# Patient Record
Sex: Female | Born: 1985
Health system: Southern US, Community
[De-identification: ages and names within clinical notes are randomized; demographics above are authoritative.]

## PROBLEM LIST (undated history)

## (undated) ENCOUNTER — Inpatient Hospital Stay (HOSPITAL_COMMUNITY): Payer: Self-pay

## (undated) DIAGNOSIS — O139 Gestational [pregnancy-induced] hypertension without significant proteinuria, unspecified trimester: Secondary | ICD-10-CM

## (undated) DIAGNOSIS — A599 Trichomoniasis, unspecified: Secondary | ICD-10-CM

## (undated) HISTORY — DX: Trichomoniasis, unspecified: A59.9

## (undated) HISTORY — PX: NO PAST SURGERIES: SHX2092

## (undated) HISTORY — DX: Gestational (pregnancy-induced) hypertension without significant proteinuria, unspecified trimester: O13.9

---
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
## 2006-03-05 ENCOUNTER — Emergency Department: Payer: Self-pay | Admitting: Emergency Medicine

## 2006-05-09 ENCOUNTER — Emergency Department: Payer: Self-pay | Admitting: General Practice

## 2006-05-11 ENCOUNTER — Emergency Department: Payer: Self-pay | Admitting: Unknown Physician Specialty

## 2006-06-25 ENCOUNTER — Emergency Department: Payer: Self-pay | Admitting: Emergency Medicine

## 2006-08-03 ENCOUNTER — Emergency Department: Payer: Self-pay | Admitting: Emergency Medicine

## 2006-09-09 ENCOUNTER — Emergency Department: Payer: Self-pay | Admitting: Emergency Medicine

## 2006-09-10 ENCOUNTER — Emergency Department: Payer: Self-pay | Admitting: Emergency Medicine

## 2007-07-06 ENCOUNTER — Emergency Department (HOSPITAL_COMMUNITY): Admission: EM | Admit: 2007-07-06 | Discharge: 2007-07-06 | Payer: Self-pay | Admitting: Emergency Medicine

## 2007-07-07 ENCOUNTER — Emergency Department: Payer: Self-pay | Admitting: Emergency Medicine

## 2008-02-17 ENCOUNTER — Emergency Department: Payer: Self-pay | Admitting: Emergency Medicine

## 2009-05-19 ENCOUNTER — Emergency Department: Payer: Self-pay | Admitting: Emergency Medicine

## 2009-09-06 ENCOUNTER — Emergency Department: Payer: Self-pay | Admitting: Emergency Medicine

## 2010-06-14 ENCOUNTER — Emergency Department: Payer: Self-pay | Admitting: Unknown Physician Specialty

## 2011-05-20 ENCOUNTER — Emergency Department: Payer: Self-pay | Admitting: Emergency Medicine

## 2011-12-01 ENCOUNTER — Emergency Department (HOSPITAL_COMMUNITY)
Admission: EM | Admit: 2011-12-01 | Discharge: 2011-12-02 | Disposition: A | Discharge status: Home / Self Care | Payer: Self-pay | Attending: Emergency Medicine | Admitting: Emergency Medicine

## 2011-12-01 ENCOUNTER — Encounter: Payer: Self-pay | Admitting: Emergency Medicine

## 2011-12-01 DIAGNOSIS — J45909 Unspecified asthma, uncomplicated: Secondary | ICD-10-CM | POA: Insufficient documentation

## 2011-12-01 DIAGNOSIS — R21 Rash and other nonspecific skin eruption: Secondary | ICD-10-CM | POA: Insufficient documentation

## 2011-12-01 DIAGNOSIS — H6092 Unspecified otitis externa, left ear: Secondary | ICD-10-CM

## 2011-12-01 DIAGNOSIS — H9209 Otalgia, unspecified ear: Secondary | ICD-10-CM | POA: Insufficient documentation

## 2011-12-01 DIAGNOSIS — R509 Fever, unspecified: Secondary | ICD-10-CM | POA: Insufficient documentation

## 2011-12-01 DIAGNOSIS — H60399 Other infective otitis externa, unspecified ear: Secondary | ICD-10-CM | POA: Insufficient documentation

## 2011-12-01 NOTE — ED Notes (Signed)
PT. REPORTS LEFT EAR PAIN X 2 DAYS , DENIES INJURY , OCCASIONAL DRAINAGE .

## 2011-12-02 MED ORDER — CIPROFLOXACIN-DEXAMETHASONE 0.3-0.1 % OT SUSP: 4.0000 [drp] | Freq: Two times a day (BID) | OTIC | Status: AC

## 2011-12-02 MED ORDER — HYDROCODONE-ACETAMINOPHEN 7.5-325 MG/15ML PO SOLN: 15.0000 mL | ORAL | Status: AC | PRN

## 2011-12-02 MED ORDER — HYDROCODONE-ACETAMINOPHEN 5-325 MG PO TABS
1.0000 | ORAL_TABLET | Freq: Once | ORAL | Status: AC
  Administered 2011-12-02: 1 via ORAL
  Filled 2011-12-02: qty 1

## 2011-12-02 NOTE — ED Notes (Signed)
Rx given x2 D/c instructions reviewed w/ pt - pt denies any further questions or concerns at present.   

## 2011-12-06 NOTE — ED Provider Notes (Signed)
History     CSN: [id number] Arrival date & time: 12/01/2011 10:54 PM   First MD Initiated Contact with Patient 12/02/11 0202      Chief Complaint  Patient presents with  . Otalgia     Patient is a 25 y.o. female presenting with ear pain. The history is provided by the patient.  Otalgia This is a new problem. The current episode started 2 days ago. There is pain in the left ear. The problem occurs constantly. The problem has been gradually worsening. The maximum temperature recorded prior to her arrival was 100 to 100.9 F. The pain is at a severity of 8/10. The pain is moderate. Her past medical history does not include hearing loss.  Reports 2 day hx of (L) ear pain.   Past Medical History  Diagnosis Date  . Asthma     History reviewed. No pertinent past surgical history.  No family history on file.  History  Substance Use Topics  . Smoking status: Current Everyday Smoker  . Smokeless tobacco: Not on file  . Alcohol Use: No    OB History    Grav Para Term Preterm Abortions TAB SAB Ect Mult Living                  Review of Systems  Constitutional: Negative.   HENT: Positive for ear pain.   Eyes: Negative.   Respiratory: Negative.   Cardiovascular: Negative.   Gastrointestinal: Negative.   Genitourinary: Negative.   Musculoskeletal: Negative.   Skin: Negative.   Neurological: Negative.   Hematological: Negative.   Psychiatric/Behavioral: Negative.     Allergies  Sulfa antibiotics  Home Medications   Current Outpatient Rx  Name Route Sig Dispense Refill  . CIPROFLOXACIN-DEXAMETHASONE 0.3-0.1 % OT SUSP Left Ear Place 4 drops into the left ear 2 (two) times daily. For 7-10 days 7.5 mL 0  . HYDROCODONE-ACETAMINOPHEN 7.5-325 MG/15ML PO SOLN Oral Take 15 mLs by mouth every 4 (four) hours as needed for pain. 50 mL 0    BP 112/58  Pulse 66  Temp 97.9 F (36.6 C)  Resp 18  SpO2 100%  LMP 11/20/2011  Physical Exam  Constitutional: She is oriented to  person, place, and time. She appears well-developed and well-nourished.  HENT:  Head: Normocephalic and atraumatic.  Right Ear: Hearing, tympanic membrane, external ear and ear canal normal.  Left Ear: Hearing normal. Tympanic membrane is erythematous.  Eyes: Conjunctivae are normal.  Neck: Neck supple.  Cardiovascular: Normal rate and regular rhythm.   Pulmonary/Chest: Effort normal and breath sounds normal.  Abdominal: Soft. Bowel sounds are normal.  Musculoskeletal: Normal range of motion.  Neurological: She is alert and oriented to person, place, and time.  Skin: Skin is warm and dry. Rash noted. Rash is papular. No erythema.  Psychiatric: She has a normal mood and affect.    ED Course  Procedures Findings and impression discussed w/ pt. Will plan for d/c hoem w/ tx for (L) OM and encourage close f/u w/ her PCP in [place] is not improving. Pt agreeable w/ plan.  Labs Reviewed - No data to display No results found.   1. Otitis externa of left ear       MDM  (L) OM        Leanne Chang, NP 12/06/11 [phone]

## 2011-12-08 NOTE — ED Provider Notes (Signed)
Medical screening examination/treatment/procedure(s) were performed by non-physician practitioner and as supervising physician I was immediately available for consultation/collaboration.   Shelda Jakes, MD 12/08/11 [phone]

## 2012-12-18 ENCOUNTER — Encounter (HOSPITAL_COMMUNITY): Payer: Self-pay | Admitting: Emergency Medicine

## 2012-12-18 ENCOUNTER — Emergency Department (HOSPITAL_COMMUNITY)
Admission: EM | Admit: 2012-12-18 | Discharge: 2012-12-18 | Disposition: A | Discharge status: Home / Self Care | Payer: Self-pay | Attending: Emergency Medicine | Admitting: Emergency Medicine

## 2012-12-18 DIAGNOSIS — L259 Unspecified contact dermatitis, unspecified cause: Secondary | ICD-10-CM | POA: Insufficient documentation

## 2012-12-18 DIAGNOSIS — R109 Unspecified abdominal pain: Secondary | ICD-10-CM | POA: Insufficient documentation

## 2012-12-18 DIAGNOSIS — F17200 Nicotine dependence, unspecified, uncomplicated: Secondary | ICD-10-CM | POA: Insufficient documentation

## 2012-12-18 DIAGNOSIS — J45909 Unspecified asthma, uncomplicated: Secondary | ICD-10-CM | POA: Insufficient documentation

## 2012-12-18 NOTE — ED Provider Notes (Signed)
History   This chart was scribed for Nelia Shi, MD, by Frederik Pear, ER scribe. The patient was seen in room TR06C/TR06C and the patient's care was started at 1105.    CSN: [id number]  Arrival date & time 12/18/12  1105   None     Chief Complaint  Patient presents with  . Rash    (Consider location/radiation/quality/duration/timing/severity/associated sxs/prior treatment) HPI  Christie Ali is a 26 y.o. female who presents to the Emergency Department complaining of a constant, gradually worsening rash with associated 10/10 itching and pain to the lower abdomen that has since radiated to the legs, back, and arms and began 7-8 months ago. She states that she saw her PCP several months ago and was diagnosed with an allergy to nickel. She states she has been treating the area with hydrocortisone cream with no relief. She has an appointment with a dermatologist in [place] in 03/14.   Past Medical History  Diagnosis Date  . Asthma     History reviewed. No pertinent past surgical history.  No family history on file.  History  Substance Use Topics  . Smoking status: Current Every Day Smoker  . Smokeless tobacco: Not on file  . Alcohol Use: No    OB History    Grav Para Term Preterm Abortions TAB SAB Ect Mult Living                  Review of Systems A complete 10 system review of systems was obtained and all systems are negative except as noted in the HPI and PMH.   Allergies  Other and Sulfa antibiotics  Home Medications  No current outpatient prescriptions on file.  BP 107/62  Pulse 73  Temp 97.5 F (36.4 C) (Oral)  Resp 18  SpO2 100%  Physical Exam  Nursing note and vitals reviewed. Constitutional: She is oriented to person, place, and time. She appears well-developed and well-nourished. No distress.  HENT:  Head: Normocephalic and atraumatic.  Eyes: Pupils are equal, round, and reactive to light.  Neck: Normal range of motion.    Cardiovascular: Normal rate and intact distal pulses.   Pulmonary/Chest: No respiratory distress.  Abdominal: Normal appearance. She exhibits no distension.  Musculoskeletal: Normal range of motion.  Neurological: She is alert and oriented to person, place, and time. No cranial nerve deficit.  Skin: Skin is warm and dry. Rash noted. Rash is maculopapular. Rash is not pustular and not urticarial.     Psychiatric: She has a normal mood and affect. Her behavior is normal.    ED Course  Procedures (including critical care time)  DIAGNOSTIC STUDIES: Oxygen Saturation is 100% on room air, normal by my interpretation.    COORDINATION OF CARE:  11:33- Discussed planned course of treatment with the patient, including nightly dressings and continuation of the hydrocortisone cream, until she can see the dermatologist, who is agreeable at this time.   Labs Reviewed - No data to display No results found.   1. Contact dermatitis       MDM  May be contact from metal in jeans.  Will recommend to wear sweatpants only and use occlusive dressing      Nelia Shi, MD 12/18/12 2216

## 2012-12-18 NOTE — ED Notes (Signed)
Dry, patchy rash to abdomen, back and legs. Has appointment with Derm in March.

## 2012-12-18 NOTE — ED Notes (Signed)
Onset 6 months ago seen Primary Doctor for rash on abdominal area and now radiating to legs back and arms.  Stated Doctor told patient allergy to nickel given medication however not resolving. Itching 10/10 and soreness.

## 2012-12-18 NOTE — Discharge Instructions (Signed)
Use occlusive dressings as I directed every night   Contact Dermatitis Contact dermatitis is a reaction to certain substances that touch the skin. Contact dermatitis can be either irritant contact dermatitis or allergic contact dermatitis. Irritant contact dermatitis does not require previous exposure to the substance for a reaction to occur.Allergic contact dermatitis only occurs if you have been exposed to the substance before. Upon a repeat exposure, your body reacts to the substance.  CAUSES  Many substances can cause contact dermatitis. Irritant dermatitis is most commonly caused by repeated exposure to mildly irritating substances, such as:  Makeup.  Soaps.  Detergents.  Bleaches.  Acids.  Metal salts, such as nickel. Allergic contact dermatitis is most commonly caused by exposure to:  Poisonous plants.  Chemicals (deodorants, shampoos).  Jewelry.  Latex.  Neomycin in triple antibiotic cream.  Preservatives in products, including clothing. SYMPTOMS  The area of skin that is exposed may develop:  Dryness or flaking.  Redness.  Cracks.  Itching.  Pain or a burning sensation.  Blisters. With allergic contact dermatitis, there may also be swelling in areas such as the eyelids, mouth, or genitals.  DIAGNOSIS  Your caregiver can usually tell what the problem is by doing a physical exam. In cases where the cause is uncertain and an allergic contact dermatitis is suspected, a patch skin test may be performed to help determine the cause of your dermatitis. TREATMENT Treatment includes protecting the skin from further contact with the irritating substance by avoiding that substance if possible. Barrier creams, powders, and gloves may be helpful. Your caregiver may also recommend:  Steroid creams or ointments applied 2 times daily. For best results, soak the rash area in cool water for 20 minutes. Then apply the medicine. Cover the area with a plastic wrap. You can  store the steroid cream in the refrigerator for a "chilly" effect on your rash. That may decrease itching. Oral steroid medicines may be needed in more severe cases.  Antibiotics or antibacterial ointments if a skin infection is present.  Antihistamine lotion or an antihistamine taken by mouth to ease itching.  Lubricants to keep moisture in your skin.  Burow's solution to reduce redness and soreness or to dry a weeping rash. Mix one packet or tablet of solution in 2 cups cool water. Dip a clean washcloth in the mixture, wring it out a bit, and put it on the affected area. Leave the cloth in place for 30 minutes. Do this as often as possible throughout the day.  Taking several cornstarch or baking soda baths daily if the area is too large to cover with a washcloth. Harsh chemicals, such as alkalis or acids, can cause skin damage that is like a burn. You should flush your skin for 15 to 20 minutes with cold water after such an exposure. You should also seek immediate medical care after exposure. Bandages (dressings), antibiotics, and pain medicine may be needed for severely irritated skin.  HOME CARE INSTRUCTIONS  Avoid the substance that caused your reaction.  Keep the area of skin that is affected away from hot water, soap, sunlight, chemicals, acidic substances, or anything else that would irritate your skin.  Do not scratch the rash. Scratching may cause the rash to become infected.  You may take cool baths to help stop the itching.  Only take over-the-counter or prescription medicines as directed by your caregiver.  See your caregiver for follow-up care as directed to make sure your skin is healing properly. SEEK MEDICAL CARE  IF:   Your condition is not better after 3 days of treatment.  You seem to be getting worse.  You see signs of infection such as swelling, tenderness, redness, soreness, or warmth in the affected area.  You have any problems related to your  medicines. Document Released: 12/03/2000 Document Revised: 02/28/2012 Document Reviewed: 05/11/2011 [facility] Patient Information 2013 [place], [place].

## 2014-01-18 ENCOUNTER — Emergency Department: Payer: Self-pay | Admitting: Emergency Medicine

## 2014-01-18 LAB — MONONUCLEOSIS SCREEN: Mono Test: NEGATIVE

## 2014-01-20 LAB — BETA STREP CULTURE(ARMC)

## 2014-03-20 ENCOUNTER — Emergency Department: Payer: Self-pay | Admitting: Internal Medicine

## 2014-05-26 ENCOUNTER — Emergency Department (HOSPITAL_COMMUNITY): Payer: Self-pay

## 2014-05-26 ENCOUNTER — Encounter (HOSPITAL_COMMUNITY): Payer: Self-pay | Admitting: Emergency Medicine

## 2014-05-26 ENCOUNTER — Emergency Department (HOSPITAL_COMMUNITY)
Admission: EM | Admit: 2014-05-26 | Discharge: 2014-05-26 | Disposition: A | Discharge status: Home / Self Care | Payer: Self-pay | Attending: Emergency Medicine | Admitting: Emergency Medicine

## 2014-05-26 DIAGNOSIS — J069 Acute upper respiratory infection, unspecified: Secondary | ICD-10-CM | POA: Insufficient documentation

## 2014-05-26 DIAGNOSIS — Z882 Allergy status to sulfonamides status: Secondary | ICD-10-CM | POA: Insufficient documentation

## 2014-05-26 DIAGNOSIS — F17200 Nicotine dependence, unspecified, uncomplicated: Secondary | ICD-10-CM | POA: Insufficient documentation

## 2014-05-26 DIAGNOSIS — J45901 Unspecified asthma with (acute) exacerbation: Secondary | ICD-10-CM | POA: Insufficient documentation

## 2014-05-26 LAB — RAPID STREP SCREEN (MED CTR MEBANE ONLY): STREPTOCOCCUS, GROUP A SCREEN (DIRECT): NEGATIVE

## 2014-05-26 MED ORDER — PREDNISONE 20 MG PO TABS: ORAL_TABLET | ORAL | Status: DC

## 2014-05-26 MED ORDER — HYDROCODONE-ACETAMINOPHEN 7.5-325 MG/15ML PO SOLN: 10.0000 mL | Freq: Four times a day (QID) | ORAL | Status: DC | PRN

## 2014-05-26 MED ORDER — ALBUTEROL SULFATE (2.5 MG/3ML) 0.083% IN NEBU
5.0000 mg | INHALATION_SOLUTION | Freq: Once | RESPIRATORY_TRACT | Status: AC
  Administered 2014-05-26: 5 mg via RESPIRATORY_TRACT
  Filled 2014-05-26: qty 6

## 2014-05-26 MED ORDER — ALBUTEROL SULFATE HFA 108 (90 BASE) MCG/ACT IN AERS
2.0000 | INHALATION_SPRAY | RESPIRATORY_TRACT | Status: DC | PRN
  Administered 2014-05-26: 2 via RESPIRATORY_TRACT
  Filled 2014-05-26: qty 6.7

## 2014-05-26 MED ORDER — HYDROCODONE-ACETAMINOPHEN 7.5-325 MG/15ML PO SOLN
10.0000 mL | Freq: Once | ORAL | Status: AC
  Administered 2014-05-26: 10 mL via ORAL
  Filled 2014-05-26: qty 15

## 2014-05-26 NOTE — ED Notes (Signed)
Per pt sts she has been congested and coughing the last few days. Productive cough. sts some fever.

## 2014-05-26 NOTE — ED Provider Notes (Signed)
CSN: [id number]     Arrival date & time 05/26/14  0735 History   First MD Initiated Contact with Patient 05/26/14 [phone]     Chief Complaint  Patient presents with  . URI  . Cough     (Consider location/radiation/quality/duration/timing/severity/associated sxs/prior Treatment) HPI  28 year old female with history of asthma who presents complaining of chest congestions and productive cough. Patient reports for the past 3-4 days she has had nasal congestion, sore throat, cough productive with yellow sputum and now having some increased shortness of breath and wheezing. She reports subjective fever and chills at home. Symptoms not improved with over-the-counter medication. No associated nausea vomiting diarrhea no abdominal pain no back pain no dysuria and no rash. She is an occasional smoker. She reports that asthma is well-controlled and has not had an exacerbation in for 5 years. No prior history of intubation and ICU stay. No prior history of PE DVT, recent surgery, prolonged bed rest, no history of cancer, no unilateral leg swelling or calf pain, and does not take birth control pill. Denies hemoptysis.   Past Medical History  Diagnosis Date  . Asthma    History reviewed. No pertinent past surgical history. History reviewed. No pertinent family history. History  Substance Use Topics  . Smoking status: Current Every Day Smoker  . Smokeless tobacco: Not on file  . Alcohol Use: No   OB History   Grav Para Term Preterm Abortions TAB SAB Ect Mult Living                 Review of Systems  All other systems reviewed and are negative.     Allergies  Sulfa antibiotics and Other  Home Medications   Prior to Admission medications   Medication Sig Start Date End Date Taking? Authorizing Provider  Phenylephrine-DM-GG-APAP (TYLENOL COLD/FLU SEVERE PO) Take 10 mLs by mouth every 4 (four) hours as needed (flu like symptoms).   Yes Historical Provider, MD   BP 114/73  Pulse 85   Temp(Src) 98.4 F (36.9 C)  Resp 20  Wt 160 lb (72.576 kg)  SpO2 98% Physical Exam  Nursing note and vitals reviewed. Constitutional: She appears well-developed and well-nourished. No distress.  HENT:  Head: Atraumatic.  Ears with normal TMs bilaterally, nose with normal nares, throat with uvula is midline, no tonsillar enlargement or exudates, mild posterior oropharyngeal erythema, no evidence of deep tissue infection, no trismus.    Eyes: Conjunctivae are normal.  Neck: Neck supple. No JVD present.  Cardiovascular:  Tachycardia without murmurs rubs or gallop  Pulmonary/Chest: She has no rales. She exhibits no tenderness.  Faint inspiratory wheezes without rales or rhonchi.  Abdominal: Soft. There is no tenderness.  Lymphadenopathy:    She has no cervical adenopathy.  Neurological: She is alert.  Skin: No rash noted.  Psychiatric: She has a normal mood and affect.    ED Course  Procedures (including critical care time)  8:56 AM Patient with URI symptoms which may exacerbate her asthma. She has very mild wheezes on exam which seems to improve after receiving breathing treatments. She is afebrile and not hypoxic. Chest x-ray ordered, strep test ordered, cough medication given.  11:31 AM Strep test is negative, chest x-ray shows no evidence of focal infiltrate concerning for pneumonia. Patient likely having an asthma exacerbation secondary to a URI. She is not hypoxic, and lung sounds much better after initial breathing treatment. Patient will be discharged with a course of steroids, cough medication and a  rescue inhaler to use as needed. Return precautions discussed. Patient voiced understanding and agrees with plan  Labs Review Labs Reviewed  RAPID STREP SCREEN  CULTURE, GROUP A STREP    Imaging Review Dg Chest 2 View (if Patient Has Fever And/or Copd)  05/26/2014   CLINICAL DATA:  Cough and upper respiratory infection.  EXAM: CHEST  2 VIEW  COMPARISON:  None.  FINDINGS: The  heart size and mediastinal contours are within normal limits. Both lungs are clear. The visualized skeletal structures are unremarkable.  IMPRESSION: No active cardiopulmonary disease.   Electronically Signed   By: Marin Olp M.D.   On: 05/26/2014 10:06     EKG Interpretation None      MDM   Final diagnoses:  Mild asthma exacerbation  URI (upper respiratory infection)    BP 125/71  Pulse 70  Temp(Src) 99 F (37.2 C) (Oral)  Resp 16  Wt 160 lb (72.576 kg)  SpO2 98%  I have reviewed nursing notes and vital signs. I personally reviewed the imaging tests through PACS system  I reviewed available ER/hospitalization records thought the EMR     Domenic Moras, [place] 05/26/14 1133

## 2014-05-26 NOTE — Discharge Instructions (Signed)
Please use your albuterol inhaler with 2 puffs every 4 hours as needed for shortness of breath or wheezing. Take Hycet for cough but be aware that this medication can cause drowsiness.  Take steroid as prescribed.  Avoid smoking cigarette.   Asthma, Acute Bronchospasm Acute bronchospasm caused by asthma is also referred to as an asthma attack. Bronchospasm means your air passages become narrowed. The narrowing is caused by inflammation and tightening of the muscles in the air tubes (bronchi) in your lungs. This can make it hard to breath or cause you to wheeze and cough. CAUSES Possible triggers are:  Animal dander from the skin, hair, or feathers of animals.  Dust mites contained in house dust.  Cockroaches.  Pollen from trees or grass.  Mold.  Cigarette or tobacco smoke.  Air pollutants such as dust, household cleaners, hair sprays, aerosol sprays, paint fumes, strong chemicals, or strong odors.  Cold air or weather changes. Cold air may trigger inflammation. Winds increase molds and pollens in the air.  Strong emotions such as crying or laughing hard.  Stress.  Certain medicines such as aspirin or beta-blockers.  Sulfites in foods and drinks, such as dried fruits and wine.  Infections or inflammatory conditions, such as a flu, cold, or inflammation of the nasal membranes (rhinitis).  Gastroesophageal reflux disease (GERD). GERD is a condition where stomach acid backs up into your throat (esophagus).  Exercise or strenuous activity. SIGNS AND SYMPTOMS   Wheezing.  Excessive coughing, particularly at night.  Chest tightness.  Shortness of breath. DIAGNOSIS  Your health care provider will ask you about your medical history and perform a physical exam. A chest X-ray or blood testing may be performed to look for other causes of your symptoms or other conditions that may have triggered your asthma attack. TREATMENT  Treatment is aimed at reducing inflammation and opening  up the airways in your lungs. Most asthma attacks are treated with inhaled medicines. These include quick relief or rescue medicines (such as bronchodilators) and controller medicines (such as inhaled corticosteroids). These medicines are sometimes given through an inhaler or a nebulizer. Systemic steroid medicine taken by mouth or given through an IV tube also can be used to reduce the inflammation when an attack is moderate or severe. Antibiotic medicines are only used if a bacterial infection is present.  HOME CARE INSTRUCTIONS   Rest.  Drink plenty of liquids. This helps the mucus to remain thin and be easily coughed up. Only use caffeine in moderation and do not use alcohol until you have recovered from your illness.  Do not smoke. Avoid being exposed to secondhand smoke.  You play a critical role in keeping yourself in good health. Avoid exposure to things that cause you to wheeze or to have breathing problems.  Keep your medicines up to date and available. Carefully follow your health care provider's treatment plan.  Take your medicine exactly as prescribed.  When pollen or pollution is bad, keep windows closed and use an air conditioner or go to places with air conditioning.  Asthma requires careful medical care. See your health care provider for a follow-up as advised. If you are more than [redacted] weeks pregnant and you were prescribed any new medicines, let your obstetrician know about the visit and how you are doing. Follow-up with your health care provider as directed.  After you have recovered from your asthma attack, make an appointment with your outpatient doctor to talk about ways to reduce the likelihood of future  attacks. If you do not have a doctor who manages your asthma, make an appointment with a primary care doctor to discuss your asthma. SEEK IMMEDIATE MEDICAL CARE IF:   You are getting worse.  You have trouble breathing. If severe, call your local emergency services (911  in the [place]  You develop chest pain or discomfort.  You are vomiting.  You are not able to keep fluids down.  You are coughing up yellow, green, brown, or bloody sputum.  You have a fever and your symptoms suddenly get worse.  You have trouble swallowing. MAKE SURE YOU:   Understand these instructions.  Will watch your condition.  Will get help right away if you are not doing well or get worse. Document Released: 03/23/2007 Document Revised: 08/08/2013 Document Reviewed: 06/13/2013 [facility] Patient Information 2014 [place], [place].  Upper Respiratory Infection, Adult An upper respiratory infection (URI) is also known as the common cold. It is often caused by a type of germ (virus). Colds are easily spread (contagious). You can pass it to others by kissing, coughing, sneezing, or drinking out of the same glass. Usually, you get better in 1 or 2 weeks.  HOME CARE   Only take medicine as told by your doctor.  Use a warm mist humidifier or breathe in steam from a hot shower.  Drink enough water and fluids to keep your pee (urine) clear or pale yellow.  Get plenty of rest.  Return to work when your temperature is back to normal or as told by your doctor. You may use a face mask and wash your hands to stop your cold from spreading. GET HELP RIGHT AWAY IF:   After the first few days, you feel you are getting worse.  You have questions about your medicine.  You have chills, shortness of breath, or brown or red spit (mucus).  You have yellow or brown snot (nasal discharge) or pain in the face, especially when you bend forward.  You have a fever, puffy (swollen) neck, pain when you swallow, or white spots in the back of your throat.  You have a bad headache, ear pain, sinus pain, or chest pain.  You have a high-pitched whistling sound when you breathe in and out (wheezing).  You have a lasting cough or cough up blood.  You have sore muscles or a stiff neck. MAKE SURE  YOU:   Understand these instructions.  Will watch your condition.  Will get help right away if you are not doing well or get worse. Document Released: 05/24/2008 Document Revised: 02/28/2012 Document Reviewed: 04/12/2011 [facility] Patient Information 2014 [place], [place].

## 2014-05-27 NOTE — ED Provider Notes (Signed)
Medical screening examination/treatment/procedure(s) were performed by non-physician practitioner and as supervising physician I was immediately available for consultation/collaboration.  Neta Ehlers, MD 05/27/14 [phone]

## 2014-05-28 LAB — CULTURE, GROUP A STREP

## 2014-08-03 ENCOUNTER — Emergency Department: Payer: Self-pay | Admitting: Emergency Medicine

## 2014-08-03 LAB — COMPREHENSIVE METABOLIC PANEL
ALBUMIN: 4.1 g/dL (ref 3.4–5.0)
ALK PHOS: 68 U/L
ANION GAP: 9 (ref 7–16)
BUN: 9 mg/dL (ref 7–18)
Bilirubin,Total: 2.1 mg/dL — ABNORMAL HIGH (ref 0.2–1.0)
CALCIUM: 8.8 mg/dL (ref 8.5–10.1)
Chloride: 107 mmol/L (ref 98–107)
Co2: 25 mmol/L (ref 21–32)
Creatinine: 1.23 mg/dL (ref 0.60–1.30)
GLUCOSE: 88 mg/dL (ref 65–99)
OSMOLALITY: 279 (ref 275–301)
POTASSIUM: 3.8 mmol/L (ref 3.5–5.1)
SGOT(AST): 18 U/L (ref 15–37)
SGPT (ALT): 14 U/L
Sodium: 141 mmol/L (ref 136–145)
Total Protein: 8.1 g/dL (ref 6.4–8.2)

## 2014-08-03 LAB — CBC WITH DIFFERENTIAL/PLATELET
BASOS ABS: 0.1 10*3/uL (ref 0.0–0.1)
BASOS PCT: 0.9 %
Eosinophil #: 0.3 10*3/uL (ref 0.0–0.7)
Eosinophil %: 3.1 %
HCT: 46 % (ref 35.0–47.0)
HGB: 15.2 g/dL (ref 12.0–16.0)
LYMPHS ABS: 2.8 10*3/uL (ref 1.0–3.6)
LYMPHS PCT: 28.4 %
MCH: 32.2 pg (ref 26.0–34.0)
MCHC: 33 g/dL (ref 32.0–36.0)
MCV: 98 fL (ref 80–100)
MONO ABS: 0.9 x10 3/mm (ref 0.2–0.9)
Monocyte %: 9.1 %
NEUTROS ABS: 5.8 10*3/uL (ref 1.4–6.5)
NEUTROS PCT: 58.5 %
PLATELETS: 229 10*3/uL (ref 150–440)
RBC: 4.72 10*6/uL (ref 3.80–5.20)
RDW: 12.7 % (ref 11.5–14.5)
WBC: 10 10*3/uL (ref 3.6–11.0)

## 2014-08-03 LAB — URINALYSIS, COMPLETE
Bilirubin,UR: NEGATIVE
Blood: NEGATIVE
Glucose,UR: NEGATIVE mg/dL (ref 0–75)
NITRITE: NEGATIVE
PH: 5 (ref 4.5–8.0)
Protein: 30
RBC,UR: 24 /HPF (ref 0–5)
SPECIFIC GRAVITY: 1.024 (ref 1.003–1.030)

## 2014-08-03 LAB — PREGNANCY, URINE: PREGNANCY TEST, URINE: NEGATIVE m[IU]/mL

## 2015-01-23 ENCOUNTER — Emergency Department (HOSPITAL_COMMUNITY)
Admission: EM | Admit: 2015-01-23 | Discharge: 2015-01-23 | Disposition: A | Discharge status: Home / Self Care | Payer: Self-pay | Attending: Emergency Medicine | Admitting: Emergency Medicine

## 2015-01-23 DIAGNOSIS — L03115 Cellulitis of right lower limb: Secondary | ICD-10-CM | POA: Insufficient documentation

## 2015-01-23 DIAGNOSIS — Z720 Tobacco use: Secondary | ICD-10-CM | POA: Insufficient documentation

## 2015-01-23 DIAGNOSIS — J45909 Unspecified asthma, uncomplicated: Secondary | ICD-10-CM | POA: Insufficient documentation

## 2015-01-23 MED ORDER — DOXYCYCLINE HYCLATE 100 MG PO CAPS: 100.0000 mg | ORAL_CAPSULE | Freq: Two times a day (BID) | ORAL | Status: DC

## 2015-01-23 MED ORDER — HYDROCODONE-ACETAMINOPHEN 5-325 MG PO TABS: 1.0000 | ORAL_TABLET | Freq: Four times a day (QID) | ORAL | Status: DC | PRN

## 2015-01-23 NOTE — ED Notes (Signed)
Pt presents with redness, swelling and warmth to right knee x1 day. Raised center. Denies injury or bite.

## 2015-01-23 NOTE — ED Provider Notes (Signed)
CSN: [id number]     Arrival date & time 01/23/15  0741 History   First MD Initiated Contact with Patient 01/23/15 [phone]     Chief Complaint  Patient presents with  . Insect Bite     (Consider location/radiation/quality/duration/timing/severity/associated sxs/prior Treatment) HPI  Patient presents to the emergency department with redness to the area just above her knee.  The patient states that she thinks that the area may have been a little red and inflamed, but hit her knee at work yesterday and that is when she noticed more pain.  Patient states that she has not had any fever, nausea, vomiting, weakness, dizziness, headache, blurred vision, back pain, neck pain or syncope.  The patient states that she did not take any medications prior to arrival.  She did not use any treatments on the area Past Medical History  Diagnosis Date  . Asthma    No past surgical history on file. No family history on file. History  Substance Use Topics  . Smoking status: Current Every Day Smoker  . Smokeless tobacco: Not on file  . Alcohol Use: No   OB History    No data available     Review of Systems All other systems negative except as documented in the HPI. All pertinent positives and negatives as reviewed in the HPI.    Allergies  Sulfa antibiotics and Other  Home Medications   Prior to Admission medications   Medication Sig Start Date End Date Taking? Authorizing Provider  acetaminophen (TYLENOL) 325 MG tablet Take 650 mg by mouth every 6 (six) hours as needed for mild pain or headache.   Yes Historical Provider, MD  medroxyPROGESTERone (DEPO-PROVERA) 150 MG/ML injection Inject 150 mg into the muscle every 3 (three) months.   Yes Historical Provider, MD  HYDROcodone-acetaminophen (HYCET) 7.5-325 mg/15 ml solution Take 10 mLs by mouth 4 (four) times daily as needed for moderate pain. Patient not taking: Reported on 01/23/2015 05/26/14   Domenic Moras, PA-C  predniSONE (DELTASONE) 20 MG tablet 3  tabs po day one, then 2 tabs daily x 4 days Patient not taking: Reported on 01/23/2015 05/26/14   Domenic Moras, PA-C   BP 122/67 mmHg  Pulse 60  Temp(Src) 98.2 F (36.8 C) (Oral)  Resp 20  SpO2 97% Physical Exam  Constitutional: She is oriented to person, place, and time. She appears well-developed and well-nourished. No distress.  HENT:  Head: Normocephalic and atraumatic.  Eyes: Pupils are equal, round, and reactive to light.  Cardiovascular: Normal rate, regular rhythm and intact distal pulses.  Exam reveals no gallop and no friction rub.   No murmur heard. Pulmonary/Chest: Effort normal and breath sounds normal.  Neurological: She is alert and oriented to person, place, and time. She exhibits normal muscle tone. Coordination normal.  Skin: Skin is warm and dry.     Nursing note and vitals reviewed.   ED Course  Procedures (including critical care time) Return here as needed. The patient will be treated for cellulitis and told to return here for any worsening in your condition. Use ice and heat on the area.   MDM   Final diagnoses:  None      Brent General, PA-C 01/23/15 2440  Evelina Bucy, MD 01/25/15 [phone]

## 2015-01-23 NOTE — ED Notes (Signed)
Pt alert x4 respirations easy non labored skin w/d

## 2015-01-23 NOTE — Discharge Instructions (Signed)
Return here as needed. Follow up with a primary doctor. Use heat on the area.

## 2015-08-06 ENCOUNTER — Encounter (HOSPITAL_COMMUNITY): Payer: Self-pay | Admitting: Emergency Medicine

## 2015-08-06 ENCOUNTER — Emergency Department (HOSPITAL_COMMUNITY)
Admission: EM | Admit: 2015-08-06 | Discharge: 2015-08-06 | Disposition: A | Discharge status: Home / Self Care | Payer: BLUE CROSS/BLUE SHIELD | Attending: Emergency Medicine | Admitting: Emergency Medicine

## 2015-08-06 DIAGNOSIS — Y92007 Garden or yard of unspecified non-institutional (private) residence as the place of occurrence of the external cause: Secondary | ICD-10-CM | POA: Insufficient documentation

## 2015-08-06 DIAGNOSIS — J45909 Unspecified asthma, uncomplicated: Secondary | ICD-10-CM | POA: Diagnosis not present

## 2015-08-06 DIAGNOSIS — Z3202 Encounter for pregnancy test, result negative: Secondary | ICD-10-CM | POA: Insufficient documentation

## 2015-08-06 DIAGNOSIS — S90862A Insect bite (nonvenomous), left foot, initial encounter: Secondary | ICD-10-CM | POA: Insufficient documentation

## 2015-08-06 DIAGNOSIS — Z793 Long term (current) use of hormonal contraceptives: Secondary | ICD-10-CM | POA: Diagnosis not present

## 2015-08-06 DIAGNOSIS — Y9389 Activity, other specified: Secondary | ICD-10-CM | POA: Insufficient documentation

## 2015-08-06 DIAGNOSIS — Z720 Tobacco use: Secondary | ICD-10-CM | POA: Diagnosis not present

## 2015-08-06 DIAGNOSIS — W57XXXA Bitten or stung by nonvenomous insect and other nonvenomous arthropods, initial encounter: Secondary | ICD-10-CM | POA: Diagnosis not present

## 2015-08-06 DIAGNOSIS — Z792 Long term (current) use of antibiotics: Secondary | ICD-10-CM | POA: Insufficient documentation

## 2015-08-06 DIAGNOSIS — G44209 Tension-type headache, unspecified, not intractable: Secondary | ICD-10-CM | POA: Insufficient documentation

## 2015-08-06 DIAGNOSIS — Y999 Unspecified external cause status: Secondary | ICD-10-CM | POA: Diagnosis not present

## 2015-08-06 LAB — POC URINE PREG, ED: PREG TEST UR: NEGATIVE

## 2015-08-06 MED ORDER — KETOROLAC TROMETHAMINE 60 MG/2ML IM SOLN
30.0000 mg | Freq: Once | INTRAMUSCULAR | Status: AC
  Administered 2015-08-06: 30 mg via INTRAMUSCULAR
  Filled 2015-08-06: qty 2

## 2015-08-06 MED ORDER — BUTALBITAL-APAP-CAFFEINE 50-325-40 MG PO TABS: 1.0000 | ORAL_TABLET | Freq: Four times a day (QID) | ORAL | Status: AC | PRN

## 2015-08-06 MED ORDER — DOXYCYCLINE HYCLATE 100 MG PO CAPS: 100.0000 mg | ORAL_CAPSULE | Freq: Two times a day (BID) | ORAL | Status: DC

## 2015-08-06 NOTE — ED Notes (Signed)
Pt states she has had a migraine for 4 to 5 days, she states has been taking ibuprofen 800 mg for pain. She took took it this morning at 0300 a.m.Marland Kitchen She also c/o proximal toes itching, swelling and painful. She states she had a tick bite 2 weeks ago.

## 2015-08-06 NOTE — ED Provider Notes (Signed)
CSN: [id number]     Arrival date & time 08/06/15  0929 History  This chart was scribed for non-physician practitioner, Domenic Moras, PA-C, working with Noemi Chapel, MD by Ladene Artist, ED Scribe. This patient was seen in room TR08C/TR08C and the patient's care was started at 10:22 AM.   Chief Complaint  Patient presents with  . Migraine   The history is provided by the patient. No language interpreter was used.   HPI Comments: Christie Ali is a 29 y.o. female who presents to the Emergency Department complaining of  migraine for the past 4-5 days. Pt describes pain as a constant, throbbing sensation with associated photophobia. She reports migraines every day upon waking for the past 4-5 days that lasts 3- minutes-1 hour. Pt went to work this morning but left after nausea and 1 episode of emesis. She has tried ibuprofen and rest without significant relief. Pt denies neck stiffness, rash, abdominal pain. She denies possibility of pregnancy; recently started depo at the beginning of August. Pt has seen her PCP Sabino Snipes, MD with [facility] for frequent HAs.   Pt also presents with a tick bite 2 weeks ago to left foot while doing yard work. She noticed a tick embedded between her left second and third toe the next morning. Pt was able to remove the tick. She also reports multiple mosquito bites to her left toes a few days later and has noticed associated swelling to the mosquito bites this morning. No treatments tried PTA.    Past Medical History  Diagnosis Date  . Asthma    History reviewed. No pertinent past surgical history. History reviewed. No pertinent family history. Social History  Substance Use Topics  . Smoking status: Current Every Day Smoker  . Smokeless tobacco: None  . Alcohol Use: No   OB History    No data available     Review of Systems  Eyes: Positive for photophobia.  Gastrointestinal: Positive for nausea and vomiting. Negative for abdominal pain.   Musculoskeletal: Negative for neck stiffness.  Skin: Negative for rash.  Neurological: Positive for headaches.   Allergies  Sulfa antibiotics and Other  Home Medications   Prior to Admission medications   Medication Sig Start Date End Date Taking? Authorizing Provider  acetaminophen (TYLENOL) 325 MG tablet Take 650 mg by mouth every 6 (six) hours as needed for mild pain or headache.    Historical Provider, MD  doxycycline (VIBRAMYCIN) 100 MG capsule Take 1 capsule (100 mg total) by mouth 2 (two) times daily. 01/23/15   Dalia Heading, PA-C  HYDROcodone-acetaminophen (NORCO/VICODIN) 5-325 MG per tablet Take 1 tablet by mouth every 6 (six) hours as needed for moderate pain. 01/23/15   Dalia Heading, PA-C  medroxyPROGESTERone (DEPO-PROVERA) 150 MG/ML injection Inject 150 mg into the muscle every 3 (three) months.    Historical Provider, MD  predniSONE (DELTASONE) 20 MG tablet 3 tabs po day one, then 2 tabs daily x 4 days Patient not taking: Reported on 01/23/2015 05/26/14   Domenic Moras, PA-C   BP 94/62 mmHg  Pulse 74  Temp(Src) 98.4 F (36.9 C) (Oral)  Resp 22  Wt 150 lb (68.04 kg)  SpO2 97%  LMP  Physical Exam  Constitutional: She is oriented to person, place, and time. She appears well-developed and well-nourished. No distress.  HENT:  Head: Normocephalic and atraumatic.  Eyes: Conjunctivae and EOM are normal.  Neck: Neck supple. No rigidity. No tracheal deviation present.  No nuchal rigidity  Cardiovascular:  Normal rate.   Pulses:      Dorsalis pedis pulses are 2+ on the left side.  Pulmonary/Chest: Effort normal. No respiratory distress.  Musculoskeletal: Normal range of motion.  L foot: mild erythema noted between web space of 2nd and 3rd toes with mild tenderness. No abscess. Pedal pulse 2+. Normal L ankle.  Neurological: She is alert and oriented to person, place, and time. She has normal strength. No cranial nerve deficit or sensory deficit. GCS eye subscore is 4. GCS  verbal subscore is 5. GCS motor subscore is 6.  No focal neuro deficit. Cranial nerves 3-12 grossly intact. GCS score is 15.   Skin: Skin is warm and dry.  Psychiatric: She has a normal mood and affect. Her behavior is normal.  Nursing note and vitals reviewed.  ED Course  Procedures (including critical care time) DIAGNOSTIC STUDIES: Oxygen Saturation is 97% on RA, normal by my interpretation.    COORDINATION OF CARE: 10:29 AM-Discussed treatment plan which includes Toradol injection with pt at bedside and pt agreed to plan.   11:40 AM Tick bite 2 weeks ago, here with headache and also rash at the site of the bite on L foot.  No erythema migran concerning for Lyme disease.  No focal neuro deficit on exam.  However, given risk factors of potential tick bite vector, pt will be receiving doxy.  Pt to f/u with PCP  Labs Review Labs Reviewed - No data to display  Imaging Review No results found. I have personally reviewed and evaluated these images and lab results as part of my medical decision-making.   EKG Interpretation None      MDM   Final diagnoses:  Tick bite of left foot, initial encounter  Tension-type headache, not intractable, unspecified chronicity pattern    BP 94/62 mmHg  Pulse 74  Temp(Src) 98.4 F (36.9 C) (Oral)  Resp 22  Wt 150 lb (68.04 kg)  SpO2 97%  LMP    I personally performed the services described in this documentation, which was scribed in my presence. The recorded information has been reviewed and is accurate.     Domenic Moras, PA-C 08/06/15 1141  Noemi Chapel, MD 08/06/15 2250

## 2015-08-06 NOTE — Discharge Instructions (Signed)
Tick Bite Information Ticks are insects that attach themselves to the skin. There are many types of ticks. Common types include wood ticks and deer ticks. Sometimes, ticks carry diseases that can make a person very ill. The most common places for ticks to attach themselves are the scalp, neck, armpits, waist, and groin.  HOW CAN YOU PREVENT TICK BITES? Take these steps to help prevent tick bites when you are outdoors:  Wear long sleeves and long pants.  Wear white clothes so you can see ticks more easily.  Tuck your pant legs into your socks.  If walking on a trail, stay in the middle of the trail to avoid brushing against bushes.  Avoid walking through areas with long grass.  Put bug spray on all skin that is showing and along boot tops, pant legs, and sleeve cuffs.  Check clothes, hair, and skin often and before going inside.  Brush off any ticks that are not attached.  Take a shower or bath as soon as possible after being outdoors. HOW SHOULD YOU REMOVE A TICK? Ticks should be removed as soon as possible to help prevent diseases. 1. If latex gloves are available, put them on before trying to remove a tick. 2. Use tweezers to grasp the tick as close to the skin as possible. You may also use curved forceps or a tick removal tool. Grasp the tick as close to its head as possible. Avoid grasping the tick on its body. 3. Pull gently upward until the tick lets go. Do not twist the tick or jerk it suddenly. This may break off the tick's head or mouth parts. 4. Do not squeeze or crush the tick's body. This could force disease-carrying fluids from the tick into your body. 5. After the tick is removed, wash the bite area and your hands with soap and water or alcohol. 6. Apply a small amount of antiseptic cream or ointment to the bite site. 7. Wash any tools that were used. Do not try to remove a tick by applying a hot match, petroleum jelly, or fingernail polish to the tick. These methods do  not work. They may also increase the chances of disease being spread from the tick bite. WHEN SHOULD YOU SEEK HELP? Contact your health care provider if you are unable to remove a tick or if a part of the tick breaks off in the skin. After a tick bite, you need to watch for signs and symptoms of diseases that can be spread by ticks. Contact your health care provider if you develop any of the following:  Fever.  Rash.  Redness and puffiness (swelling) in the area of the tick bite.  Tender, puffy lymph glands.  Watery poop (diarrhea).  Weight loss.  Cough.  Feeling more tired than normal (fatigue).  Muscle, joint, or bone pain.  Belly (abdominal) pain.  Headache.  Change in your level of consciousness.  Trouble walking or moving your legs.  Loss of feeling (numbness) in the legs.  Loss of movement (paralysis).  Shortness of breath.  Confusion.  Throwing up (vomiting) many times. Document Released: 03/02/2010 Document Revised: 08/08/2013 Document Reviewed: 05/16/2013 Exit[employer] Patient Information 2015 [employer]. This information is not intended to replace advice given to you by your health care provider. Make sure you discuss any questions you have with your health care provider.  

## 2016-05-25 ENCOUNTER — Emergency Department
Admission: EM | Admit: 2016-05-25 | Discharge: 2016-05-25 | Disposition: A | Discharge status: Home / Self Care | Payer: BLUE CROSS/BLUE SHIELD | Attending: Emergency Medicine | Admitting: Emergency Medicine

## 2016-05-25 DIAGNOSIS — F17200 Nicotine dependence, unspecified, uncomplicated: Secondary | ICD-10-CM | POA: Insufficient documentation

## 2016-05-25 DIAGNOSIS — Z79899 Other long term (current) drug therapy: Secondary | ICD-10-CM | POA: Insufficient documentation

## 2016-05-25 DIAGNOSIS — X58XXXA Exposure to other specified factors, initial encounter: Secondary | ICD-10-CM | POA: Insufficient documentation

## 2016-05-25 DIAGNOSIS — Y939 Activity, unspecified: Secondary | ICD-10-CM | POA: Insufficient documentation

## 2016-05-25 DIAGNOSIS — Y999 Unspecified external cause status: Secondary | ICD-10-CM | POA: Insufficient documentation

## 2016-05-25 DIAGNOSIS — J45909 Unspecified asthma, uncomplicated: Secondary | ICD-10-CM | POA: Insufficient documentation

## 2016-05-25 DIAGNOSIS — Y929 Unspecified place or not applicable: Secondary | ICD-10-CM | POA: Insufficient documentation

## 2016-05-25 DIAGNOSIS — S0501XA Injury of conjunctiva and corneal abrasion without foreign body, right eye, initial encounter: Secondary | ICD-10-CM

## 2016-05-25 MED ORDER — FLUORESCEIN SODIUM 1 MG OP STRP
ORAL_STRIP | OPHTHALMIC | Status: AC
  Filled 2016-05-25: qty 1

## 2016-05-25 MED ORDER — HYDROCODONE-ACETAMINOPHEN 5-325 MG PO TABS: 1.0000 | ORAL_TABLET | ORAL | Status: DC | PRN

## 2016-05-25 MED ORDER — TOBRAMYCIN 0.3 % OP SOLN: 1.0000 [drp] | OPHTHALMIC | Status: AC

## 2016-05-25 MED ORDER — EYE WASH OPHTH SOLN
OPHTHALMIC | Status: AC
  Filled 2016-05-25: qty 118

## 2016-05-25 MED ORDER — TETRACAINE HCL 0.5 % OP SOLN
1.0000 [drp] | Freq: Once | OPHTHALMIC | Status: DC
  Filled 2016-05-25: qty 2

## 2016-05-25 NOTE — ED Notes (Signed)
Poked in eye with a finger 2 days ago.  Reports vision is blurry in right eye.

## 2016-05-25 NOTE — Discharge Instructions (Signed)
Corneal Abrasion °The cornea is the clear covering at the front and center of the eye. When you look at the colored portion of the eye, you are looking through the cornea. It is a thin tissue made up of layers. The top layer is the most sensitive layer. A corneal abrasion happens if this layer is scratched or an injury causes it to come off.  °HOME CARE °· You may be given drops or a medicated cream. Use the medicine as told by your doctor. °· A pressure patch may be put over the eye. If this is done, follow your doctor's instructions for when to remove the patch. Do not drive or use machines while the eye patch is on. Judging distances is hard to do with a patch on. °· See your doctor for a follow-up exam if you are told to do so. It is very important that you keep this appointment. °GET HELP IF:  °· You have pain, are sensitive to light, and have a scratchy feeling in one eye or both eyes. °· Your pressure patch keeps getting loose. You can blink your eye under the patch. °· You have fluid coming from your eye or the lids stick together in the morning. °· You have the same symptoms in the morning that you did with the first abrasion. This could be days, weeks, or months after the first abrasion healed. °  °This information is not intended to replace advice given to you by your health care provider. Make sure you discuss any questions you have with your health care provider. °  °Document Released: 05/24/2008 Document Revised: 08/27/2015 Document Reviewed: 08/13/2013 °Elsevier Interactive Patient Education ©2016 [employer] ° °

## 2016-05-25 NOTE — ED Provider Notes (Signed)
[facility] Emergency Department Provider Note  ____________________________________________  Time seen: Approximately 10:21 AM  I have reviewed the triage vital signs and the nursing notes.   HISTORY  Chief Complaint Eye Pain    HPI Christie Ali is a 30 y.o. female presents for evaluation of right eye pain after being poked in the eye 2 days ago. Patient reports that her eyes extremely painful to look at the light. It is becoming red and tearful for the last 2 days.   Past Medical History  Diagnosis Date  . Asthma     There are no active problems to display for this patient.   No past surgical history on file.  Current Outpatient Rx  Name  Route  Sig  Dispense  Refill  . acetaminophen (TYLENOL) 325 MG tablet   Oral   Take 650 mg by mouth every 6 (six) hours as needed for mild pain or headache.         . butalbital-acetaminophen-caffeine (FIORICET) 50-325-40 MG per tablet   Oral   Take 1-2 tablets by mouth every 6 (six) hours as needed for headache.   20 tablet   0   . doxycycline (VIBRAMYCIN) 100 MG capsule   Oral   Take 1 capsule (100 mg total) by mouth 2 (two) times daily.   20 capsule   0   . HYDROcodone-acetaminophen (NORCO) 5-325 MG tablet   Oral   Take 1-2 tablets by mouth every 4 (four) hours as needed for moderate pain.   15 tablet   0   . medroxyPROGESTERone (DEPO-PROVERA) 150 MG/ML injection   Intramuscular   Inject 150 mg into the muscle every 3 (three) months.         . tobramycin (TOBREX) 0.3 % ophthalmic solution   Both Eyes   Place 1 drop into both eyes every 4 (four) hours.   5 mL   0     Allergies Sulfa antibiotics and Other  No family history on file.  Social History Social History  Substance Use Topics  . Smoking status: Current Every Day Smoker  . Smokeless tobacco: Not on file  . Alcohol Use: No    Review of Systems Constitutional: No fever/chills Eyes: Decreased vision right eye.  Positive for right eye pain. Skin: Negative for rash. Neurological: Negative for headaches, focal weakness or numbness.  10-point ROS otherwise negative.  ____________________________________________   PHYSICAL EXAM:  VITAL SIGNS: ED Triage Vitals  Enc Vitals Group     BP 05/25/16 0957 108/72 mmHg     Pulse Rate 05/25/16 0957 89     Resp 05/25/16 0957 18     Temp 05/25/16 0957 98.3 F (36.8 C)     Temp Source 05/25/16 0957 Oral     SpO2 05/25/16 0957 100 %     Weight 05/25/16 0957 130 lb (58.968 kg)     Height 05/25/16 0957 5\' 7"  (1.702 m)     Head Cir --      Peak Flow --      Pain Score 05/25/16 0956 7     Pain Loc --      Pain Edu? --      Excl. in [place]? --     Constitutional: Alert and oriented. Well appearing and in no acute distress. Eyes: Right conjunctival very erythematous and tearing. Nose equal round reactive to light and accommodation. Head: Atraumatic.  Neurologic:  Normal speech and language. No gross focal neurologic deficits are appreciated. No gait  instability. Skin:  Skin is warm, dry and intact. No rash noted. Psychiatric: Mood and affect are normal. Speech and behavior are normal.  ____________________________________________   LABS (all labs ordered are listed, but only abnormal results are displayed)  Labs Reviewed - No data to display ____________________________________________  EKG   ____________________________________________  RADIOLOGY   ____________________________________________   PROCEDURES  Procedure(s) performed: yes  Tetracaine eyedrops placed in the right eye. Fluorescein strip used. Visualized patient's cornea with a abrasion noted at about 7:00 on the right outer aspect of the right eye. Eye irrigated with 20 cc of normal saline. She tolerated procedure well with no complaints.  Critical Care performed: No  ____________________________________________   INITIAL IMPRESSION / ASSESSMENT AND PLAN / ED  COURSE  Pertinent labs & imaging results that were available during my care of the patient were reviewed by me and considered in my medical decision making (see chart for details).  Acute corneal abrasion right eye. Rx given for Tobrex and Vicodin for pain. Patient follow-up with PCP or return to ER for any worsening symptomology. ____________________________________________   FINAL CLINICAL IMPRESSION(S) / ED DIAGNOSES  Final diagnoses:  Corneal abrasion, right, initial encounter     This chart was dictated using voice recognition software/Dragon. Despite best efforts to proofread, errors can occur which can change the meaning. Any change was purely unintentional.   Arlyss Repress, PA-C 05/25/16 [place], MD 05/26/16 2204

## 2016-05-25 NOTE — ED Notes (Signed)
Pt to ed with c/o right eye pain, redness and swelling x 2 days.  Also noted redness to left eye although right eye is much worse.  Pt reports drainage out of right eye this am.

## 2016-09-16 ENCOUNTER — Encounter: Payer: Self-pay | Admitting: Emergency Medicine

## 2016-09-16 ENCOUNTER — Emergency Department
Admission: EM | Admit: 2016-09-16 | Discharge: 2016-09-16 | Disposition: A | Discharge status: Home / Self Care | Payer: BLUE CROSS/BLUE SHIELD | Attending: Emergency Medicine | Admitting: Emergency Medicine

## 2016-09-16 ENCOUNTER — Emergency Department: Payer: BLUE CROSS/BLUE SHIELD

## 2016-09-16 DIAGNOSIS — R590 Localized enlarged lymph nodes: Secondary | ICD-10-CM | POA: Insufficient documentation

## 2016-09-16 DIAGNOSIS — D72829 Elevated white blood cell count, unspecified: Secondary | ICD-10-CM | POA: Insufficient documentation

## 2016-09-16 DIAGNOSIS — Z79899 Other long term (current) drug therapy: Secondary | ICD-10-CM | POA: Insufficient documentation

## 2016-09-16 DIAGNOSIS — R52 Pain, unspecified: Secondary | ICD-10-CM

## 2016-09-16 DIAGNOSIS — R0602 Shortness of breath: Secondary | ICD-10-CM | POA: Insufficient documentation

## 2016-09-16 DIAGNOSIS — J45909 Unspecified asthma, uncomplicated: Secondary | ICD-10-CM | POA: Insufficient documentation

## 2016-09-16 DIAGNOSIS — R05 Cough: Secondary | ICD-10-CM | POA: Insufficient documentation

## 2016-09-16 DIAGNOSIS — R059 Cough, unspecified: Secondary | ICD-10-CM

## 2016-09-16 DIAGNOSIS — Z87891 Personal history of nicotine dependence: Secondary | ICD-10-CM | POA: Insufficient documentation

## 2016-09-16 LAB — URINALYSIS COMPLETE WITH MICROSCOPIC (ARMC ONLY)
Bilirubin Urine: NEGATIVE
Glucose, UA: NEGATIVE mg/dL
HGB URINE DIPSTICK: NEGATIVE
Nitrite: NEGATIVE
PH: 7 (ref 5.0–8.0)
PROTEIN: 30 mg/dL — AB
Specific Gravity, Urine: 1.021 (ref 1.005–1.030)

## 2016-09-16 LAB — COMPREHENSIVE METABOLIC PANEL
ALBUMIN: 3.6 g/dL (ref 3.5–5.0)
ALT: 8 U/L — ABNORMAL LOW (ref 14–54)
AST: 17 U/L (ref 15–41)
Alkaline Phosphatase: 62 U/L (ref 38–126)
Anion gap: 6 (ref 5–15)
BILIRUBIN TOTAL: 2.2 mg/dL — AB (ref 0.3–1.2)
BUN: 7 mg/dL (ref 6–20)
CHLORIDE: 105 mmol/L (ref 101–111)
CO2: 26 mmol/L (ref 22–32)
Calcium: 8.4 mg/dL — ABNORMAL LOW (ref 8.9–10.3)
Creatinine, Ser: 0.88 mg/dL (ref 0.44–1.00)
GFR calc Af Amer: >60 mL/min (ref >60)
GFR calc non Af Amer: >60 mL/min (ref >60)
GLUCOSE: 93 mg/dL (ref 65–99)
POTASSIUM: 3 mmol/L — AB (ref 3.5–5.1)
SODIUM: 137 mmol/L (ref 135–145)
Total Protein: 6.7 g/dL (ref 6.5–8.1)

## 2016-09-16 LAB — CBC WITH DIFFERENTIAL/PLATELET
BASOS ABS: 0.1 10*3/uL (ref 0–0.1)
BASOS PCT: 0 %
EOS ABS: 0.4 10*3/uL (ref 0–0.7)
Eosinophils Relative: 2 %
HEMATOCRIT: 41.5 % (ref 35.0–47.0)
Hemoglobin: 13.8 g/dL (ref 12.0–16.0)
Lymphocytes Relative: 13 %
Lymphs Abs: 2.4 10*3/uL (ref 1.0–3.6)
MCH: 32.1 pg (ref 26.0–34.0)
MCHC: 33.2 g/dL (ref 32.0–36.0)
MCV: 96.9 fL (ref 80.0–100.0)
MONO ABS: 1.5 10*3/uL — AB (ref 0.2–0.9)
Monocytes Relative: 8 %
NEUTROS ABS: 14.5 10*3/uL — AB (ref 1.4–6.5)
NEUTROS PCT: 77 %
PLATELETS: 268 10*3/uL (ref 150–440)
RBC: 4.28 MIL/uL (ref 3.80–5.20)
RDW: 13.1 % (ref 11.5–14.5)
WBC: 18.9 10*3/uL — ABNORMAL HIGH (ref 3.6–11.0)

## 2016-09-16 LAB — MONONUCLEOSIS SCREEN: Mono Screen: NEGATIVE

## 2016-09-16 LAB — POCT PREGNANCY, URINE: PREG TEST UR: NEGATIVE

## 2016-09-16 LAB — POCT RAPID STREP A: STREPTOCOCCUS, GROUP A SCREEN (DIRECT): NEGATIVE

## 2016-09-16 MED ORDER — AMOXICILLIN 500 MG PO CAPS
500.0000 mg | ORAL_CAPSULE | Freq: Once | ORAL | Status: AC
  Administered 2016-09-16: 500 mg via ORAL
  Filled 2016-09-16: qty 1

## 2016-09-16 MED ORDER — KETOROLAC TROMETHAMINE 30 MG/ML IJ SOLN
30.0000 mg | Freq: Once | INTRAMUSCULAR | Status: AC
  Administered 2016-09-16: 30 mg via INTRAVENOUS
  Filled 2016-09-16: qty 1

## 2016-09-16 MED ORDER — SODIUM CHLORIDE 0.9 % IV BOLUS (SEPSIS)
1000.0000 mL | Freq: Once | INTRAVENOUS | Status: AC
  Administered 2016-09-16: 1000 mL via INTRAVENOUS

## 2016-09-16 MED ORDER — AMOXICILLIN 875 MG PO TABS: 875.0000 mg | ORAL_TABLET | Freq: Two times a day (BID) | ORAL | 0 refills | Status: AC

## 2016-09-16 NOTE — ED Triage Notes (Signed)
Patient presents to the ED with generalized body aches mostly from the waist up, coughing productive green/yellow. Patient reports that a friend of hers has MRSA and she has been around them. Patient reports chest tenderness with palpation in the center of chest. Patient also has a knot on the right side of neck and has a knot and tenderness to the right neck more anterior.

## 2016-09-16 NOTE — ED Provider Notes (Addendum)
[facility] Emergency Department Provider Note  ____________________________________________  Time seen: Approximately 6:16 PM  I have reviewed the triage vital signs and the nursing notes.   HISTORY  Chief Complaint Generalized Body Aches   HPI Christie Ali is a 30 y.o. female with a history of mild intermittent asthma who presents for evaluation of cough and body aches. Patient reports that she has had the symptoms for 2 weeks. She has had a cough productive of green sputum, generalized body aches. She has been using her inhalers at home with improvement of her shortness of breath. She denies any shortness of breath at this time. She denies chest pain, fever but does have chills and night sweats. She denies vomiting, abdominal pain, nausea, diarrhea, dysuria, rash, headache, neck stiffness, sore throat. She has noticed many bumps in her neck. Roommate with similar symptoms.  Past Medical History:  Diagnosis Date  . Asthma     There are no active problems to display for this patient.   History reviewed. No pertinent surgical history.  Prior to Admission medications   Medication Sig Start Date End Date Taking? Authorizing Provider  acetaminophen (TYLENOL) 325 MG tablet Take 650 mg by mouth every 6 (six) hours as needed for mild pain or headache.   Yes Historical Provider, MD  ferrous sulfate 325 (65 FE) MG tablet Take 325 mg by mouth daily with breakfast.   Yes Historical Provider, MD  amoxicillin (AMOXIL) 875 MG tablet Take 1 tablet (875 mg total) by mouth 2 (two) times daily. 09/16/16 09/23/16  Rudene Re, MD  doxycycline (VIBRAMYCIN) 100 MG capsule Take 1 capsule (100 mg total) by mouth 2 (two) times daily. Patient not taking: Reported on 09/16/2016 08/06/15   Domenic Moras, PA-C  HYDROcodone-acetaminophen (NORCO) 5-325 MG tablet Take 1-2 tablets by mouth every 4 (four) hours as needed for moderate pain. Patient not taking: Reported on 09/16/2016  05/25/16   Pierce Crane Beers, PA-C  medroxyPROGESTERone (DEPO-PROVERA) 150 MG/ML injection Inject 150 mg into the muscle every 3 (three) months.    Historical Provider, MD    Allergies Sulfa antibiotics and Other  No family history on file.  Social History Social History  Substance Use Topics  . Smoking status: Former Smoker    Quit date: 09/02/2016  . Smokeless tobacco: Never Used  . Alcohol use No    Review of Systems  Constitutional: Negative for fever. + chills, body aches Eyes: Negative for visual changes. ENT: Negative for sore throat. Cardiovascular: Negative for chest pain. Respiratory: + shortness of breath and productive cough Gastrointestinal: Negative for abdominal pain, vomiting or diarrhea. Genitourinary: Negative for dysuria. Musculoskeletal: Negative for back pain. Skin: Negative for rash. Neurological: Negative for headaches, weakness or numbness.  ____________________________________________   PHYSICAL EXAM:  VITAL SIGNS: ED Triage Vitals  Enc Vitals Group     BP 09/16/16 1748 123/81     Pulse Rate 09/16/16 1522 70     Resp 09/16/16 1522 18     Temp 09/16/16 1522 98.6 F (37 C)     Temp Source 09/16/16 1522 Oral     SpO2 09/16/16 1522 97 %     Weight 09/16/16 1523 145 lb (65.8 kg)     Height 09/16/16 1523 5\' 9"  (1.753 m)     Head Circumference --      Peak Flow --      Pain Score 09/16/16 1530 10     Pain Loc --      Pain  Edu? --      Excl. in [place]? --     Constitutional: Alert and oriented. Well appearing and in no apparent distress. HEENT:      Head: Normocephalic and atraumatic.         Eyes: Conjunctivae are normal. Sclera is non-icteric. EOMI. PERRL      Mouth/Throat: Mucous membranes are moist.       Neck: Supple with no signs of meningismus. Shotty lymphadenopathy bilaterally R>L Cardiovascular: Regular rate and rhythm. No murmurs, gallops, or rubs. 2+ symmetrical distal pulses are present in all extremities. No JVD. Respiratory: Normal  respiratory effort. Lungs are clear to auscultation bilaterally. No wheezes, crackles, or rhonchi.  Gastrointestinal: Soft, non tender, and non distended with positive bowel sounds. No rebound or guarding. Genitourinary: No CVA tenderness. Musculoskeletal: Nontender with normal range of motion in all extremities. No edema, cyanosis, or erythema of extremities. Neurologic: Normal speech and language. Face is symmetric. Moving all extremities. No gross focal neurologic deficits are appreciated. Skin: Skin is warm, dry and intact. No rash noted. Psychiatric: Mood and affect are normal. Speech and behavior are normal.  ____________________________________________   LABS (all labs ordered are listed, but only abnormal results are displayed)  Labs Reviewed  CBC WITH DIFFERENTIAL/PLATELET - Abnormal; Notable for the following:       Result Value   WBC 18.9 (*)    Neutro Abs 14.5 (*)    Monocytes Absolute 1.5 (*)    All other components within normal limits  COMPREHENSIVE METABOLIC PANEL - Abnormal; Notable for the following:    Potassium 3.0 (*)    Calcium 8.4 (*)    ALT 8 (*)    Total Bilirubin 2.2 (*)    All other components within normal limits  MONONUCLEOSIS SCREEN  URINALYSIS COMPLETEWITH MICROSCOPIC ([facility] ONLY)  POCT RAPID STREP A   ____________________________________________  EKG  ED ECG REPORT I, Rudene Re, the attending physician, personally viewed and interpreted this ECG.  Normal sinus rhythm, rate of 78, normal intervals, normal axis, no ST elevations or depressions. ____________________________________________  RADIOLOGY  CXR: negative  ____________________________________________   PROCEDURES  Procedure(s) performed: None Procedures Critical Care performed:  None ____________________________________________   INITIAL IMPRESSION / ASSESSMENT AND PLAN / ED COURSE  30 y.o. female with a history of mild intermittent asthma who presents for  evaluation of cough, chills, and body aches x 2 weeks. Patient is well-appearing nondistressed, she has normal vital signs, her lungs are clear to auscultation, she does have shotty cervical lymphadenopathy bilaterally worse on the right side, oropharynx is clear, abdomen is soft and nontender, no rash. CXR with no PNA. We'll treat symptoms with IV fluids and IV Toradol. We'll check CBC, CMP, mono and strep.  Clinical Course  Comment By Time  White count of 18.9 mostly neutrophils, remaining of her workup is negative including negative strep, mono, chest x-ray. Unclear etiology however with elevated white count and lymphadenopathy we'll start patient on a course of antibiotics and have her follow-up with primary care doctor on Monday. Rudene Re, MD 09/28 2009   UA with LE and few bacteria, no nitrites and patient with no urinary symptoms, therefore will not treat until culture is done.   Pertinent labs & imaging results that were available during my care of the patient were reviewed by me and considered in my medical decision making (see chart for details).    ____________________________________________   FINAL CLINICAL IMPRESSION(S) / ED DIAGNOSES  Final diagnoses:  Cervical lymphadenopathy  Leukocytosis  Body aches  Cough      NEW MEDICATIONS STARTED DURING THIS VISIT:  New Prescriptions   AMOXICILLIN (AMOXIL) 875 MG TABLET    Take 1 tablet (875 mg total) by mouth 2 (two) times daily.     Note:  This document was prepared using Dragon voice recognition software and may include unintentional dictation errors.    Rudene Re, MD 09/16/16 2013    Rudene Re, MD 09/16/16 2111

## 2016-09-16 NOTE — Discharge Instructions (Signed)
Take antibiotics as prescribed. Follow-up with your primary care doctor Monday. Return to the emergency room if you have a fever, chest pain, or any other symptoms concerning to you.

## 2016-09-18 LAB — URINE CULTURE

## 2017-10-12 ENCOUNTER — Emergency Department (HOSPITAL_COMMUNITY): Payer: BLUE CROSS/BLUE SHIELD

## 2017-10-12 ENCOUNTER — Emergency Department (HOSPITAL_COMMUNITY)
Admission: EM | Admit: 2017-10-12 | Discharge: 2017-10-12 | Disposition: A | Discharge status: Home / Self Care | Payer: BLUE CROSS/BLUE SHIELD | Attending: Emergency Medicine | Admitting: Emergency Medicine

## 2017-10-12 ENCOUNTER — Encounter (HOSPITAL_COMMUNITY): Payer: Self-pay | Admitting: *Deleted

## 2017-10-12 DIAGNOSIS — Z3201 Encounter for pregnancy test, result positive: Secondary | ICD-10-CM

## 2017-10-12 DIAGNOSIS — Z79899 Other long term (current) drug therapy: Secondary | ICD-10-CM | POA: Insufficient documentation

## 2017-10-12 DIAGNOSIS — J45909 Unspecified asthma, uncomplicated: Secondary | ICD-10-CM | POA: Insufficient documentation

## 2017-10-12 DIAGNOSIS — R102 Pelvic and perineal pain: Secondary | ICD-10-CM | POA: Insufficient documentation

## 2017-10-12 DIAGNOSIS — Z87891 Personal history of nicotine dependence: Secondary | ICD-10-CM | POA: Insufficient documentation

## 2017-10-12 LAB — CBC
HCT: 42.4 % (ref 36.0–46.0)
HEMOGLOBIN: 14.3 g/dL (ref 12.0–15.0)
MCH: 32 pg (ref 26.0–34.0)
MCHC: 33.7 g/dL (ref 30.0–36.0)
MCV: 94.9 fL (ref 78.0–100.0)
PLATELETS: 247 10*3/uL (ref 150–400)
RBC: 4.47 MIL/uL (ref 3.87–5.11)
RDW: 12.2 % (ref 11.5–15.5)
WBC: 14.5 10*3/uL — AB (ref 4.0–10.5)

## 2017-10-12 LAB — URINALYSIS, ROUTINE W REFLEX MICROSCOPIC
BILIRUBIN URINE: NEGATIVE
GLUCOSE, UA: NEGATIVE mg/dL
HGB URINE DIPSTICK: NEGATIVE
KETONES UR: NEGATIVE mg/dL
LEUKOCYTES UA: NEGATIVE
NITRITE: NEGATIVE
PH: 6 (ref 5.0–8.0)
Protein, ur: NEGATIVE mg/dL
Specific Gravity, Urine: 1.024 (ref 1.005–1.030)

## 2017-10-12 LAB — COMPREHENSIVE METABOLIC PANEL
ALK PHOS: 46 U/L (ref 38–126)
ALT: 13 U/L — AB (ref 14–54)
ANION GAP: 9 (ref 5–15)
AST: 24 U/L (ref 15–41)
Albumin: 3.9 g/dL (ref 3.5–5.0)
BUN: 13 mg/dL (ref 6–20)
CALCIUM: 9.3 mg/dL (ref 8.9–10.3)
CO2: 23 mmol/L (ref 22–32)
CREATININE: 1.12 mg/dL — AB (ref 0.44–1.00)
Chloride: 106 mmol/L (ref 101–111)
GFR calc Af Amer: >60 mL/min (ref >60)
Glucose, Bld: 74 mg/dL (ref 65–99)
Potassium: 4 mmol/L (ref 3.5–5.1)
Sodium: 138 mmol/L (ref 135–145)
Total Bilirubin: 0.9 mg/dL (ref 0.3–1.2)
Total Protein: 6.3 g/dL — ABNORMAL LOW (ref 6.5–8.1)

## 2017-10-12 LAB — I-STAT BETA HCG BLOOD, ED (MC, WL, AP ONLY): I-stat hCG, quantitative: 207.3 m[IU]/mL — ABNORMAL HIGH (ref <5)

## 2017-10-12 LAB — ABO/RH: ABO/RH(D): B POS

## 2017-10-12 LAB — HCG, QUANTITATIVE, PREGNANCY: hCG, Beta Chain, Quant, S: 228 m[IU]/mL — ABNORMAL HIGH (ref <5)

## 2017-10-12 LAB — LIPASE, BLOOD: LIPASE: 51 U/L (ref 11–51)

## 2017-10-12 MED ORDER — SODIUM CHLORIDE 0.9 % IV BOLUS (SEPSIS)
1000.0000 mL | Freq: Once | INTRAVENOUS | Status: AC
  Administered 2017-10-12: 1000 mL via INTRAVENOUS

## 2017-10-12 MED ORDER — ONDANSETRON HCL 4 MG/2ML IJ SOLN
4.0000 mg | Freq: Once | INTRAMUSCULAR | Status: AC
  Administered 2017-10-12: 4 mg via INTRAVENOUS
  Filled 2017-10-12: qty 2

## 2017-10-12 MED ORDER — MORPHINE SULFATE (PF) 4 MG/ML IV SOLN
4.0000 mg | Freq: Once | INTRAVENOUS | Status: AC
  Administered 2017-10-12: 4 mg via INTRAVENOUS
  Filled 2017-10-12: qty 1

## 2017-10-12 NOTE — ED Notes (Signed)
[facility] sent down for Rh, per blood bank, pt does not need blue arm band

## 2017-10-12 NOTE — Progress Notes (Signed)
Phone consultation with provider, discussing dx of pregnancy of unknown location with some right adnexal pain. I agreed with  Plan for followup QHCG in 48-60 hr, scheduled [facility] 8 am [facility].

## 2017-10-12 NOTE — ED Triage Notes (Signed)
To ED for eval of RLQ pain for past couple of days. States it has been intermittent over the past couple of days. Vomiting this am. Standing makes pain worse. No vaginal discharge. LMP normal a month ago

## 2017-10-12 NOTE — ED Provider Notes (Signed)
[place] EMERGENCY DEPARTMENT Provider Note   CSN: [id number] Arrival date & time: 10/12/17  1457     History   Chief Complaint Chief Complaint  Patient presents with  . Abdominal Pain    HPI Christie Ali is a 31 y.o. female.  HPI   Christie Ali is a 31 y.o. female, with a history of Asthma, presenting to the ED with right lower abdominal and flank pain beginning Oct 21. Pain is constant, 8/10, cramping, nonradiating. Nausea with one episode of emesis this morning.  Denies fever/chills, diarrhea/constipation, abnormal vaginal discharge, vaginal bleeding, SOB, CP, diaphoresis, or any other complaints.  Last food around 1730 this evening. LMP 09/06/17. Sexually active with one female partner. Denies previous pregnancies.     Past Medical History:  Diagnosis Date  . Asthma     There are no active problems to display for this patient.   History reviewed. No pertinent surgical history.  OB History    No data available       Home Medications    Prior to Admission medications   Medication Sig Start Date End Date Taking? Authorizing Provider  acetaminophen (TYLENOL) 325 MG tablet Take 650 mg by mouth every 6 (six) hours as needed for mild pain or headache.    [provider]  doxycycline (VIBRAMYCIN) 100 MG capsule Take 1 capsule (100 mg total) by mouth 2 (two) times daily. Patient not taking: Reported on 09/16/2016 08/06/15   Domenic Moras, PA-C  ferrous sulfate 325 (65 FE) MG tablet Take 325 mg by mouth daily with breakfast.    [provider]  HYDROcodone-acetaminophen (NORCO) 5-325 MG tablet Take 1-2 tablets by mouth every 4 (four) hours as needed for moderate pain. Patient not taking: Reported on 09/16/2016 05/25/16   Arlyss Repress, PA-C  medroxyPROGESTERone (DEPO-PROVERA) 150 MG/ML injection Inject 150 mg into the muscle every 3 (three) months.    [provider]    Family History No family history on  file.  Social History Social History  Substance Use Topics  . Smoking status: Former Smoker    Quit date: 09/02/2016  . Smokeless tobacco: Never Used  . Alcohol use No     Allergies   Sulfa antibiotics and Other   Review of Systems Review of Systems  Constitutional: Negative for chills, diaphoresis and fever.  Respiratory: Negative for shortness of breath.   Cardiovascular: Negative for chest pain.  Gastrointestinal: Positive for abdominal pain, nausea and vomiting. Negative for blood in stool, constipation and diarrhea.  Genitourinary: Negative for vaginal bleeding and vaginal discharge.  Musculoskeletal: Negative for back pain.  Neurological: Negative for dizziness, syncope and light-headedness.  All other systems reviewed and are negative.    Physical Exam Updated Vital Signs BP 105/67 (BP Location: Left Arm)   Pulse 75   Temp 97.8 F (36.6 C) (Oral)   Resp 18   Ht 5\' 8"  (1.727 m)   Wt 64.9 kg (143 lb)   SpO2 100%   BMI 21.74 kg/m   Physical Exam  Constitutional: She appears well-developed and well-nourished. No distress.  HENT:  Head: Normocephalic and atraumatic.  Eyes: Conjunctivae are normal.  Neck: Neck supple.  Cardiovascular: Normal rate, regular rhythm, normal heart sounds and intact distal pulses.   Pulmonary/Chest: Effort normal and breath sounds normal. No respiratory distress.  Abdominal: Soft. There is tenderness. There is no guarding.    No peritoneal signs.  Genitourinary:  Genitourinary Comments: External genitalia normal Vagina without discharge  Cervix  Normal - cervical os closed negative for cervical motion tenderness Adnexa palpated, no masses, positive for tenderness noted on the right Bladder palpated negative for tenderness Uterus palpated no masses, negative for tenderness  No inguinal lymphadenopathy. Otherwise normal female genitalia. RN, Roselyn Reef, served as chaperone during exam.  Musculoskeletal: She exhibits no edema.   Lymphadenopathy:    She has no cervical adenopathy.  Neurological: She is alert.  Skin: Skin is warm and dry. Capillary refill takes less than 2 seconds. She is not diaphoretic.  Psychiatric: She has a normal mood and affect. Her behavior is normal.  Nursing note and vitals reviewed.    ED Treatments / Results  Labs (all labs ordered are listed, but only abnormal results are displayed) Labs Reviewed  COMPREHENSIVE METABOLIC PANEL - Abnormal; Notable for the following:       Result Value   Creatinine, Ser 1.12 (*)    Total Protein 6.3 (*)    ALT 13 (*)    All other components within normal limits  CBC - Abnormal; Notable for the following:    WBC 14.5 (*)    All other components within normal limits  HCG, QUANTITATIVE, PREGNANCY - Abnormal; Notable for the following:    hCG, Beta Chain, Quant, S 228 (*)    All other components within normal limits  I-STAT BETA HCG BLOOD, ED ([facility], [facility], [facility] ONLY) - Abnormal; Notable for the following:    I-stat hCG, quantitative 207.3 (*)    All other components within normal limits  LIPASE, BLOOD  URINALYSIS, ROUTINE W REFLEX MICROSCOPIC  ABO/RH    EKG  EKG Interpretation None       Radiology US Ob Comp < 14 Wks  Result Date: 10/12/2017 CLINICAL DATA:  Right lower quadrant pain EXAM: OBSTETRIC <14 WK [place] AND TRANSVAGINAL OB US TECHNIQUE: Both transabdominal and transvaginal ultrasound examinations were performed for complete evaluation of the gestation as well as the maternal uterus, adnexal regions, and pelvic cul-de-sac. Transvaginal technique was performed to assess early pregnancy. COMPARISON:  None. FINDINGS: Intrauterine gestational sac: None visualized Yolk sac:  Not visualized Embryo:  Not visualized Cardiac Activity: Heart Rate:   bpm MSD:   mm    w     d CRL:    mm    w    d                  [place] EDC: Subchorionic hemorrhage:  N/A Maternal uterus/adnexae: No adnexal masses or free fluid. Endometrium 16 mm in thickness. IMPRESSION: No  intrauterine pregnancy visualized. Differential considerations would include early intrauterine pregnancy too early to visualize, spontaneous abortion, or occult ectopic pregnancy. Recommend close clinical followup and serial quantitative beta HCGs and ultrasounds. Electronically Signed   By: Rolm Baptise M.D.   On: 10/12/2017 20:12   US Ob Transvaginal  Result Date: 10/12/2017 CLINICAL DATA:  Right lower quadrant pain EXAM: OBSTETRIC <14 WK [place] AND TRANSVAGINAL OB US TECHNIQUE: Both transabdominal and transvaginal ultrasound examinations were performed for complete evaluation of the gestation as well as the maternal uterus, adnexal regions, and pelvic cul-de-sac. Transvaginal technique was performed to assess early pregnancy. COMPARISON:  None. FINDINGS: Intrauterine gestational sac: None visualized Yolk sac:  Not visualized Embryo:  Not visualized Cardiac Activity: Heart Rate:   bpm MSD:   mm    w     d CRL:    mm    w    d  [place] EDC: Subchorionic hemorrhage:  N/A Maternal uterus/adnexae: No adnexal masses or free fluid. Endometrium 16 mm in thickness. IMPRESSION: No intrauterine pregnancy visualized. Differential considerations would include early intrauterine pregnancy too early to visualize, spontaneous abortion, or occult ectopic pregnancy. Recommend close clinical followup and serial quantitative beta HCGs and ultrasounds. Electronically Signed   By: Rolm Baptise M.D.   On: 10/12/2017 20:12    Procedures Pelvic exam Date/Time: 10/12/2017 6:41 PM Performed by: Christie Ali Authorized by: Arlean Hopping C  Consent: Verbal consent obtained. Risks and benefits: risks, benefits and alternatives were discussed Consent given by: patient Patient identity confirmed: verbally with patient and provided demographic data Local anesthesia used: no  Anesthesia: Local anesthesia used: no  Sedation: Patient sedated: no Patient tolerance: Patient tolerated the procedure well with no immediate  complications    (including critical care time)  Medications Ordered in ED Medications  sodium chloride 0.9 % bolus 1,000 mL (1,000 mLs Intravenous New Bag/Given 10/12/17 1905)  morphine 4 MG/ML injection 4 mg (4 mg Intravenous Given 10/12/17 1922)  ondansetron (ZOFRAN) injection 4 mg (4 mg Intravenous Given 10/12/17 1920)     Initial Impression / Assessment and Plan / ED Course  I have reviewed the triage vital signs and the nursing notes.  Pertinent labs & imaging results that were available during my care of the patient were reviewed by me and considered in my medical decision making (see chart for details).  Clinical Course as of Oct 12 2105  Wed Oct 12, 2017  2047 Spoke with Dr. Glo Herring, OBGYN. Patient should follow up at [facility]U at [facility] on Saturday, Oct 27 for reevaluation and assessment.  [SJ]  2055 Discussed imaging results and plan of care with patient.  Patient voiced understanding and agrees to the plan.  The patient is pain-free. Instructions for follow-up as well as return precautions were communicated with the patient as well as the patient's family at the bedside, with the patient's verbal permission.  [SJ]    Clinical Course User Index [SJ] Jimel Myler C, PA-C    Patient presents with right pelvic pain.  Positive hCG.  No intrauterine or extrauterine pregnancy noted on ultrasound.  Patient hemodynamically stable.  Patient has an appointment for reevaluation at the [facility]U on the morning of October 27.   Findings and plan of care discussed with Brantley Stage, MD.   Vitals:   10/12/17 1543 10/12/17 1800  BP: 105/67 109/66  Pulse: 75 (!) 59  Resp: 18 16  Temp: 97.8 F (36.6 C)   TempSrc: Oral   SpO2: 100% 100%  Weight: 64.9 kg (143 lb)   Height: 5\' 8"  (1.727 m)       Final Clinical Impressions(s) / ED Diagnoses   Final diagnoses:  Pelvic pain in female  Positive pregnancy test    New Prescriptions New Prescriptions   No  medications on file     Layla Maw 10/12/17 2106    Forde Dandy, MD 10/13/17 0005

## 2017-10-12 NOTE — Discharge Instructions (Signed)
Your pregnancy test today was positive.  The quantitative hCG level was 228. There was no intrauterine pregnancy seen on the ultrasound, therefore, you will need further evaluation to rule out an ectopic pregnancy.  Information on ectopic pregnancy has been provided.  This does not mean that you have an ectopic pregnancy, at this point is for informational purposes only.  You are to follow up at the [facility] ([facility]) at [facility] on Saturday, October 27.  However, should symptoms worsen including worsening pain, dizziness, persistent vomiting, or any other major concerns, please proceed to the [facility] emergency department.   Antiinflammatory medications: Take 600 mg of ibuprofen every 6 hours or 440 mg (over the counter dose) to 500 mg (prescription dose) of naproxen every 12 hours for the next 3 days. After this time, these medications may be used as needed for pain. Take these medications with food to avoid upset stomach. Choose only one of these medications, do not take them together. Tylenol: Should you continue to have additional pain while taking the ibuprofen or naproxen, you may add in tylenol as needed. Your daily total maximum amount of tylenol from all sources should be limited to 4000mg /day for persons without liver problems, or 2000mg /day for those with liver problems.

## 2017-10-12 NOTE — ED Notes (Signed)
Patient transported to Ultrasound 

## 2017-10-15 ENCOUNTER — Inpatient Hospital Stay (HOSPITAL_COMMUNITY)
Admission: AD | Admit: 2017-10-15 | Discharge: 2017-10-15 | Disposition: A | Discharge status: Home / Self Care | Payer: BLUE CROSS/BLUE SHIELD | Source: Ambulatory Visit | Attending: Obstetrics and Gynecology | Admitting: Obstetrics and Gynecology

## 2017-10-15 ENCOUNTER — Encounter: Payer: Self-pay | Admitting: Student

## 2017-10-15 DIAGNOSIS — O99511 Diseases of the respiratory system complicating pregnancy, first trimester: Secondary | ICD-10-CM | POA: Insufficient documentation

## 2017-10-15 DIAGNOSIS — O26891 Other specified pregnancy related conditions, first trimester: Secondary | ICD-10-CM

## 2017-10-15 DIAGNOSIS — Z87891 Personal history of nicotine dependence: Secondary | ICD-10-CM | POA: Insufficient documentation

## 2017-10-15 DIAGNOSIS — O3680X0 Pregnancy with inconclusive fetal viability, not applicable or unspecified: Secondary | ICD-10-CM

## 2017-10-15 DIAGNOSIS — Z79899 Other long term (current) drug therapy: Secondary | ICD-10-CM | POA: Insufficient documentation

## 2017-10-15 DIAGNOSIS — O26899 Other specified pregnancy related conditions, unspecified trimester: Secondary | ICD-10-CM

## 2017-10-15 DIAGNOSIS — Z3A01 Less than 8 weeks gestation of pregnancy: Secondary | ICD-10-CM | POA: Insufficient documentation

## 2017-10-15 DIAGNOSIS — R109 Unspecified abdominal pain: Secondary | ICD-10-CM | POA: Insufficient documentation

## 2017-10-15 DIAGNOSIS — J45909 Unspecified asthma, uncomplicated: Secondary | ICD-10-CM | POA: Insufficient documentation

## 2017-10-15 LAB — HCG, QUANTITATIVE, PREGNANCY: HCG, BETA CHAIN, QUANT, S: 832 m[IU]/mL — AB (ref <5)

## 2017-10-15 NOTE — MAU Provider Note (Signed)
History   [id number]   Chief Complaint  Patient presents with  . Follow-up    HPI Christie Ali is a 31 y.o. female G1P0 here for follow-up BHCG.  Upon review of the records patient was first seen at [facility] on 10/24 for abdominal pain. BHCG on that day was 228.  Ultrasound showed no IUP or evidence of ectopic. Presents today for f/u bhcg. Denies abdominal pain or vaginal bleeding.   Patient's last menstrual period was 09/06/2017.  OB History  Gravida Para Term Preterm AB Living  1            SAB TAB Ectopic Multiple Live Births               # Outcome Date GA Lbr Len/2nd Weight Sex Delivery Anes PTL Lv  1 Current               Past Medical History:  Diagnosis Date  . Asthma     No family history on file.  Social History   Social History  . Marital status: Single    Spouse name: N/A  . Number of children: N/A  . Years of education: N/A   Social History Main Topics  . Smoking status: Former Smoker    Quit date: 09/02/2016  . Smokeless tobacco: Never Used  . Alcohol use No  . Drug use: No  . Sexual activity: Not on file   Other Topics Concern  . Not on file   Social History Narrative  . No narrative on file    Allergies  Allergen Reactions  . Sulfa Antibiotics Hives  . Other Rash    Nickel     No current facility-administered medications on file prior to encounter.    Current Outpatient Prescriptions on File Prior to Encounter  Medication Sig Dispense Refill  . acetaminophen (TYLENOL) 325 MG tablet Take 650 mg by mouth every 6 (six) hours as needed for mild pain or headache.    . doxycycline (VIBRAMYCIN) 100 MG capsule Take 1 capsule (100 mg total) by mouth 2 (two) times daily. (Patient not taking: Reported on 09/16/2016) 20 capsule 0  . ferrous sulfate 325 (65 FE) MG tablet Take 325 mg by mouth daily with breakfast.    . HYDROcodone-acetaminophen (NORCO) 5-325 MG tablet Take 1-2 tablets by mouth every 4 (four) hours as needed for moderate pain.  (Patient not taking: Reported on 09/16/2016) 15 tablet 0  . medroxyPROGESTERone (DEPO-PROVERA) 150 MG/ML injection Inject 150 mg into the muscle every 3 (three) months.       Physical Exam   Vitals:   10/15/17 0812  BP: (!) 121/53  Pulse: 78  Resp: 15  Temp: 98.2 F (36.8 C)    Physical Exam  Nursing note and vitals reviewed. Constitutional: She is oriented to person, place, and time. She appears well-developed and well-nourished. No distress.  HENT:  Head: Normocephalic and atraumatic.  Eyes: Conjunctivae are normal. Right eye exhibits no discharge. Left eye exhibits no discharge. No scleral icterus.  Neck: Normal range of motion.  Respiratory: Effort normal. No respiratory distress.  Neurological: She is alert and oriented to person, place, and time.  Skin: She is not diaphoretic.  Psychiatric: She has a normal mood and affect. Her behavior is normal. Judgment and thought content normal.    MAU Course  Procedures Results for orders placed or performed during the hospital encounter of 10/15/17 (from the past 24 hour(s))  hCG, quantitative, pregnancy     Status: Abnormal  Collection Time: 10/15/17  8:10 AM  Result Value Ref Range   hCG, Beta Chain, Quant, S 832 (H) <5 mIU/mL    MDM Appropriate rise in BHCG Component     Latest Ref Rng & Units 10/12/2017 10/15/2017  HCG, Beta Chain, Quant, S     <5 mIU/mL 228 (H) 832 (H)   Will have patient f/u for additional BHCG & if continues appropriate rise will order outpatient ultrasound for viability  Assessment and Plan  31 y.o. G1P0 at [redacted]w[redacted]d wks Pregnancy Follow-up BHCG Pregnancy of Unknown Location  P: Discharge home Pt to return to [place] Monday morning for repeat BHCG Discussed reasons to return to MAU  Jorje Guild, NP 10/15/2017 9:07 AM

## 2017-10-15 NOTE — Discharge Instructions (Signed)
Return to care   If you have heavier bleeding that soaks through more that 2 pads per hour for an hour or more  If you bleed so much that you feel like you might pass out or you do pass out  If you have significant abdominal pain that is not improved with Tylenol   If you develop a fever > 100.5     First Trimester of Pregnancy The first trimester of pregnancy is from week 1 until the end of week 13 (months 1 through 3). During this time, your baby will begin to develop inside you. At 6-8 weeks, the eyes and face are formed, and the heartbeat can be seen on ultrasound. At the end of 12 weeks, all the baby's organs are formed. Prenatal care is all the medical care you receive before the birth of your baby. Make sure you get good prenatal care and follow all of your doctor's instructions. Follow these instructions at home: Medicines  Take over-the-counter and prescription medicines only as told by your doctor. Some medicines are safe and some medicines are not safe during pregnancy.  Take a prenatal vitamin that contains at least 600 micrograms (mcg) of folic acid.  If you have trouble pooping (constipation), take medicine that will make your stool soft (stool softener) if your doctor approves. Eating and drinking  Eat regular, healthy meals.  Your doctor will tell you the amount of weight gain that is right for you.  Avoid raw meat and uncooked cheese.  If you feel sick to your stomach (nauseous) or throw up (vomit): ? Eat 4 or 5 small meals a day instead of 3 large meals. ? Try eating a few soda crackers. ? Drink liquids between meals instead of during meals.  To prevent constipation: ? Eat foods that are high in fiber, like fresh fruits and vegetables, whole grains, and beans. ? Drink enough fluids to keep your pee (urine) clear or pale yellow. Activity  Exercise only as told by your doctor. Stop exercising if you have cramps or pain in your lower belly (abdomen) or low  back.  Do not exercise if it is too hot, too humid, or if you are in a place of great height (high altitude).  Try to avoid standing for long periods of time. Move your legs often if you must stand in one place for a long time.  Avoid heavy lifting.  Wear low-heeled shoes. Sit and stand up straight.  You can have sex unless your doctor tells you not to. Relieving pain and discomfort  Wear a good support bra if your breasts are sore.  Take warm water baths (sitz baths) to soothe pain or discomfort caused by hemorrhoids. Use hemorrhoid cream if your doctor says it is okay.  Rest with your legs raised if you have leg cramps or low back pain.  If you have puffy, bulging veins (varicose veins) in your legs: ? Wear support hose or compression stockings as told by your doctor. ? Raise (elevate) your feet for 15 minutes, 3-4 times a day. ? Limit salt in your food. Prenatal care  Schedule your prenatal visits by the twelfth week of pregnancy.  Write down your questions. Take them to your prenatal visits.  Keep all your prenatal visits as told by your doctor. This is important. Safety  Wear your seat belt at all times when driving.  Make a list of emergency phone numbers. The list should include numbers for family, friends, the hospital, and police  and Public relations account executive. General instructions  Ask your doctor for a referral to a local prenatal class. Begin classes no later than at the start of month 6 of your pregnancy.  Ask for help if you need counseling or if you need help with nutrition. Your doctor can give you advice or tell you where to go for help.  Do not use hot tubs, steam rooms, or saunas.  Do not douche or use tampons or scented sanitary pads.  Do not cross your legs for long periods of time.  Avoid all herbs and alcohol. Avoid drugs that are not approved by your doctor.  Do not use any tobacco products, including cigarettes, chewing tobacco, and electronic  cigarettes. If you need help quitting, ask your doctor. You may get counseling or other support to help you quit.  Avoid cat litter boxes and soil used by cats. These carry germs that can cause birth defects in the baby and can cause a loss of your baby (miscarriage) or stillbirth.  Visit your dentist. At home, brush your teeth with a soft toothbrush. Be gentle when you floss. Contact a doctor if:  You are dizzy.  You have mild cramps or pressure in your lower belly.  You have a nagging pain in your belly area.  You continue to feel sick to your stomach, you throw up, or you have watery poop (diarrhea).  You have a bad smelling fluid coming from your vagina.  You have pain when you pee (urinate).  You have increased puffiness (swelling) in your face, hands, legs, or ankles. Get help right away if:  You have a fever.  You are leaking fluid from your vagina.  You have spotting or bleeding from your vagina.  You have very bad belly cramping or pain.  You gain or lose weight rapidly.  You throw up blood. It may look like coffee grounds.  You are around people who have [place] measles, fifth disease, or chickenpox.  You have a very bad headache.  You have shortness of breath.  You have any kind of trauma, such as from a fall or a car accident. Summary  The first trimester of pregnancy is from week 1 until the end of week 13 (months 1 through 3).  To take care of yourself and your unborn baby, you will need to eat healthy meals, take medicines only if your doctor tells you to do so, and do activities that are safe for you and your baby.  Keep all follow-up visits as told by your doctor. This is important as your doctor will have to ensure that your baby is healthy and growing well. This information is not intended to replace advice given to you by your health care provider. Make sure you discuss any questions you have with your health care provider. Document Released:  05/24/2008 Document Revised: 12/14/2016 Document Reviewed: 12/14/2016 Elsevier Interactive Patient Education  2017 [place] Medications in Pregnancy   Acne: Benzoyl Peroxide Salicylic Acid  Backache/Headache: Tylenol: 2 regular strength every 4 hours OR              2 Extra strength every 6 hours  Colds/Coughs/Allergies: Benadryl (alcohol free) 25 mg every 6 hours as needed Breath right strips Claritin Cepacol throat lozenges Chloraseptic throat spray Cold-Eeze- up to three times per day Cough drops, alcohol free Flonase (by prescription only) Guaifenesin Mucinex Robitussin DM (plain only, alcohol free) Saline nasal spray/drops Sudafed (pseudoephedrine) & Actifed ** use only after  [redacted] weeks gestation and if you do not have high blood pressure Tylenol Vicks Vaporub Zinc lozenges Zyrtec   Constipation: Colace Ducolax suppositories Fleet enema Glycerin suppositories Metamucil Milk of magnesia Miralax Senokot Smooth move tea  Diarrhea: Kaopectate Imodium A-D  *NO pepto Bismol  Hemorrhoids: Anusol Anusol HC Preparation H Tucks  Indigestion: Tums Maalox Mylanta Zantac  Pepcid  Insomnia: Benadryl (alcohol free) 25mg  every 6 hours as needed Tylenol PM Unisom, no Gelcaps  Leg Cramps: Tums MagGel  Nausea/Vomiting:  Bonine Dramamine Emetrol Ginger extract Sea bands Meclizine  Nausea medication to take during pregnancy:  Unisom (doxylamine succinate 25 mg tablets) Take one tablet daily at bedtime. If symptoms are not adequately controlled, the dose can be increased to a maximum recommended dose of two tablets daily (1/2 tablet in the morning, 1/2 tablet mid-afternoon and one at bedtime). Vitamin B6 100mg  tablets. Take one tablet twice a day (up to 200 mg per day).  Skin Rashes: Aveeno products Benadryl cream or 25mg  every 6 hours as needed Calamine Lotion 1% cortisone cream  Yeast infection: Gyne-lotrimin 7 Monistat  7  Gum/tooth pain: Anbesol  **If taking multiple medications, please check labels to avoid duplicating the same active ingredients **take medication as directed on the label ** Do not exceed 4000 mg of tylenol in 24 hours **Do not take medications that contain aspirin or ibuprofen

## 2017-10-15 NOTE — MAU Note (Signed)
Patient here for f/u HCG no pain no vaginal bleeding.

## 2017-10-17 ENCOUNTER — Encounter: Payer: Self-pay | Admitting: *Deleted

## 2017-10-17 ENCOUNTER — Ambulatory Visit: Payer: Self-pay | Admitting: *Deleted

## 2017-10-17 DIAGNOSIS — O3680X0 Pregnancy with inconclusive fetal viability, not applicable or unspecified: Secondary | ICD-10-CM

## 2017-10-17 LAB — HCG, QUANTITATIVE, PREGNANCY: hCG, Beta Chain, Quant, S: 2534 m[IU]/mL — ABNORMAL HIGH (ref <5)

## 2017-10-17 NOTE — Progress Notes (Signed)
Patient presented to clinic for stat b-hcg. No pain or bleeding. Results reviewed with Marcille Buffy, CNM, order received to schedule f/u u/s in one week. This was scheduled and patient informed of test results and appointment. Understanding voiced.

## 2017-10-17 NOTE — Progress Notes (Signed)
Christie Ali is a 31 y.o. G1P0 here for FU BHCG. RN reports that pt denies any complaints today. Denies pain or bleeding. HCG is continuing to rise. Will order outpatient FU [place] for one week.   Results for SHELSY, SENG (MRN [id number]) as of 10/17/2017 13:12  Ref. Range 10/12/2017 18:56 10/12/2017 20:08 10/15/2017 08:10 10/17/2017 11:00  HCG, Beta Chain, Quant, S Latest Ref Range: <5 mIU/mL 228 (H)  832 (H) 2,534 (H)   Burnard Enis 1:13 PM 10/17/17

## 2017-10-26 ENCOUNTER — Encounter: Payer: Self-pay | Admitting: General Practice

## 2017-10-26 ENCOUNTER — Ambulatory Visit (HOSPITAL_COMMUNITY)
Admission: RE | Admit: 2017-10-26 | Discharge: 2017-10-26 | Disposition: A | Discharge status: Home / Self Care | Payer: Managed Care, Other (non HMO) | Source: Ambulatory Visit | Attending: Advanced Practice Midwife | Admitting: Advanced Practice Midwife

## 2017-10-26 ENCOUNTER — Ambulatory Visit: Payer: 59 | Admitting: General Practice

## 2017-10-26 DIAGNOSIS — Z3491 Encounter for supervision of normal pregnancy, unspecified, first trimester: Secondary | ICD-10-CM | POA: Insufficient documentation

## 2017-10-26 DIAGNOSIS — Z712 Person consulting for explanation of examination or test findings: Secondary | ICD-10-CM

## 2017-10-26 DIAGNOSIS — O3680X0 Pregnancy with inconclusive fetal viability, not applicable or unspecified: Secondary | ICD-10-CM

## 2017-10-26 DIAGNOSIS — Z3A01 Less than 8 weeks gestation of pregnancy: Secondary | ICD-10-CM | POA: Insufficient documentation

## 2017-10-26 NOTE — Progress Notes (Signed)
I have reviewed this chart and agree with the RN assessment and management.    Feliz Beam, M.D. Attending [place], [facility] for [employer], [place]

## 2017-10-26 NOTE — Progress Notes (Signed)
Patient here for viability ultrasound results today. Reviewed ultrasound results with Dr Rosana Hoes who agrees with favorable IUP. Patient should begin prenatal care. Informed patient of results, dating, & provided pictures. Patient is not taking any meds right now only PNV. Pregnancy verification letter & new OB packet given. Encouraged patient to begin prenatal care.

## 2017-11-07 ENCOUNTER — Telehealth: Payer: Self-pay | Admitting: Obstetrics & Gynecology

## 2017-11-07 ENCOUNTER — Telehealth: Payer: Self-pay

## 2017-11-07 NOTE — Telephone Encounter (Signed)
Patient left message on nurse voicemail on 11/07/2017, requesting information on when she can return to work and restrictions be lifted, please have nurse call pt at [phone]

## 2017-11-07 NOTE — Telephone Encounter (Signed)
Patient called to say she needed a note to go back to work. She has her first appointment with [place] on 12/12. She is requesting a call back, and if she does not answer a message can be left on her voicemail.

## 2017-11-09 ENCOUNTER — Encounter: Payer: Self-pay | Admitting: Advanced Practice Midwife

## 2017-11-09 NOTE — Telephone Encounter (Signed)
Called pt and discussed her concerns. I stated that I could not find in her record where we had taken her out of work. Pt agreed that we did not however she had some restrictions and wanted to know if they were for a limited time or for the duration of the pregnancy. I advised the restrictions given were standard for any pregnancy and are meant to keep her safe as well as afford her the opportunity to manage her physical needs.  Pt voiced understanding and had no further questions. She is scheduled to begin prenatal care on 11/30/17.

## 2017-11-19 ENCOUNTER — Encounter: Payer: Self-pay | Admitting: Emergency Medicine

## 2017-11-19 ENCOUNTER — Other Ambulatory Visit: Payer: Self-pay

## 2017-11-19 ENCOUNTER — Emergency Department
Admission: EM | Admit: 2017-11-19 | Discharge: 2017-11-20 | Disposition: A | Discharge status: Home / Self Care | Payer: Managed Care, Other (non HMO) | Attending: Emergency Medicine | Admitting: Emergency Medicine

## 2017-11-19 DIAGNOSIS — F17200 Nicotine dependence, unspecified, uncomplicated: Secondary | ICD-10-CM | POA: Diagnosis not present

## 2017-11-19 DIAGNOSIS — R102 Pelvic and perineal pain: Secondary | ICD-10-CM | POA: Diagnosis present

## 2017-11-19 DIAGNOSIS — Z79899 Other long term (current) drug therapy: Secondary | ICD-10-CM | POA: Diagnosis not present

## 2017-11-19 DIAGNOSIS — R109 Unspecified abdominal pain: Secondary | ICD-10-CM | POA: Diagnosis not present

## 2017-11-19 DIAGNOSIS — O26891 Other specified pregnancy related conditions, first trimester: Secondary | ICD-10-CM | POA: Diagnosis not present

## 2017-11-19 DIAGNOSIS — D1721 Benign lipomatous neoplasm of skin and subcutaneous tissue of right arm: Secondary | ICD-10-CM | POA: Insufficient documentation

## 2017-11-19 DIAGNOSIS — N760 Acute vaginitis: Secondary | ICD-10-CM | POA: Insufficient documentation

## 2017-11-19 DIAGNOSIS — B9689 Other specified bacterial agents as the cause of diseases classified elsewhere: Secondary | ICD-10-CM | POA: Diagnosis not present

## 2017-11-19 DIAGNOSIS — R2231 Localized swelling, mass and lump, right upper limb: Secondary | ICD-10-CM

## 2017-11-19 DIAGNOSIS — Z3A09 9 weeks gestation of pregnancy: Secondary | ICD-10-CM | POA: Insufficient documentation

## 2017-11-19 LAB — CBC WITH DIFFERENTIAL/PLATELET
Basophils Absolute: 0.1 10*3/uL (ref 0–0.1)
Basophils Relative: 1 %
Eosinophils Absolute: 0.5 10*3/uL (ref 0–0.7)
Eosinophils Relative: 3 %
HEMATOCRIT: 38.8 % (ref 35.0–47.0)
HEMOGLOBIN: 13.2 g/dL (ref 12.0–16.0)
LYMPHS ABS: 4.1 10*3/uL — AB (ref 1.0–3.6)
LYMPHS PCT: 24 %
MCH: 32.2 pg (ref 26.0–34.0)
MCHC: 34.1 g/dL (ref 32.0–36.0)
MCV: 94.3 fL (ref 80.0–100.0)
Monocytes Absolute: 1.4 10*3/uL — ABNORMAL HIGH (ref 0.2–0.9)
Monocytes Relative: 8 %
NEUTROS PCT: 64 %
Neutro Abs: 11.2 10*3/uL — ABNORMAL HIGH (ref 1.4–6.5)
Platelets: 242 10*3/uL (ref 150–440)
RBC: 4.12 MIL/uL (ref 3.80–5.20)
RDW: 12.4 % (ref 11.5–14.5)
WBC: 17.4 10*3/uL — AB (ref 3.6–11.0)

## 2017-11-19 LAB — BASIC METABOLIC PANEL
Anion gap: 7 (ref 5–15)
BUN: 13 mg/dL (ref 6–20)
CHLORIDE: 106 mmol/L (ref 101–111)
CO2: 23 mmol/L (ref 22–32)
Calcium: 8.9 mg/dL (ref 8.9–10.3)
Creatinine, Ser: 0.81 mg/dL (ref 0.44–1.00)
GFR calc Af Amer: >60 mL/min (ref >60)
GFR calc non Af Amer: >60 mL/min (ref >60)
Glucose, Bld: 104 mg/dL — ABNORMAL HIGH (ref 65–99)
POTASSIUM: 3.6 mmol/L (ref 3.5–5.1)
Sodium: 136 mmol/L (ref 135–145)

## 2017-11-19 LAB — URINALYSIS, COMPLETE (UACMP) WITH MICROSCOPIC
BILIRUBIN URINE: NEGATIVE
Glucose, UA: NEGATIVE mg/dL
HGB URINE DIPSTICK: NEGATIVE
KETONES UR: 5 mg/dL — AB
LEUKOCYTES UA: NEGATIVE
NITRITE: NEGATIVE
PH: 6 (ref 5.0–8.0)
Protein, ur: NEGATIVE mg/dL
SPECIFIC GRAVITY, URINE: 1.025 (ref 1.005–1.030)

## 2017-11-19 LAB — HCG, QUANTITATIVE, PREGNANCY: hCG, Beta Chain, Quant, S: 183680 m[IU]/mL — ABNORMAL HIGH (ref <5)

## 2017-11-19 NOTE — ED Notes (Signed)
Pt is approximately nine weeks pregnant at this time.

## 2017-11-19 NOTE — ED Provider Notes (Signed)
[facility] Emergency Department Provider Note   ____________________________________________   First MD Initiated Contact with Patient 11/19/17 2340     (approximate)  I have reviewed the triage vital signs and the nursing notes.   HISTORY  Chief Complaint Pelvic Pain and Abscess    HPI Christie Ali is a 31 y.o. female G1 P0 approximately [redacted] weeks pregnant by dates who presents to the ED from home with a chief complaint of pelvic pain only on urination and on intercourse, and secondarily a knot under her right armpit.  Patient denies vaginal bleeding or discharge.  Last sexual intercourse this morning. States she has had a knot underneath her right armpit for the last several years.  Has grown in size over the past 2 months.  It was seen by her PCP who told her it was fatty tissue.  Patient denies associated fever, chills, chest pain, shortness of breath, nausea, vomiting, diarrhea.  Denies recent travel or trauma.   Past Medical History:  Diagnosis Date  . Asthma     There are no active problems to display for this patient.   History reviewed. No pertinent surgical history.  Prior to Admission medications   Medication Sig Start Date End Date Taking? Authorizing Provider  acetaminophen (TYLENOL) 325 MG tablet Take 650 mg by mouth every 6 (six) hours as needed for mild pain or headache.   Yes [provider]  ferrous sulfate 325 (65 FE) MG tablet Take 325 mg by mouth daily with breakfast.   Yes [provider]  metroNIDAZOLE (METROGEL VAGINAL) 0.75 % vaginal gel Place 1 Applicatorful vaginally at bedtime for 5 days. 11/20/17 11/25/17  Paulette Blanch, MD    Allergies Sulfa antibiotics and Other  No family history on file.  Social History Social History   Tobacco Use  . Smoking status: Current Every Day Smoker    Packs/day: 0.25  . Smokeless tobacco: Never Used  Substance Use Topics  . Alcohol use: No  . Drug use: Yes   Types: Marijuana    Review of Systems  Constitutional: No fever/chills. Eyes: No visual changes. ENT: No sore throat. Cardiovascular: Denies chest pain. Respiratory: Denies shortness of breath. Gastrointestinal: Positive for pelvic pain.  No abdominal pain.  No nausea, no vomiting.  No diarrhea.  No constipation. Genitourinary: Negative for dysuria. Musculoskeletal: Negative for back pain. Skin: Positive for knot under right armpit.  Negative for rash. Neurological: Negative for headaches, focal weakness or numbness.   ____________________________________________   PHYSICAL EXAM:  VITAL SIGNS: ED Triage Vitals  Enc Vitals Group     BP 11/19/17 2139 111/67     Pulse Rate 11/19/17 2139 75     Resp 11/19/17 2139 18     Temp 11/19/17 2139 98.1 F (36.7 C)     Temp Source 11/19/17 2139 Oral     SpO2 11/19/17 2139 98 %     Weight 11/19/17 2136 162 lb (73.5 kg)     Height 11/19/17 2136 5\' 8"  (1.727 m)     Head Circumference --      Peak Flow --      Pain Score 11/19/17 2136 7     Pain Loc --      Pain Edu? --      Excl. in [place]? --     Constitutional: Alert and oriented. Well appearing and in no acute distress. Eyes: Conjunctivae are normal. PERRL. EOMI. Head: Atraumatic. Nose: No congestion/rhinnorhea. Mouth/Throat: Mucous membranes are moist.  Oropharynx non-erythematous. Neck: No stridor.   Cardiovascular: Normal rate, regular rhythm. Grossly normal heart sounds.  Good peripheral circulation. Respiratory: Normal respiratory effort.  No retractions. Lungs CTAB. Gastrointestinal: Soft and nontender to light or deep palpation. No distention. No abdominal bruits. No CVA tenderness. Musculoskeletal: No lower extremity tenderness nor edema.  No joint effusions. Neurologic:  Normal speech and language. No gross focal neurologic deficits are appreciated. No gait instability. Skin:  Skin is warm, dry and intact. No rash noted.  Small area noted to right axilla consistent with  lipoma.  There is no surrounding warmth or erythema.  There is no fluctuance. Psychiatric: Mood and affect are normal. Speech and behavior are normal.  ____________________________________________   LABS (all labs ordered are listed, but only abnormal results are displayed)  Labs Reviewed  WET PREP, GENITAL - Abnormal; Notable for the following components:      Result Value   Clue Cells Wet Prep HPF POC PRESENT (*)    WBC, Wet Prep HPF POC FEW (*)    All other components within normal limits  CBC WITH DIFFERENTIAL/PLATELET - Abnormal; Notable for the following components:   WBC 17.4 (*)    Neutro Abs 11.2 (*)    Lymphs Abs 4.1 (*)    Monocytes Absolute 1.4 (*)    All other components within normal limits  BASIC METABOLIC PANEL - Abnormal; Notable for the following components:   Glucose, Bld 104 (*)    All other components within normal limits  URINALYSIS, COMPLETE (UACMP) WITH MICROSCOPIC - Abnormal; Notable for the following components:   Color, Urine YELLOW (*)    APPearance CLEAR (*)    Ketones, ur 5 (*)    Bacteria, UA RARE (*)    Squamous Epithelial / LPF 0-5 (*)    All other components within normal limits  HCG, QUANTITATIVE, PREGNANCY - Abnormal; Notable for the following components:   hCG, Beta Chain, Quant, S 183,680 (*)    All other components within normal limits  CHLAMYDIA/NGC RT PCR [facility] ONLY)   ____________________________________________  EKG  None ____________________________________________  RADIOLOGY  US Ob Comp Less 14 Wks  Result Date: 11/20/2017 CLINICAL DATA:  Initial evaluation for acute pelvic pain, vaginal bleeding. Early pregnancy. EXAM: OBSTETRIC <14 WK [place] AND TRANSVAGINAL OB US TECHNIQUE: Both transabdominal and transvaginal ultrasound examinations were performed for complete evaluation of the gestation as well as the maternal uterus, adnexal regions, and pelvic cul-de-sac. Transvaginal technique was performed to assess early  pregnancy. COMPARISON:  Prior ultrasound from 10/26/2017. FINDINGS: Intrauterine gestational sac: Single Yolk sac:  Present Embryo:  Present Cardiac Activity: Present Heart Rate: 169  bpm CRL:  29.3  mm   9 w   5 d                  [place] EDC: 06/20/2018 Subchorionic hemorrhage:  None visualized. Maternal uterus/adnexae: Ovaries normal in appearance bilaterally. No adnexal mass. No free fluid. IMPRESSION: 1. Single live intrauterine pregnancy as above, estimated gestational age [redacted] weeks and 5 days by crown-rump length. No complication. 2. No other acute maternal uterine or adnexal abnormality identified. Electronically Signed   By: Jeannine Boga M.D.   On: 11/20/2017 01:53   US Ob Transvaginal  Result Date: 11/20/2017 CLINICAL DATA:  Initial evaluation for acute pelvic pain, vaginal bleeding. Early pregnancy. EXAM: OBSTETRIC <14 WK [place] AND TRANSVAGINAL OB US TECHNIQUE: Both transabdominal and transvaginal ultrasound examinations were performed for complete evaluation of the gestation as well as the maternal  uterus, adnexal regions, and pelvic cul-de-sac. Transvaginal technique was performed to assess early pregnancy. COMPARISON:  Prior ultrasound from 10/26/2017. FINDINGS: Intrauterine gestational sac: Single Yolk sac:  Present Embryo:  Present Cardiac Activity: Present Heart Rate: 169  bpm CRL:  29.3  mm   9 w   5 d                  [place] EDC: 06/20/2018 Subchorionic hemorrhage:  None visualized. Maternal uterus/adnexae: Ovaries normal in appearance bilaterally. No adnexal mass. No free fluid. IMPRESSION: 1. Single live intrauterine pregnancy as above, estimated gestational age [redacted] weeks and 5 days by crown-rump length. No complication. 2. No other acute maternal uterine or adnexal abnormality identified. Electronically Signed   By: Jeannine Boga M.D.   On: 11/20/2017 01:53   [place] Rt Upper Extrem Ltd Soft Tissue Non Vascular  Result Date: 11/20/2017 CLINICAL DATA:  Enlarging right axillary lump EXAM:  ULTRASOUND right UPPER EXTREMITY LIMITED TECHNIQUE: Ultrasound examination of the upper extremity soft tissues was performed in the area of clinical concern. COMPARISON:  None FINDINGS: Targeted ultrasound of the right axilla performed in the region of palpable mass. Slight asymmetric fullness of the soft tissues compare to contralateral left axilla but no well-defined mass or cyst. IMPRESSION: No circumscribed mass or fluid collection in the region of right axillary lump. Further management will need to be based on clinical grounds. Electronically Signed   By: Donavan Foil M.D.   On: 11/20/2017 01:30    ____________________________________________   PROCEDURES  Procedure(s) performed:   Pelvic exam: External exam within normal limits without rashes, lesions or vesicles.  Speculum exam reveals mild white discharge.  No vaginal bleeding.  Cervical loss is closed.  Bimanual exam within normal limits.  Procedures  Critical Care performed: No  ____________________________________________   INITIAL IMPRESSION / ASSESSMENT AND PLAN / ED COURSE  As part of my medical decision making, I reviewed the following data within the [place] notes reviewed and incorporated, Labs reviewed, Radiograph reviewed and Notes from prior ED visits.   31 year old female G1 P0 approximately [redacted] weeks pregnant by dates presenting with pelvic pain on urination and intercourse.  Also a secondary complaint of knot under her right armpit which clinically seems like a lipoma.  Laboratory results remarkable for leukocytosis.  Will obtain pelvic ultrasound and soft tissue ultrasound of right axillary growth.  Clinical Course as of Nov 20 549  Sun Nov 20, 2017  0247 Updated patient on ultrasound/DNA results.  Will discharge home with MetroGel and she will follow closely with her OB/GYN.  Strict return precautions given.  Patient verbalizes understanding and agrees with plan of care.  [JS]      Clinical Course User Index [JS] Paulette Blanch, MD     ____________________________________________   FINAL CLINICAL IMPRESSION(S) / ED DIAGNOSES  Final diagnoses:  Lump in armpit, right  Lipoma of right upper extremity  Abdominal pain during pregnancy, first trimester  BV (bacterial vaginosis)     ED Discharge Orders        Ordered    metroNIDAZOLE (METROGEL VAGINAL) 0.75 % vaginal gel  Daily at bedtime     11/20/17 0249       Note:  This document was prepared using Dragon voice recognition software and may include unintentional dictation errors.    Paulette Blanch, MD 11/20/17 [id number]

## 2017-11-19 NOTE — ED Triage Notes (Signed)
Pt arrives ambulatory to triage with c/o pelvic pain upon urination and intercourse and a knot  Under her right arm. Pt states that her PCP evaluated the knot and told her that it was fatty tissue but pt reports that the area has grown. Pt is in NAD.

## 2017-11-20 ENCOUNTER — Emergency Department: Payer: Managed Care, Other (non HMO)

## 2017-11-20 DIAGNOSIS — O26891 Other specified pregnancy related conditions, first trimester: Secondary | ICD-10-CM | POA: Diagnosis not present

## 2017-11-20 LAB — CHLAMYDIA/NGC RT PCR (ARMC ONLY)
Chlamydia Tr: NOT DETECTED
N gonorrhoeae: NOT DETECTED

## 2017-11-20 LAB — WET PREP, GENITAL
SPERM: NONE SEEN
TRICH WET PREP: NONE SEEN
YEAST WET PREP: NONE SEEN

## 2017-11-20 MED ORDER — METRONIDAZOLE 0.75 % VA GEL: 1.0000 | Freq: Every day | VAGINAL | 1 refills | Status: AC

## 2017-11-20 NOTE — Discharge Instructions (Signed)
1.  Take antibiotic as prescribed (MetroGel-Vaginal insert nightly x 5 nights). 2.  Return to the ER for worsening symptoms, persistent vomiting, difficulty breathing or other concerns.

## 2017-11-20 NOTE — ED Notes (Signed)
Pt is back from US at this time

## 2017-11-20 NOTE — ED Notes (Signed)
Patient transported to Ultrasound 

## 2017-11-20 NOTE — ED Notes (Signed)
Pt is ambulatory without difficulty to POV. NAD, VSS, skin WNL, resp non-labored and equal. No questions or concerned voiced during discharge instructions.

## 2017-11-29 ENCOUNTER — Telehealth: Payer: Self-pay | Admitting: Emergency Medicine

## 2017-11-29 NOTE — Telephone Encounter (Signed)
Patient had left message for me this am.  Has not filled the flagyl cream prescribed on 12/1 due to cost--not covered by insurance.  She is asking if health department could treat because she has an std.  I explained her diagnosis of bacterial vaginosis.  I told her she could go to health department, and that I believe they will be open by 11am.  She then [facility].  I told her to go to her appoinemnt and explain that she never got her rx, and they can advise her how to get the medication.

## 2017-11-30 ENCOUNTER — Encounter: Payer: Self-pay | Admitting: Medical

## 2017-11-30 ENCOUNTER — Ambulatory Visit (INDEPENDENT_AMBULATORY_CARE_PROVIDER_SITE_OTHER): Payer: Managed Care, Other (non HMO) | Admitting: Medical

## 2017-11-30 VITALS — BP 127/63 | HR 80 | Wt 161.0 lb

## 2017-11-30 DIAGNOSIS — O99519 Diseases of the respiratory system complicating pregnancy, unspecified trimester: Secondary | ICD-10-CM

## 2017-11-30 DIAGNOSIS — B9689 Other specified bacterial agents as the cause of diseases classified elsewhere: Secondary | ICD-10-CM

## 2017-11-30 DIAGNOSIS — Z3401 Encounter for supervision of normal first pregnancy, first trimester: Secondary | ICD-10-CM

## 2017-11-30 DIAGNOSIS — F1910 Other psychoactive substance abuse, uncomplicated: Secondary | ICD-10-CM

## 2017-11-30 DIAGNOSIS — J45909 Unspecified asthma, uncomplicated: Secondary | ICD-10-CM | POA: Insufficient documentation

## 2017-11-30 DIAGNOSIS — N760 Acute vaginitis: Secondary | ICD-10-CM

## 2017-11-30 DIAGNOSIS — Z3400 Encounter for supervision of normal first pregnancy, unspecified trimester: Secondary | ICD-10-CM | POA: Insufficient documentation

## 2017-11-30 DIAGNOSIS — O99330 Smoking (tobacco) complicating pregnancy, unspecified trimester: Secondary | ICD-10-CM | POA: Insufficient documentation

## 2017-11-30 DIAGNOSIS — O99320 Drug use complicating pregnancy, unspecified trimester: Secondary | ICD-10-CM

## 2017-11-30 DIAGNOSIS — O99331 Smoking (tobacco) complicating pregnancy, first trimester: Secondary | ICD-10-CM

## 2017-11-30 LAB — POCT URINALYSIS DIP (DEVICE)
Bilirubin Urine: NEGATIVE
Glucose, UA: NEGATIVE mg/dL
HGB URINE DIPSTICK: NEGATIVE
Ketones, ur: NEGATIVE mg/dL
NITRITE: NEGATIVE
PH: 7 (ref 5.0–8.0)
PROTEIN: NEGATIVE mg/dL
SPECIFIC GRAVITY, URINE: 1.015 (ref 1.005–1.030)
UROBILINOGEN UA: 0.2 mg/dL (ref 0.0–1.0)

## 2017-11-30 MED ORDER — METRONIDAZOLE 500 MG PO TABS: 500.0000 mg | ORAL_TABLET | Freq: Two times a day (BID) | ORAL | 0 refills | Status: DC

## 2017-11-30 NOTE — Progress Notes (Signed)
   PRENATAL VISIT NOTE  Subjective:  Christie Ali is a 31 y.o. G1P0000 at [redacted]w[redacted]d being seen today for first prenatal care visit.  She is currently monitored for the following issues for this low-risk pregnancy and has Supervision of low-risk first pregnancy; Tobacco use during pregnancy; Asthma during pregnancy; and Drug abuse during pregnancy [facility]) on their problem list.  Patient reports no complaints.  Contractions: Not present. Vag. Bleeding: None.  Movement: Absent. Denies leaking of fluid.   The following portions of the patient's history were reviewed and updated as appropriate: allergies, current medications, past family history, past medical history, past social history, past surgical history and problem list. Problem list updated.  Objective:   Vitals:   11/30/17 0949  BP: 127/63  Pulse: 80  Weight: 161 lb (73 kg)    Fetal Status: Fetal Heart Rate (bpm): 164   Movement: Absent     General:  Alert, oriented and cooperative. Patient is in no acute distress.  Skin: Skin is warm and dry. No rash noted.   Cardiovascular: Normal heart rate and rhythm noted  Respiratory: Normal respiratory effort, no problems with respiration noted. Clear to ascultation.   Abdomen: Soft, gravid, appropriate for gestational age.  Pain/Pressure: Absent    Normal bowel sounds. Non-tender  Pelvic: Cervical exam deferred        Breast: symmetric, non-tender. No palpable masses. No nipple discharge.   Extremities: Normal range of motion.  Edema: None  Mental Status:  Normal mood and affect. Normal behavior. Normal judgment and thought content.   Assessment and Plan:  Pregnancy: G1P0000 at [redacted]w[redacted]d  1. Encounter for supervision of low-risk first pregnancy in first trimester - Obstetric Panel, Including HIV - Hemoglobinopathy Evaluation - Cystic fibrosis gene test - Culture, OB Urine - [place] MFM Fetal Nuchal Translucency; Future - Enroll patient in Babyscripts Program  2. Maternal tobacco use in  first trimester - Safe quitting practices advised   3. Asthma during pregnancy - Patient denies need for inhaler regularly at this time  4. Drug abuse during pregnancy ([place])  5. BV (bacterial vaginosis) - Patient had been prescribed Metrogel and was unable to afford it - metroNIDAZOLE (FLAGYL) 500 MG tablet; Take 1 tablet (500 mg total) by mouth 2 (two) times daily.  Dispense: 14 tablet; Refill: 0  First trimester warning symptoms and general obstetric precautions including but not limited to vaginal bleeding, contractions, leaking of fluid and fetal movement were reviewed in detail with the patient. Please refer to After Visit Summary for other counseling recommendations.  Return in about 4 weeks (around 12/28/2017) for LOB.  Kerry Hough, PA-C

## 2017-11-30 NOTE — Progress Notes (Signed)
Pt. Reports pain under right armpit, worsening over time- was told in ER has Lipoma. Pt. Recently positive of BV, Was not tx due to cost.

## 2017-11-30 NOTE — Patient Instructions (Addendum)
First Trimester of Pregnancy The first trimester of pregnancy is from week 1 until the end of week 13 (months 1 through 3). During this time, your baby will begin to develop inside you. At 6-8 weeks, the eyes and face are formed, and the heartbeat can be seen on ultrasound. At the end of 12 weeks, all the baby's organs are formed. Prenatal care is all the medical care you receive before the birth of your baby. Make sure you get good prenatal care and follow all of your doctor's instructions. Follow these instructions at home: Medicines  Take over-the-counter and prescription medicines only as told by your doctor. Some medicines are safe and some medicines are not safe during pregnancy.  Take a prenatal vitamin that contains at least 600 micrograms (mcg) of folic acid.  If you have trouble pooping (constipation), take medicine that will make your stool soft (stool softener) if your doctor approves. Eating and drinking  Eat regular, healthy meals.  Your doctor will tell you the amount of weight gain that is right for you.  Avoid raw meat and uncooked cheese.  If you feel sick to your stomach (nauseous) or throw up (vomit): ? Eat 4 or 5 small meals a day instead of 3 large meals. ? Try eating a few soda crackers. ? Drink liquids between meals instead of during meals.  To prevent constipation: ? Eat foods that are high in fiber, like fresh fruits and vegetables, whole grains, and beans. ? Drink enough fluids to keep your pee (urine) clear or pale yellow. Activity  Exercise only as told by your doctor. Stop exercising if you have cramps or pain in your lower belly (abdomen) or low back.  Do not exercise if it is too hot, too humid, or if you are in a place of great height (high altitude).  Try to avoid standing for long periods of time. Move your legs often if you must stand in one place for a long time.  Avoid heavy lifting.  Wear low-heeled shoes. Sit and stand up straight.  You  can have sex unless your doctor tells you not to. Relieving pain and discomfort  Wear a good support bra if your breasts are sore.  Take warm water baths (sitz baths) to soothe pain or discomfort caused by hemorrhoids. Use hemorrhoid cream if your doctor says it is okay.  Rest with your legs raised if you have leg cramps or low back pain.  If you have puffy, bulging veins (varicose veins) in your legs: ? Wear support hose or compression stockings as told by your doctor. ? Raise (elevate) your feet for 15 minutes, 3-4 times a day. ? Limit salt in your food. Prenatal care  Schedule your prenatal visits by the twelfth week of pregnancy.  Write down your questions. Take them to your prenatal visits.  Keep all your prenatal visits as told by your doctor. This is important. Safety  Wear your seat belt at all times when driving.  Make a list of emergency phone numbers. The list should include numbers for family, friends, the hospital, and police and fire departments. General instructions  Ask your doctor for a referral to a local prenatal class. Begin classes no later than at the start of month 6 of your pregnancy.  Ask for help if you need counseling or if you need help with nutrition. Your doctor can give you advice or tell you where to go for help.  Do not use hot tubs, steam rooms, or  saunas.  Do not douche or use tampons or scented sanitary pads.  Do not cross your legs for long periods of time.  Avoid all herbs and alcohol. Avoid drugs that are not approved by your doctor.  Do not use any tobacco products, including cigarettes, chewing tobacco, and electronic cigarettes. If you need help quitting, ask your doctor. You may get counseling or other support to help you quit.  Avoid cat litter boxes and soil used by cats. These carry germs that can cause birth defects in the baby and can cause a loss of your baby (miscarriage) or stillbirth.  Visit your dentist. At home, brush  your teeth with a soft toothbrush. Be gentle when you floss. Contact a doctor if:  You are dizzy.  You have mild cramps or pressure in your lower belly.  You have a nagging pain in your belly area.  You continue to feel sick to your stomach, you throw up, or you have watery poop (diarrhea).  You have a bad smelling fluid coming from your vagina.  You have pain when you pee (urinate).  You have increased puffiness (swelling) in your face, hands, legs, or ankles. Get help right away if:  You have a fever.  You are leaking fluid from your vagina.  You have spotting or bleeding from your vagina.  You have very bad belly cramping or pain.  You gain or lose weight rapidly.  You throw up blood. It may look like coffee grounds.  You are around people who have [place] measles, fifth disease, or chickenpox.  You have a very bad headache.  You have shortness of breath.  You have any kind of trauma, such as from a fall or a car accident. Summary  The first trimester of pregnancy is from week 1 until the end of week 13 (months 1 through 3).  To take care of yourself and your unborn baby, you will need to eat healthy meals, take medicines only if your doctor tells you to do so, and do activities that are safe for you and your baby.  Keep all follow-up visits as told by your doctor. This is important as your doctor will have to ensure that your baby is healthy and growing well. This information is not intended to replace advice given to you by your health care provider. Make sure you discuss any questions you have with your health care provider. Document Released: 05/24/2008 Document Revised: 12/14/2016 Document Reviewed: 12/14/2016 Elsevier Interactive Patient Education  2017 [place] Education Options: [facility] Classes:  Childbirth education classes can help you get ready for a positive parenting experience. You can also meet other  expectant parents and get free stuff for your baby. Each class runs for five weeks on the same night and costs $45 for the mother-to-be and her support person. Medicaid covers the cost if you are eligible. Call [phone] to register. [facility] Childbirth Education:  [phone] or [phone] or [email or web address]  Baby & Me Class: Discuss newborn & infant parenting and family adjustment issues with other new mothers in a relaxed environment. Each week brings a new speaker or baby-centered activity. We encourage new mothers to join [place] every Thursday at 11:00am. Babies birth until crawling. No registration or fee. Daddy [employer]: This course offers Dads-to-be the tools and knowledge needed to feel confident on their journey to becoming new fathers. Experienced dads, who have been trained as coaches, teach dads-to-be how to hold, comfort, diaper, swaddle and  play with their infant while being able to support the new mom as well. A class for men taught by men. $25/dad Big Brother/Big Sister: Let your children share in the joy of a new brother or sister in this special class designed just for them. Class includes discussion about how families care for babies: swaddling, holding, diapering, safety as well as how they can be helpful in their new role. This class is designed for children ages 8 to 76, but any age is welcome. Please register each child individually. $5/child  Mom Talk: This mom-led group offers support and connection to mothers as they journey through the adjustments and struggles of that sometimes overwhelming first year after the birth of a child. Tuesdays at 10:00am and Thursdays at 6:00pm. Babies welcome. No registration or fee. Breastfeeding Support Group: This group is a mother-to-mother support circle where moms have the opportunity to share their breastfeeding experiences. A Lactation Consultant is present for questions and concerns. Meets each Tuesday at 11:00am. No fee  or registration. Breastfeeding Your Baby: Learn what to expect in the first days of breastfeeding your newborn.  This class will help you feel more confident with the skills needed to begin your breastfeeding experience. Many new mothers are concerned about breastfeeding after leaving the hospital. This class will also address the most common fears and challenges about breastfeeding during the first few weeks, months and beyond. (call for fee) Comfort Techniques and Tour: This 2 hour interactive class will provide you the opportunity to learn & practice hands-on techniques that can help relieve some of the discomfort of labor and encourage your baby to rotate toward the best position for birth. You and your partner will be able to try a variety of labor positions with birth balls and rebozos as well as practice breathing, relaxation, and visualization techniques. A tour of the [facility] is included with this class. $20 per registrant and support person Childbirth Class- Weekend Option: This class is a Weekend version of our Birth & Baby series. It is designed for parents who have a difficult time fitting several weeks of classes into their schedule. It covers the care of your newborn and the basics of labor and childbirth. It also includes a [place] [facility] and lunch. The class is held two consecutive days: beginning on Friday evening from 6:30 - 8:30 p.m. and the next day, Saturday from 9 a.m. - 4 p.m. (call for fee) Doren Custard Class: Interested in a waterbirth?  This informational class will help you discover whether waterbirth is the right fit for you. Education about waterbirth itself, supplies you would need and how to assemble your support team is what you can expect from this class. Some obstetrical practices require this class in order to pursue a waterbirth. (Not all obstetrical practices offer waterbirth-check with your healthcare provider.)  Register only the expectant mom, but you are encouraged to bring your partner to class! Required if planning waterbirth, no fee. Infant/Child CPR: Parents, grandparents, babysitters, and friends learn Cardio-Pulmonary Resuscitation skills for infants and children. You will also learn how to treat both conscious and unconscious choking in infants and children. This Family & Friends program does not offer certification. Register each participant individually to ensure that enough mannequins are available. (Call for fee) Grandparent Love: Expecting a grandbaby? This class is for you! Learn about the latest infant care and safety recommendations and ways to support your own child as he or she transitions into  the parenting role. Taught by Registered Nurses who are childbirth instructors, but most importantly...they are grandmothers too! $10/person. Childbirth Class- Natural Childbirth: This series of 5 weekly classes is for expectant parents who want to learn and practice natural methods of coping with the process of labor and childbirth. Relaxation, breathing, massage, visualization, role of the partner, and helpful positioning are highlighted. Participants learn how to be confident in their body's ability to give birth. This class will empower and help parents make informed decisions about their own care. Includes discussion that will help new parents transition into the immediate postpartum period. [facility] is included. We suggest taking this class between 25-32 weeks, but it's only a recommendation. $75 per registrant and one support person or $30 Medicaid. Childbirth Class- 3 week Series: This option of 3 weekly classes helps you and your labor partner prepare for childbirth. Newborn care, labor & birth, cesarean birth, pain management, and comfort techniques are discussed and a [place] [facility] is included. The class meets at the same time, on  the same day of the week for 3 consecutive weeks beginning with the starting date you choose. $60 for registrant and one support person.  Marvelous Multiples: Expecting twins, triplets, or more? This class covers the differences in labor, birth, parenting, and breastfeeding issues that face multiples' parents. NICU tour is included. Led by a Certified Childbirth Educator who is the mother of twins. No fee. Caring for Baby: This class is for expectant and adoptive parents who want to learn and practice the most up-to-date newborn care for their babies. Focus is on birth through the first six weeks of life. Topics include feeding, bathing, diapering, crying, umbilical cord care, circumcision care and safe sleep. Parents learn to recognize symptoms of illness and when to call the pediatrician. Register only the mom-to-be and your partner or support person can plan to come with you! $10 per registrant and support person Childbirth Class- online option: This online class offers you the freedom to complete a Birth and Baby series in the comfort of your own home. The flexibility of this option allows you to review sections at your own pace, at times convenient to you and your support people. It includes additional video information, animations, quizzes, and extended activities. Get organized with helpful eClass tools, checklists, and trackers. Once you register online for the class, you will receive an email within a few days to accept the invitation and begin the class when the time is right for you. The content will be available to you for 60 days. $60 for 60 days of online access for you and your support people.  Local Doulas: Natural Baby Doulas [email or web address] Tel: [phone] [email or web address] [employer] [phone] [email or web address] The Labor Hassell Halim  (also do waterbirth tub  rental) [phone] [email or web address] [facility] Birth Doula [phone] [email or web address].fi [facility] Rhythms  [phone] [email or web address] [employer] (PADA) [email or web address] [place] Birth and Baby  [email or web address] Considering Waterbirth? Guide for patients at [facility] [employer]  Why consider waterbirth?  . Gentle birth for babies . Less pain medicine used in labor . May allow for passive descent/less pushing . May reduce perineal tears  . More mobility and instinctive maternal position changes . Increased maternal relaxation . Reduced blood pressure in labor  Is waterbirth safe? What are the risks of infection, drowning or other complications?  . Infection: o Very low risk (3.7 % for tub vs 4.8% for bed)  o 7 in 64 waterbirths with documented infection o Poorly cleaned equipment most common cause o Slightly lower group B strep transmission rate  . Drowning o Maternal:  - Very low risk   - Related to seizures or fainting o Newborn:  - Very low risk. No evidence of increased risk of respiratory problems in multiple large studies - Physiological protection from breathing under water - Avoid underwater birth if there are any fetal complications - Once baby's head is out of the water, keep it out.  . Birth complication o Some reports of cord trauma, but risk decreased by bringing baby to surface gradually o No evidence of increased risk of shoulder dystocia. Mothers can usually change positions faster in water than in a bed, possibly aiding the maneuvers to free the shoulder.   You must attend a Doren Custard class at [facility]  3rd Wednesday of every month from 7-9pm  [employer] by calling [phone] or online at [email or web address]  Bring [place] the certificate from the class to  your prenatal appointment  Meet with a midwife at 36 weeks to see if you can still plan a waterbirth and to sign the consent.   Purchase or rent the following supplies:   Water Birth Pool (Birth Pool in a Box or [place] for instance)  (Tubs start ~$125)  Single-use disposable tub liner designed for your brand of tub  New garden hose labeled "lead-free", "suitable for drinking water",  Electric drain pump to remove water (We recommend 792 gallon per hour or greater pump.)   Separate garden hose to remove the dirty water  Fish net  Bathing suit top (optional)  Long-handled mirror (optional)  Places to purchase or rent supplies  GotWebTools.is for tub purchases and supplies  Waterbirthsolutions.com for tub purchases and supplies  The Labor Ladies (www.[email or web address]) $275 for tub rental/set-up & take down/kit   [employer] (http://www.[place].com/.htm) Information regarding doulas (labor support) who provide pool rentals  Our practice has a Birth Pool in a Box tub at the hospital that you may borrow on a first-come-first-served basis. It is your responsibility to to set up, clean and break down the tub. We cannot guarantee the availability of this tub in advance. You are responsible for bringing all accessories listed above. If you do not have all necessary supplies you cannot have a waterbirth.    Things that would prevent you from having a waterbirth:  Premature, <37wks  Previous cesarean birth  Presence of thick meconium-stained fluid  Multiple gestation (Twins, triplets, etc.)  Uncontrolled diabetes or gestational diabetes requiring medication  Hypertension requiring medication or diagnosis of pre-eclampsia  Heavy vaginal bleeding  Non-reassuring fetal heart rate  Active infection (MRSA, etc.). Group B Strep is NOT a contraindication for  waterbirth.  If your labor has to be induced and induction method requires continuous   monitoring of the baby's heart rate  Other risks/issues identified by your obstetrical provider  Please remember that birth is unpredictable. Under certain unforeseeable circumstances your provider may advise against giving birth in the tub. These decisions will be made on a case-by-case basis and with the safety of you and your baby as our highest priority.

## 2017-12-02 LAB — URINE CULTURE, OB REFLEX

## 2017-12-02 LAB — CULTURE, OB URINE

## 2017-12-07 ENCOUNTER — Encounter (HOSPITAL_COMMUNITY): Payer: Self-pay | Admitting: Medical

## 2017-12-07 LAB — OBSTETRIC PANEL, INCLUDING HIV
Antibody Screen: NEGATIVE
Basophils Absolute: 0 10*3/uL (ref 0.0–0.2)
Basos: 0 %
EOS (ABSOLUTE): 0.3 10*3/uL (ref 0.0–0.4)
EOS: 2 %
HEMOGLOBIN: 13.9 g/dL (ref 11.1–15.9)
HEP B S AG: NEGATIVE
HIV Screen 4th Generation wRfx: NONREACTIVE
Hematocrit: 38 % (ref 34.0–46.6)
IMMATURE GRANS (ABS): 0 10*3/uL (ref 0.0–0.1)
IMMATURE GRANULOCYTES: 0 %
LYMPHS: 22 %
Lymphocytes Absolute: 3 10*3/uL (ref 0.7–3.1)
MCH: 33.8 pg — ABNORMAL HIGH (ref 26.6–33.0)
MCHC: 36.6 g/dL — AB (ref 31.5–35.7)
MCV: 93 fL (ref 79–97)
MONOS ABS: 0.7 10*3/uL (ref 0.1–0.9)
Monocytes: 6 %
NEUTROS PCT: 70 %
Neutrophils Absolute: 9.4 10*3/uL — ABNORMAL HIGH (ref 1.4–7.0)
Platelets: 285 10*3/uL (ref 150–379)
RBC: 4.11 x10E6/uL (ref 3.77–5.28)
RDW: 13.2 % (ref 12.3–15.4)
RPR Ser Ql: NONREACTIVE
Rh Factor: POSITIVE
Rubella Antibodies, IGG: <0.9 index — ABNORMAL LOW (ref >0.99)
WBC: 13.5 10*3/uL — AB (ref 3.4–10.8)

## 2017-12-07 LAB — HEMOGLOBINOPATHY EVALUATION
FERRITIN: 47 ng/mL (ref 15–150)
HGB F QUANT: 1.5 % (ref 0.0–2.0)
HGB SOLUBILITY: NEGATIVE
Hgb A2 Quant: 2.2 % (ref 1.8–3.2)
Hgb A: 96.3 % — ABNORMAL LOW (ref 96.4–98.8)
Hgb C: 0 %
Hgb S: 0 %
Hgb Variant: 0 %

## 2017-12-07 LAB — CYSTIC FIBROSIS GENE TEST

## 2017-12-14 ENCOUNTER — Encounter: Payer: Self-pay | Admitting: Medical

## 2017-12-16 ENCOUNTER — Ambulatory Visit (HOSPITAL_COMMUNITY)
Admission: RE | Admit: 2017-12-16 | Discharge: 2017-12-16 | Disposition: A | Discharge status: Home / Self Care | Payer: Managed Care, Other (non HMO) | Source: Ambulatory Visit | Attending: Medical | Admitting: Medical

## 2017-12-16 ENCOUNTER — Telehealth: Payer: Self-pay | Admitting: General Practice

## 2017-12-16 ENCOUNTER — Encounter (HOSPITAL_COMMUNITY): Payer: Self-pay

## 2017-12-16 DIAGNOSIS — Z3401 Encounter for supervision of normal first pregnancy, first trimester: Secondary | ICD-10-CM | POA: Diagnosis not present

## 2017-12-16 DIAGNOSIS — Z3682 Encounter for antenatal screening for nuchal translucency: Secondary | ICD-10-CM | POA: Insufficient documentation

## 2017-12-16 DIAGNOSIS — J45909 Unspecified asthma, uncomplicated: Secondary | ICD-10-CM

## 2017-12-16 DIAGNOSIS — Z3A13 13 weeks gestation of pregnancy: Secondary | ICD-10-CM | POA: Diagnosis not present

## 2017-12-16 DIAGNOSIS — O99331 Smoking (tobacco) complicating pregnancy, first trimester: Secondary | ICD-10-CM

## 2017-12-16 DIAGNOSIS — O99519 Diseases of the respiratory system complicating pregnancy, unspecified trimester: Secondary | ICD-10-CM

## 2017-12-16 DIAGNOSIS — O288 Other abnormal findings on antenatal screening of mother: Secondary | ICD-10-CM | POA: Diagnosis not present

## 2017-12-16 NOTE — Telephone Encounter (Signed)
Patient called and left message on nurse line stating she saw her mychart stating she had + RH factor and she wants to know what that means. Called patient back and explained that means if she has a + or - blood type. Patient verbalized understanding & had no questions

## 2017-12-20 NOTE — L&D Delivery Note (Signed)
Delivery Note Pt was induced for gHTN using cervical foley, Pit, then AROM. Pt became complete at 0141 and labored down for a short time; she pushed well and at 2:56 AM a viable female was delivered via Vaginal, Spontaneous (Presentation: LOA).  APGAR: 7, 9; weight: pending.  Infant dried and placed on pt's abd; cord clamped and cut by FOB after 1 min; hospital cord blood sample collected. Placenta status: spont ,intact .  Cord: 3 vessel  Anesthesia:  Epidural Episiotomy: None Lacerations: None Est. Blood Loss (mL): 50  Mom to postpartum.  Baby to Couplet care / Skin to Skin.  Rec MMR vax postpartum.  Serita Grammes CNM 06/22/2018, 3:26 AM  Please schedule this patient for Postpartum visit in: 1 week with the following provider: Any provider, for BP check; then 4 wk pp visit For C/S patients schedule nurse incision check in weeks 2 weeks: no High risk pregnancy complicated by: gHTN Delivery mode:  SVD Anticipated Birth Control:  Depo PP Procedures needed: BP check  Schedule Integrated [place] visit: no

## 2017-12-21 ENCOUNTER — Other Ambulatory Visit: Payer: Self-pay

## 2017-12-26 ENCOUNTER — Telehealth: Payer: Self-pay | Admitting: General Practice

## 2017-12-26 NOTE — Telephone Encounter (Signed)
Patient called and left message on nurse line stating she needs a refill on her BV medication. Patient thinks she has it again. Patient states she has an appt tomorrow. Called patient and discussed bringing that up at her visit tomorrow for the provider to assess if recollection is needed or if refill can just be sent. Patient verbalized understanding to all & had no questions

## 2017-12-27 ENCOUNTER — Ambulatory Visit (INDEPENDENT_AMBULATORY_CARE_PROVIDER_SITE_OTHER): Payer: Medicaid Other | Admitting: Medical

## 2017-12-27 ENCOUNTER — Encounter: Payer: Self-pay | Admitting: Medical

## 2017-12-27 ENCOUNTER — Encounter: Payer: Self-pay | Admitting: *Deleted

## 2017-12-27 ENCOUNTER — Other Ambulatory Visit (HOSPITAL_COMMUNITY)
Admission: RE | Admit: 2017-12-27 | Discharge: 2017-12-27 | Disposition: A | Discharge status: Home / Self Care | Payer: Medicaid Other | Source: Ambulatory Visit | Attending: Medical | Admitting: Medical

## 2017-12-27 VITALS — BP 106/64 | HR 80 | Wt 163.0 lb

## 2017-12-27 DIAGNOSIS — Z3402 Encounter for supervision of normal first pregnancy, second trimester: Secondary | ICD-10-CM

## 2017-12-27 DIAGNOSIS — Z3A14 14 weeks gestation of pregnancy: Secondary | ICD-10-CM | POA: Insufficient documentation

## 2017-12-27 DIAGNOSIS — N898 Other specified noninflammatory disorders of vagina: Secondary | ICD-10-CM | POA: Diagnosis present

## 2017-12-27 DIAGNOSIS — O26892 Other specified pregnancy related conditions, second trimester: Secondary | ICD-10-CM

## 2017-12-27 DIAGNOSIS — O23592 Infection of other part of genital tract in pregnancy, second trimester: Secondary | ICD-10-CM | POA: Diagnosis not present

## 2017-12-27 DIAGNOSIS — Z3401 Encounter for supervision of normal first pregnancy, first trimester: Secondary | ICD-10-CM

## 2017-12-27 LAB — POCT URINALYSIS DIP (DEVICE)
Bilirubin Urine: NEGATIVE
Glucose, UA: NEGATIVE mg/dL
HGB URINE DIPSTICK: NEGATIVE
Ketones, ur: NEGATIVE mg/dL
Leukocytes, UA: NEGATIVE
Nitrite: NEGATIVE
PH: 7 (ref 5.0–8.0)
PROTEIN: NEGATIVE mg/dL
SPECIFIC GRAVITY, URINE: 1.02 (ref 1.005–1.030)
UROBILINOGEN UA: 0.2 mg/dL (ref 0.0–1.0)

## 2017-12-27 NOTE — Patient Instructions (Signed)
Second Trimester of Pregnancy The second trimester is from week 13 through week 28, month 4 through 6. This is often the time in pregnancy that you feel your best. Often times, morning sickness has lessened or quit. You may have more energy, and you may get hungry more often. Your unborn baby (fetus) is growing rapidly. At the end of the sixth month, he or she is about 9 inches long and weighs about 1 pounds. You will likely feel the baby move (quickening) between 18 and 20 weeks of pregnancy. Follow these instructions at home:  Avoid all smoking, herbs, and alcohol. Avoid drugs not approved by your doctor.  Do not use any tobacco products, including cigarettes, chewing tobacco, and electronic cigarettes. If you need help quitting, ask your doctor. You may get counseling or other support to help you quit.  Only take medicine as told by your doctor. Some medicines are safe and some are not during pregnancy.  Exercise only as told by your doctor. Stop exercising if you start having cramps.  Eat regular, healthy meals.  Wear a good support bra if your breasts are tender.  Do not use hot tubs, steam rooms, or saunas.  Wear your seat belt when driving.  Avoid raw meat, uncooked cheese, and liter boxes and soil used by cats.  Take your prenatal vitamins.  Take 1500-2000 milligrams of calcium daily starting at the 20th week of pregnancy until you deliver your baby.  Try taking medicine that helps you poop (stool softener) as needed, and if your doctor approves. Eat more fiber by eating fresh fruit, vegetables, and whole grains. Drink enough fluids to keep your pee (urine) clear or pale yellow.  Take warm water baths (sitz baths) to soothe pain or discomfort caused by hemorrhoids. Use hemorrhoid cream if your doctor approves.  If you have puffy, bulging veins (varicose veins), wear support hose. Raise (elevate) your feet for 15 minutes, 3-4 times a day. Limit salt in your diet.  Avoid heavy  lifting, wear low heals, and sit up straight.  Rest with your legs raised if you have leg cramps or low back pain.  Visit your dentist if you have not gone during your pregnancy. Use a soft toothbrush to brush your teeth. Be gentle when you floss.  You can have sex (intercourse) unless your doctor tells you not to.  Go to your doctor visits. Get help if:  You feel dizzy.  You have mild cramps or pressure in your lower belly (abdomen).  You have a nagging pain in your belly area.  You continue to feel sick to your stomach (nauseous), throw up (vomit), or have watery poop (diarrhea).  You have bad smelling fluid coming from your vagina.  You have pain with peeing (urination). Get help right away if:  You have a fever.  You are leaking fluid from your vagina.  You have spotting or bleeding from your vagina.  You have severe belly cramping or pain.  You lose or gain weight rapidly.  You have trouble catching your breath and have chest pain.  You notice sudden or extreme puffiness (swelling) of your face, hands, ankles, feet, or legs.  You have not felt the baby move in over an hour.  You have severe headaches that do not go away with medicine.  You have vision changes. This information is not intended to replace advice given to you by your health care provider. Make sure you discuss any questions you have with your health care   provider. Document Released: 03/02/2010 Document Revised: 05/13/2016 Document Reviewed: 02/06/2013 Elsevier Interactive Patient Education  2017 [employer]  

## 2017-12-27 NOTE — Progress Notes (Signed)
Pt stated having white discharge, with odor.

## 2017-12-27 NOTE — Progress Notes (Signed)
   PRENATAL VISIT NOTE  Subjective:  Christie Ali is a 32 y.o. G1P0000 at [redacted]w[redacted]d being seen today for ongoing prenatal care.  She is currently monitored for the following issues for this low-risk pregnancy and has Supervision of low-risk first pregnancy; Tobacco use during pregnancy; Asthma during pregnancy; and Drug abuse during pregnancy [facility]) on their problem list.  Patient reports vaginal discharge. Patient states recently treated for BV with Flagyl. Completed course. Now having white discharge with odor but only following intercourse. No itching or irritation.  Contractions: Not present. Vag. Bleeding: None.  Movement: Absent. Denies leaking of fluid.   The following portions of the patient's history were reviewed and updated as appropriate: allergies, current medications, past family history, past medical history, past social history, past surgical history and problem list. Problem list updated.  Objective:   Vitals:   12/27/17 1411  BP: 106/64  Pulse: 80  Weight: 163 lb (73.9 kg)    Fetal Status: Fetal Heart Rate (bpm): 158   Movement: Absent     General:  Alert, oriented and cooperative. Patient is in no acute distress.  Skin: Skin is warm and dry. No rash noted.   Cardiovascular: Normal heart rate noted  Respiratory: Normal respiratory effort, no problems with respiration noted  Abdomen: Soft, gravid, appropriate for gestational age.  Pain/Pressure: Absent     Pelvic: Cervical exam deferred        Small amount of clear discharge noted. Patient states intercourse just prior to arrival. No bleeding noted. Sample obtained.   Extremities: Normal range of motion.  Edema: None  Mental Status:  Normal mood and affect. Normal behavior. Normal judgment and thought content.   Assessment and Plan:  Pregnancy: G1P0000 at [redacted]w[redacted]d  1. Encounter for supervision of normal first pregnancy in second trimester - Doing well  - AFP at next visit   3. Vaginal discharge in pregnancy in  second trimester - Cervicovaginal ancillary only  Preterm labor/ second trimester warning symptoms and general obstetric precautions including but not limited to vaginal bleeding, contractions, leaking of fluid and fetal movement were reviewed in detail with the patient. Please refer to After Visit Summary for other counseling recommendations.  Return in about 4 weeks (around 01/24/2018) for LOB.   Kerry Hough, PA-C

## 2017-12-29 LAB — CERVICOVAGINAL ANCILLARY ONLY
Bacterial vaginitis: POSITIVE — AB
Candida vaginitis: NEGATIVE
Chlamydia: NEGATIVE
Neisseria Gonorrhea: NEGATIVE
Trichomonas: NEGATIVE

## 2017-12-30 ENCOUNTER — Other Ambulatory Visit: Payer: Medicaid Other

## 2017-12-30 DIAGNOSIS — Z3401 Encounter for supervision of normal first pregnancy, first trimester: Secondary | ICD-10-CM

## 2018-01-03 ENCOUNTER — Encounter: Payer: Self-pay | Admitting: *Deleted

## 2018-01-04 LAB — AFP, SERUM, OPEN SPINA BIFIDA
AFP MOM: 0.96
AFP Value: 26.4 ng/mL
Gest. Age on Collection Date: 15.1 weeks
MATERNAL AGE AT EDD: 31 a
OSBR Risk 1 IN: 10000
TEST RESULTS AFP: NEGATIVE
Weight: 163 [lb_av]

## 2018-01-16 ENCOUNTER — Other Ambulatory Visit: Payer: Self-pay

## 2018-01-16 ENCOUNTER — Emergency Department: Payer: Medicaid Other

## 2018-01-16 ENCOUNTER — Encounter: Payer: Self-pay | Admitting: Emergency Medicine

## 2018-01-16 ENCOUNTER — Emergency Department
Admission: EM | Admit: 2018-01-16 | Discharge: 2018-01-16 | Disposition: A | Discharge status: Home / Self Care | Payer: Medicaid Other | Attending: Emergency Medicine | Admitting: Emergency Medicine

## 2018-01-16 DIAGNOSIS — R1032 Left lower quadrant pain: Secondary | ICD-10-CM | POA: Diagnosis not present

## 2018-01-16 DIAGNOSIS — O9989 Other specified diseases and conditions complicating pregnancy, childbirth and the puerperium: Secondary | ICD-10-CM | POA: Diagnosis not present

## 2018-01-16 DIAGNOSIS — Z79899 Other long term (current) drug therapy: Secondary | ICD-10-CM | POA: Insufficient documentation

## 2018-01-16 DIAGNOSIS — J45909 Unspecified asthma, uncomplicated: Secondary | ICD-10-CM | POA: Diagnosis not present

## 2018-01-16 DIAGNOSIS — F17210 Nicotine dependence, cigarettes, uncomplicated: Secondary | ICD-10-CM | POA: Diagnosis not present

## 2018-01-16 DIAGNOSIS — N760 Acute vaginitis: Secondary | ICD-10-CM | POA: Diagnosis not present

## 2018-01-16 DIAGNOSIS — B9689 Other specified bacterial agents as the cause of diseases classified elsewhere: Secondary | ICD-10-CM

## 2018-01-16 DIAGNOSIS — Z3492 Encounter for supervision of normal pregnancy, unspecified, second trimester: Secondary | ICD-10-CM

## 2018-01-16 LAB — URINALYSIS, COMPLETE (UACMP) WITH MICROSCOPIC
Bilirubin Urine: NEGATIVE
GLUCOSE, UA: NEGATIVE mg/dL
Hgb urine dipstick: NEGATIVE
KETONES UR: NEGATIVE mg/dL
LEUKOCYTES UA: NEGATIVE
Nitrite: NEGATIVE
PH: 6 (ref 5.0–8.0)
Protein, ur: NEGATIVE mg/dL
SPECIFIC GRAVITY, URINE: 1.014 (ref 1.005–1.030)

## 2018-01-16 MED ORDER — METRONIDAZOLE 500 MG PO TABS: 500.0000 mg | ORAL_TABLET | Freq: Two times a day (BID) | ORAL | 0 refills | Status: DC

## 2018-01-16 NOTE — Discharge Instructions (Signed)
Your ultrasound does not show any abnormalities today.

## 2018-01-16 NOTE — ED Triage Notes (Signed)
Pt with left groin pain, 8/10, that is worse when trying to stand. Pain started yesterday. Pt is [redacted] weeks pregnant. Denies drainage or bleeding from vagina.

## 2018-01-16 NOTE — ED Notes (Signed)
Pt states [redacted] weeks pregnant (18 weeks on Thursday), first pregnancy. [facility] [place] [facility]. Had appointment on the 8th. Saw in [place] that she is positive for BV but hasn't heard from doctor about medications for it and is concerned about that. Pt states she feels like baby is sitting on L side and causing sharp pains to LLQ when she stands and walks. Doesn't feel while lying down. Denies discharge, bleeding, or cramping. Alert, oriented, ambulatory. No distress noted.

## 2018-01-16 NOTE — ED Notes (Signed)
Pt threatening to leave. Pt informed that results are back and Dr. Joni Fears needs to take a look at them. Pt informed that Dr. Joni Fears is in another pt room but that this RN will talk to him about results and possibly eating a snack. Pt stating she is very hungry. If this RN is able, she will bring food to patient if Dr. Joni Fears says that is okay.

## 2018-01-16 NOTE — ED Provider Notes (Signed)
[facility] Emergency Department Provider Note  ____________________________________________  Time seen: Approximately 8:07 PM  I have reviewed the triage vital signs and the nursing notes.   HISTORY  Chief Complaint Abdominal Pain    HPI Christie Ali is a 32 y.o. female who complains of left lower quadrant abdominal pain since yesterday, intermittent lasting a few seconds at a time, worse with standing, better with sitting and resting. Nonradiating. Sharp, moderate intensity.  Patient is [redacted] weeks pregnant, no vaginal bleeding or leakage of fluid contractions. She is not yet feeling fetal movements and this pregnancy. She follows up with the health department.  She's also been diagnosed with bacterial vaginosis, but has not received any treatment for it and she had this issue and a previous pregnancy that was successfully treated with metronidazole.     Past Medical History:  Diagnosis Date  . Asthma      Patient Active Problem List   Diagnosis Date Noted  . Supervision of low-risk first pregnancy 11/30/2017  . Tobacco use during pregnancy 11/30/2017  . Asthma during pregnancy 11/30/2017  . Drug abuse during pregnancy ([place]) 11/30/2017     Past Surgical History:  Procedure Laterality Date  . NO PAST SURGERIES       Prior to Admission medications   Medication Sig Start Date End Date Taking? Authorizing Provider  ferrous sulfate 325 (65 FE) MG tablet Take 325 mg by mouth daily with breakfast.    [provider]  metroNIDAZOLE (FLAGYL) 500 MG tablet Take 1 tablet (500 mg total) by mouth 2 (two) times daily. 01/16/18   Carrie Mew, MD  Prenatal Vit-Fe Fumarate-FA (MULTIVITAMIN-PRENATAL) 27-0.8 MG TABS tablet Take 1 tablet by mouth daily at 12 noon.    [provider]     Allergies Sulfa antibiotics and Other   Family History  Problem Relation Age of Onset  . Hypertension Mother   . Hypertension Father   .  Cancer Maternal Grandmother   . Cancer Paternal Grandmother     Social History Social History   Tobacco Use  . Smoking status: Current Every Day Smoker    Packs/day: 0.25    Types: Cigarettes  . Smokeless tobacco: Never Used  Substance Use Topics  . Alcohol use: No  . Drug use: Yes    Types: Marijuana    Review of Systems  Constitutional:   No fever or chills.  ENT:   No sore throat. No rhinorrhea. Cardiovascular:   No chest pain or syncope. Respiratory:   No dyspnea or cough. Gastrointestinal:   Positive as above for abdominal pain without vomiting and diarrhea.  Musculoskeletal:   Negative for focal pain or swelling All other systems reviewed and are negative except as documented above in ROS and HPI.  ____________________________________________   PHYSICAL EXAM:  VITAL SIGNS: ED Triage Vitals  Enc Vitals Group     BP 01/16/18 1605 102/62     Pulse Rate 01/16/18 1605 69     Resp 01/16/18 1949 18     Temp 01/16/18 1605 97.7 F (36.5 C)     Temp Source 01/16/18 1605 Oral     SpO2 01/16/18 1605 100 %     Weight 01/16/18 1606 170 lb (77.1 kg)     Height 01/16/18 1606 5\' 8"  (1.727 m)     Head Circumference --      Peak Flow --      Pain Score 01/16/18 1606 8     Pain Loc --  Pain Edu? --      Excl. in [place]? --     Vital signs reviewed, nursing assessments reviewed.   Constitutional:   Alert and oriented. Well appearing and in no distress. Eyes:   No scleral icterus.  EOMI. No nystagmus. No conjunctival pallor. PERRL. ENT   Head:   Normocephalic and atraumatic.   Nose:   No congestion/rhinnorhea.    Mouth/Throat:   MMM, no pharyngeal erythema. No peritonsillar mass.    Neck:   No meningismus. Full ROM. Hematological/Lymphatic/Immunilogical:   No cervical lymphadenopathy. Cardiovascular:   RRR. Symmetric bilateral radial and DP pulses.  No murmurs.  Respiratory:   Normal respiratory effort without tachypnea/retractions. Breath sounds are clear  and equal bilaterally. No wheezes/rales/rhonchi. Gastrointestinal:   Soft, gravid abdomen. Slight left lower quadrant tenderness, not at the inguinal ligament. No evidence of hernia.. There is no CVA tenderness.  No rebound, rigidity, or guarding. Genitourinary:   deferred Musculoskeletal:   Normal range of motion in all extremities. No joint effusions.  No lower extremity tenderness.  No edema. No pain with stress of the quadriceps group and hip flexors. Neurologic:   Normal speech and language.  Motor grossly intact. No acute focal neurologic deficits are appreciated.  Skin:    Skin is warm, dry and intact. No rash noted.  No petechiae, purpura, or bullae.  ____________________________________________    LABS (pertinent positives/negatives) (all labs ordered are listed, but only abnormal results are displayed) Labs Reviewed  URINALYSIS, COMPLETE (UACMP) WITH MICROSCOPIC - Abnormal; Notable for the following components:      Result Value   Color, Urine YELLOW (*)    APPearance HAZY (*)    All other components within normal limits  URINE CULTURE   ____________________________________________   EKG    ____________________________________________    RADIOLOGY  US Ob Limited > 14 Wks  Result Date: 01/16/2018 CLINICAL DATA:  Left pelvic pain for 2 days. No bleeding. By first ultrasound patient is 17 weeks 4 days. EDC by first ultrasound is 06/22/2018. EXAM: LIMITED OBSTETRIC ULTRASOUND FINDINGS: Number of Fetuses: 1 Heart Rate:  149 bpm Movement: Present Presentation: Breech Placental Location: Anterior Previa: None Amniotic Fluid (Subjective):  Normal BPD:  2.6cm 17w 5d MATERNAL FINDINGS: Cervix:  3.9 centimeters Uterus/Adnexae: Normal in appearance. IMPRESSION: 1. Single living intrauterine fetus in breech presentation. 2. Limited biometry correlates well with clinical dating. 3. Normal appearance of the placenta.  Normal amniotic fluid volume. This exam is performed on an emergent  basis and does not comprehensively evaluate fetal size, dating, or anatomy; follow-up complete OB US should be considered if further fetal assessment is warranted. Electronically Signed   By: Nolon Nations M.D.   On: 01/16/2018 18:53    ____________________________________________   PROCEDURES Procedures  ____________________________________________    CLINICAL IMPRESSION / ASSESSMENT AND PLAN / ED COURSE  Pertinent labs & imaging results that were available during my care of the patient were reviewed by me and considered in my medical decision making (see chart for details).   Patient well-appearing no acute distress, presents with left lower quadrant abdominal pain and second trimester pregnancy. Ultrasound performed to assess the pregnancy which is unremarkable.Considering the patient's symptoms, medical history, and physical examination today, I have low suspicion for cholecystitis or biliary pathology, pancreatitis, perforation or bowel obstruction, hernia, intra-abdominal abscess, AAA or dissection, volvulus or intussusception, mesenteric ischemia, or appendicitis.  Doubt STI PID TOA or torsion. Review of electronic medical record does show she is positive for bacterial vaginosis  on a previous culture. She still having symptoms that are compatible with this and I'll treat her with Flagyl. I did discuss with her the risk of preterm labor which she acknowledges.  Clinical Course as of Jan 16 2006  Mon Jan 16, 2018  1859 Ultrasound unremarkable. Live IUP.  [PS]    Clinical Course User Index [PS] Carrie Mew, MD     ____________________________________________   FINAL CLINICAL IMPRESSION(S) / ED DIAGNOSES    Final diagnoses:  Bacterial vaginosis  Second trimester pregnancy  Left lower quadrant pain       Portions of this note were generated with dragon dictation software. Dictation errors may occur despite best attempts at proofreading.    Carrie Mew, MD 01/16/18 2010

## 2018-01-18 LAB — URINE CULTURE

## 2018-02-01 ENCOUNTER — Encounter: Payer: Self-pay | Admitting: Medical

## 2018-02-01 ENCOUNTER — Ambulatory Visit (INDEPENDENT_AMBULATORY_CARE_PROVIDER_SITE_OTHER): Payer: Medicaid Other | Admitting: Medical

## 2018-02-01 VITALS — BP 125/72 | HR 67 | Wt 175.7 lb

## 2018-02-01 DIAGNOSIS — Z3401 Encounter for supervision of normal first pregnancy, first trimester: Secondary | ICD-10-CM

## 2018-02-01 NOTE — Patient Instructions (Addendum)
Second Trimester of Pregnancy The second trimester is from week 13 through week 28, month 4 through 6. This is often the time in pregnancy that you feel your best. Often times, morning sickness has lessened or quit. You may have more energy, and you may get hungry more often. Your unborn baby (fetus) is growing rapidly. At the end of the sixth month, he or she is about 9 inches long and weighs about 1 pounds. You will likely feel the baby move (quickening) between 18 and 20 weeks of pregnancy. Follow these instructions at home:  Avoid all smoking, herbs, and alcohol. Avoid drugs not approved by your doctor.  Do not use any tobacco products, including cigarettes, chewing tobacco, and electronic cigarettes. If you need help quitting, ask your doctor. You may get counseling or other support to help you quit.  Only take medicine as told by your doctor. Some medicines are safe and some are not during pregnancy.  Exercise only as told by your doctor. Stop exercising if you start having cramps.  Eat regular, healthy meals.  Wear a good support bra if your breasts are tender.  Do not use hot tubs, steam rooms, or saunas.  Wear your seat belt when driving.  Avoid raw meat, uncooked cheese, and liter boxes and soil used by cats.  Take your prenatal vitamins.  Take 1500-2000 milligrams of calcium daily starting at the 20th week of pregnancy until you deliver your baby.  Try taking medicine that helps you poop (stool softener) as needed, and if your doctor approves. Eat more fiber by eating fresh fruit, vegetables, and whole grains. Drink enough fluids to keep your pee (urine) clear or pale yellow.  Take warm water baths (sitz baths) to soothe pain or discomfort caused by hemorrhoids. Use hemorrhoid cream if your doctor approves.  If you have puffy, bulging veins (varicose veins), wear support hose. Raise (elevate) your feet for 15 minutes, 3-4 times a day. Limit salt in your diet.  Avoid heavy  lifting, wear low heals, and sit up straight.  Rest with your legs raised if you have leg cramps or low back pain.  Visit your dentist if you have not gone during your pregnancy. Use a soft toothbrush to brush your teeth. Be gentle when you floss.  You can have sex (intercourse) unless your doctor tells you not to.  Go to your doctor visits. Get help if:  You feel dizzy.  You have mild cramps or pressure in your lower belly (abdomen).  You have a nagging pain in your belly area.  You continue to feel sick to your stomach (nauseous), throw up (vomit), or have watery poop (diarrhea).  You have bad smelling fluid coming from your vagina.  You have pain with peeing (urination). Get help right away if:  You have a fever.  You are leaking fluid from your vagina.  You have spotting or bleeding from your vagina.  You have severe belly cramping or pain.  You lose or gain weight rapidly.  You have trouble catching your breath and have chest pain.  You notice sudden or extreme puffiness (swelling) of your face, hands, ankles, feet, or legs.  You have not felt the baby move in over an hour.  You have severe headaches that do not go away with medicine.  You have vision changes. This information is not intended to replace advice given to you by your health care provider. Make sure you discuss any questions you have with your health care  provider. Document Released: 03/02/2010 Document Revised: 05/13/2016 Document Reviewed: 02/06/2013 Elsevier Interactive Patient Education  2017 [place] [address] [place], [place]  [address] Phone - [phone]   Fax - [phone]  [facility] [address] [place], [place] [address] Phone - [phone]   Fax - [place] [address] [place], [place]  [address] Phone - [phone]   Fax -  [phone]  [place] [address] [place] [address] [place], [place]  [address] Phone - [phone]   Fax - [phone]  [place] [address] [place], [place]  [address] Phone - [phone]   Fax - [phone]  [facility] [address] [place], [place]  [address] Phone - [phone]   Fax - [place] [address], [place], [place]  [address] Phone - [phone]   Fax - [phone]  [place] [address] [place], [place] [address] [place], [place]  [address] Phone - [phone]   Fax - [place] [address] [place], [place]  [address] Phone - [phone]   Fax - [phone] [facility] [place]. [address] [place], [place]  [address] Phone - [phone]   Fax - [phone]  [place] [address] [place], [place]  [address] Phone - [phone]   Fax - [phone]  [facility] [place], [place], [place]  [address] Phone - [phone]   Fax - [place] [address] [place], [place]  [address] Phone - [phone]   Fax - [phone]  [facility] [address], [place], [place]  [address] Phone - [place], [place]  [address] Phone - [phone]   Fax - [place] [address] [place], [place]  [address] Phone - [phone]   Fax - [phone]  [place] [address] [place], [place]  [address] Phone - [phone]   Fax - [place]. [place], [place]  [address] Phone - [phone]   Fax - [place], [place], [place]   [address] Phone - [phone]   Fax - [place] [address] [place], [place]  [address] Phone - [phone]   Fax - [phone]  [place] [address] [place], [place]  [address] Phone - [phone]   Fax - [place] [address], [place], [place]  [address] Phone - [phone]   Fax - [phone]  [place] [address] [place], [place]  [address] Phone - [phone]   Fax - [phone] [place] [address] [place], [place]  [address] Phone - [phone]   Fax - [place] [address] [place],   [address] Phone - [phone]   Fax - [place] [address], [place],   [address] Phone - [phone]   Fax - [phone]  [place] [address] [place] [address] Phone  [phone]  Fax [phone]  Childbirth Education Options: [facility] Department Classes:  Childbirth education classes can help you get ready for a positive parenting experience. You can also meet other expectant parents and get free stuff for your baby. Each class runs for five weeks on the same night and costs $45 for the mother-to-be and her support person. Medicaid covers the cost if you are eligible. Call [phone] to register. [facility] Childbirth Education:  [phone] or [phone] or [email or web address]  Baby & Me Class: Discuss newborn & infant parenting and family adjustment issues with other new mothers in a relaxed environment. Each week brings a new speaker or baby-centered activity. We encourage new mothers to join [place] every Thursday at 11:00am. Babies birth until crawling. No registration or fee. Daddy [employer]: This course offers Dads-to-be the tools and  knowledge needed to feel confident on their journey to becoming new fathers. Experienced dads, who have been trained as coaches, teach dads-to-be how to hold, comfort, diaper, swaddle and play with their infant while being able to support the new mom as well. A class for men taught by men. $25/dad Big Brother/Big Sister: Let your children share in the joy of a new brother or sister in this special class designed just for them. Class includes discussion about how families care for babies: swaddling, holding, diapering, safety as well as how they can be helpful in their new role. This class is designed for children ages 16 to 71, but any age is welcome. Please register each child individually. $5/child  Mom Talk: This mom-led group offers support and connection to mothers as they journey through the adjustments and struggles of that sometimes overwhelming first year after the birth of a child. Tuesdays at 10:00am and Thursdays at 6:00pm. Babies welcome. No registration or fee. Breastfeeding Support Group: This group is a mother-to-mother support circle where moms have the opportunity to share their breastfeeding experiences. A Lactation Consultant is present for questions and concerns. Meets each Tuesday at 11:00am. No fee or registration. Breastfeeding Your Baby: Learn what to expect in the first days of breastfeeding your newborn.  This class will help you feel more confident with the skills needed to begin your breastfeeding experience. Many new mothers are concerned about breastfeeding after leaving the hospital. This class will also address the most common fears and challenges about breastfeeding during the first few weeks, months and beyond. (call for fee) Comfort Techniques and Tour: This 2 hour interactive class will provide you the opportunity to learn & practice hands-on techniques that can help relieve some of the discomfort of labor and encourage your baby to rotate toward the best position for birth.  You and your partner will be able to try a variety of labor positions with birth balls and rebozos as well as practice breathing, relaxation, and visualization techniques. A tour of the [facility] is included with this class. $20 per registrant and support person Childbirth Class- Weekend Option: This class is a Weekend version of our Birth & Baby series. It is designed for parents who have a difficult time fitting several weeks of classes into their schedule. It covers the care of your newborn and the basics of labor and childbirth. It also includes a [place] [facility] and lunch. The class is held two consecutive days: beginning on Friday evening from 6:30 - 8:30 p.m. and the next day, Saturday from 9 a.m. - 4 p.m. (call for fee) Doren Custard Class:  Interested in a waterbirth?  This informational class will help you discover whether waterbirth is the right fit for you. Education about waterbirth itself, supplies you would need and how to assemble your support team is what you can expect from this class. Some obstetrical practices require this class in order to pursue a waterbirth. (Not all obstetrical practices offer waterbirth-check with your healthcare provider.) Register only the expectant mom, but you are encouraged to bring your partner to class! Required if planning waterbirth, no fee. Infant/Child CPR: Parents, grandparents, babysitters, and friends learn Cardio-Pulmonary Resuscitation skills for infants and children. You will also learn how to treat both conscious and unconscious choking in infants and children. This Family & Friends program does not offer certification. Register each participant individually to ensure that enough mannequins are available. (Call for fee) Grandparent Love: Expecting a grandbaby? This class is for you! Learn about the latest infant care and safety recommendations and ways to support your own child as he or she  transitions into the parenting role. Taught by Registered Nurses who are childbirth instructors, but most importantly...they are grandmothers too! $10/person. Childbirth Class- Natural Childbirth: This series of 5 weekly classes is for expectant parents who want to learn and practice natural methods of coping with the process of labor and childbirth. Relaxation, breathing, massage, visualization, role of the partner, and helpful positioning are highlighted. Participants learn how to be confident in their body's ability to give birth. This class will empower and help parents make informed decisions about their own care. Includes discussion that will help new parents transition into the immediate postpartum period. [facility] is included. We suggest taking this class between 25-32 weeks, but it's only a recommendation. $75 per registrant and one support person or $30 Medicaid. Childbirth Class- 3 week Series: This option of 3 weekly classes helps you and your labor partner prepare for childbirth. Newborn care, labor & birth, cesarean birth, pain management, and comfort techniques are discussed and a [place] [facility] is included. The class meets at the same time, on the same day of the week for 3 consecutive weeks beginning with the starting date you choose. $60 for registrant and one support person.  Marvelous Multiples: Expecting twins, triplets, or more? This class covers the differences in labor, birth, parenting, and breastfeeding issues that face multiples' parents. NICU tour is included. Led by a Certified Childbirth Educator who is the mother of twins. No fee. Caring for Baby: This class is for expectant and adoptive parents who want to learn and practice the most up-to-date newborn care for their babies. Focus is on birth through the first six weeks of life. Topics include feeding, bathing, diapering, crying, umbilical cord care,  circumcision care and safe sleep. Parents learn to recognize symptoms of illness and when to call the pediatrician. Register only the mom-to-be and your partner or support person can plan to come with you! $10 per registrant and support person Childbirth Class- online option: This online class offers you the freedom to complete a Birth and Baby series in the comfort of your own home. The flexibility of this option allows you to review sections at your own pace, at times convenient to you and your support people. It includes additional video information, animations, quizzes, and extended activities. Get organized with helpful eClass tools, checklists, and trackers. Once you register online for the class, you will receive an email within a few days to accept the invitation and  begin the class when the time is right for you. The content will be available to you for 60 days. $60 for 60 days of online access for you and your support people.  Local Doulas: Natural Baby Doulas [email or web address] Tel: [phone] [email or web address] [employer] [phone] [email or web address] The Labor Hassell Halim  (also do waterbirth tub rental) [phone] [email or web address] Triad Birth Doula [phone] [email or web address].fi [place] [email or web address]://[email or web address]-[email or web address]/ [employer] (PADA) [email or web address] https://www.[email or web address]/ [place] Birth and Baby  http://[email or web address]/ Considering Waterbirth? Guide for patients at [facility] [employer]  Why consider waterbirth?  . Gentle birth for babies . Less pain medicine used in labor . May allow for passive descent/less pushing . May reduce perineal tears  . More mobility and instinctive maternal position changes . Increased maternal relaxation . Reduced blood  pressure in labor  Is waterbirth safe? What are the risks of infection, drowning or other complications?  . Infection: o Very low risk (3.7 % for tub vs 4.8% for bed) o 7 in 8000 waterbirths with documented infection o Poorly cleaned equipment most common cause o Slightly lower group B strep transmission rate  . Drowning o Maternal:  - Very low risk   - Related to seizures or fainting o Newborn:  - Very low risk. No evidence of increased risk of respiratory problems in multiple large studies - Physiological protection from breathing under water - Avoid underwater birth if there are any fetal complications - Once baby's head is out of the water, keep it out.  . Birth complication o Some reports of cord trauma, but risk decreased by bringing baby to surface gradually o No evidence of increased risk of shoulder dystocia. Mothers can usually change positions faster in water than in a bed, possibly aiding the maneuvers to free the shoulder.   You must attend a Doren Custard class at [facility]  3rd Wednesday of every month from 7-9pm  [employer] by calling [phone] or online at [email or web address]  Bring [place] the certificate from the class to your prenatal appointment  Meet with a midwife at 36 weeks to see if you can still plan a waterbirth and to sign the consent.   Purchase or rent the following supplies:   Water Birth Pool (Birth Pool in a Box or [place] for instance)  (Tubs start ~$125)  Single-use disposable tub liner designed for your brand of tub  New garden hose labeled "lead-free", "suitable for drinking water",  Electric drain pump to remove water (We recommend 792 gallon per hour or greater pump.)   Separate garden hose to remove the dirty water  Fish net  Bathing suit top (optional)  Long-handled mirror (optional)  Places to purchase or rent supplies  GotWebTools.is for tub purchases and supplies  Waterbirthsolutions.com for tub  purchases and supplies  The Labor Ladies (www.thelaborladies.com) $275 for tub rental/set-up & take down/kit   [employer] (http://www.[place]ming.com/.htm) Information regarding doulas (labor support) who provide pool rentals  Our practice has a Birth Pool in a Box tub at the hospital that you may borrow on a first-come-first-served basis. It is your responsibility to to set up, clean and break down the tub. We cannot guarantee the availability of this tub in advance. You are responsible for bringing all accessories listed above. If you do not have all necessary supplies you cannot have a waterbirth.    Things that would prevent you from having a waterbirth:  Premature, <37wks  Previous cesarean birth  Presence of thick  meconium-stained fluid  Multiple gestation (Twins, triplets, etc.)  Uncontrolled diabetes or gestational diabetes requiring medication  Hypertension requiring medication or diagnosis of pre-eclampsia  Heavy vaginal bleeding  Non-reassuring fetal heart rate  Active infection (MRSA, etc.). Group B Strep is NOT a contraindication for  waterbirth.  If your labor has to be induced and induction method requires continuous  monitoring of the baby's heart rate  Other risks/issues identified by your obstetrical provider  Please remember that birth is unpredictable. Under certain unforeseeable circumstances your provider may advise against giving birth in the tub. These decisions will be made on a case-by-case basis and with the safety of you and your baby as our highest priority.

## 2018-02-03 NOTE — Progress Notes (Signed)
   PRENATAL VISIT NOTE  Subjective:  Christie Ali is a 32 y.o. G1P0000 at [redacted]w[redacted]d being seen today for ongoing prenatal care.  She is currently monitored for the following issues for this low-risk pregnancy and has Supervision of low-risk first pregnancy; Tobacco use during pregnancy; Asthma during pregnancy; and Drug abuse during pregnancy [facility]) on their problem list.  Patient reports no complaints.  Contractions: Not present. Vag. Bleeding: None.  Movement: Present. Denies leaking of fluid.   The following portions of the patient's history were reviewed and updated as appropriate: allergies, current medications, past family history, past medical history, past social history, past surgical history and problem list. Problem list updated.  Objective:   Vitals:   02/01/18 1143  BP: 125/72  Pulse: 67  Weight: 175 lb 11.2 oz (79.7 kg)    Fetal Status: Fetal Heart Rate (bpm): 149   Movement: Present     General:  Alert, oriented and cooperative. Patient is in no acute distress.  Skin: Skin is warm and dry. No rash noted.   Cardiovascular: Normal heart rate noted  Respiratory: Normal respiratory effort, no problems with respiration noted  Abdomen: Soft, gravid, appropriate for gestational age.  Pain/Pressure: Absent     Pelvic: Cervical exam deferred        Extremities: Normal range of motion.  Edema: None  Mental Status:  Normal mood and affect. Normal behavior. Normal judgment and thought content.   Assessment and Plan:  Pregnancy: G1P0000 at [redacted]w[redacted]d  1. Encounter for supervision of low-risk first pregnancy in first trimester - [place] MFM OB COMP + 32 WK; scheduled  Preterm labor/second trimester symptoms and general obstetric precautions including but not limited to vaginal bleeding, contractions, leaking of fluid and fetal movement were reviewed in detail with the patient. Please refer to After Visit Summary for other counseling recommendations.  Return in about 4 weeks (around  03/01/2018) for LOB.   Kerry Hough, PA-C

## 2018-02-06 ENCOUNTER — Encounter: Payer: Self-pay | Admitting: Medical

## 2018-02-07 ENCOUNTER — Ambulatory Visit (HOSPITAL_COMMUNITY)
Admission: RE | Admit: 2018-02-07 | Discharge: 2018-02-07 | Disposition: A | Discharge status: Home / Self Care | Payer: Medicaid Other | Source: Ambulatory Visit | Attending: Medical | Admitting: Medical

## 2018-02-07 ENCOUNTER — Other Ambulatory Visit: Payer: Self-pay | Admitting: Medical

## 2018-02-07 DIAGNOSIS — Z3A20 20 weeks gestation of pregnancy: Secondary | ICD-10-CM | POA: Diagnosis not present

## 2018-02-07 DIAGNOSIS — O288 Other abnormal findings on antenatal screening of mother: Secondary | ICD-10-CM | POA: Insufficient documentation

## 2018-02-07 DIAGNOSIS — Z3689 Encounter for other specified antenatal screening: Secondary | ICD-10-CM | POA: Diagnosis present

## 2018-02-07 DIAGNOSIS — Z3401 Encounter for supervision of normal first pregnancy, first trimester: Secondary | ICD-10-CM

## 2018-02-28 ENCOUNTER — Ambulatory Visit (INDEPENDENT_AMBULATORY_CARE_PROVIDER_SITE_OTHER): Payer: Medicaid Other | Admitting: Obstetrics and Gynecology

## 2018-02-28 VITALS — BP 123/76 | HR 75 | Wt 183.4 lb

## 2018-02-28 DIAGNOSIS — O99332 Smoking (tobacco) complicating pregnancy, second trimester: Secondary | ICD-10-CM

## 2018-02-28 DIAGNOSIS — IMO0002 Reserved for concepts with insufficient information to code with codable children: Secondary | ICD-10-CM

## 2018-02-28 DIAGNOSIS — Z3402 Encounter for supervision of normal first pregnancy, second trimester: Secondary | ICD-10-CM

## 2018-02-28 DIAGNOSIS — Z0489 Encounter for examination and observation for other specified reasons: Secondary | ICD-10-CM

## 2018-02-28 NOTE — Patient Instructions (Signed)
Second Trimester of Pregnancy The second trimester is from week 13 through week 28, month 4 through 6. This is often the time in pregnancy that you feel your best. Often times, morning sickness has lessened or quit. You may have more energy, and you may get hungry more often. Your unborn baby (fetus) is growing rapidly. At the end of the sixth month, he or she is about 9 inches long and weighs about 1 pounds. You will likely feel the baby move (quickening) between 18 and 20 weeks of pregnancy. Follow these instructions at home:  Avoid all smoking, herbs, and alcohol. Avoid drugs not approved by your doctor.  Do not use any tobacco products, including cigarettes, chewing tobacco, and electronic cigarettes. If you need help quitting, ask your doctor. You may get counseling or other support to help you quit.  Only take medicine as told by your doctor. Some medicines are safe and some are not during pregnancy.  Exercise only as told by your doctor. Stop exercising if you start having cramps.  Eat regular, healthy meals.  Wear a good support bra if your breasts are tender.  Do not use hot tubs, steam rooms, or saunas.  Wear your seat belt when driving.  Avoid raw meat, uncooked cheese, and liter boxes and soil used by cats.  Take your prenatal vitamins.  Take 1500-2000 milligrams of calcium daily starting at the 20th week of pregnancy until you deliver your baby.  Try taking medicine that helps you poop (stool softener) as needed, and if your doctor approves. Eat more fiber by eating fresh fruit, vegetables, and whole grains. Drink enough fluids to keep your pee (urine) clear or pale yellow.  Take warm water baths (sitz baths) to soothe pain or discomfort caused by hemorrhoids. Use hemorrhoid cream if your doctor approves.  If you have puffy, bulging veins (varicose veins), wear support hose. Raise (elevate) your feet for 15 minutes, 3-4 times a day. Limit salt in your diet.  Avoid heavy  lifting, wear low heals, and sit up straight.  Rest with your legs raised if you have leg cramps or low back pain.  Visit your dentist if you have not gone during your pregnancy. Use a soft toothbrush to brush your teeth. Be gentle when you floss.  You can have sex (intercourse) unless your doctor tells you not to.  Go to your doctor visits. Get help if:  You feel dizzy.  You have mild cramps or pressure in your lower belly (abdomen).  You have a nagging pain in your belly area.  You continue to feel sick to your stomach (nauseous), throw up (vomit), or have watery poop (diarrhea).  You have bad smelling fluid coming from your vagina.  You have pain with peeing (urination). Get help right away if:  You have a fever.  You are leaking fluid from your vagina.  You have spotting or bleeding from your vagina.  You have severe belly cramping or pain.  You lose or gain weight rapidly.  You have trouble catching your breath and have chest pain.  You notice sudden or extreme puffiness (swelling) of your face, hands, ankles, feet, or legs.  You have not felt the baby move in over an hour.  You have severe headaches that do not go away with medicine.  You have vision changes. This information is not intended to replace advice given to you by your health care provider. Make sure you discuss any questions you have with your health care   provider. Document Released: 03/02/2010 Document Revised: 05/13/2016 Document Reviewed: 02/06/2013 Elsevier Interactive Patient Education  2017 [employer]  

## 2018-02-28 NOTE — Progress Notes (Signed)
Subjective:  Christie Ali is a 32 y.o. G1P0000 at [redacted]w[redacted]d being seen today for ongoing prenatal care.  She is currently monitored for the following issues for this low-risk pregnancy and has Supervision of low-risk first pregnancy; Tobacco use during pregnancy; Asthma during pregnancy; and Drug abuse during pregnancy [facility]) on their problem list.  Patient reports no complaints.  Contractions: Not present. Vag. Bleeding: None.  Movement: Present. Denies leaking of fluid.   The following portions of the patient's history were reviewed and updated as appropriate: allergies, current medications, past family history, past medical history, past social history, past surgical history and problem list. Problem list updated.  Objective:   Vitals:   02/28/18 1013  BP: 123/76  Pulse: 75  Weight: 83.2 kg (183 lb 6.4 oz)    Fetal Status: Fetal Heart Rate (bpm): 150 Fundal Height: 23 cm Movement: Present     General:  Alert, oriented and cooperative. Patient is in no acute distress.  Skin: Skin is warm and dry. No rash noted.   Cardiovascular: Normal heart rate noted  Respiratory: Normal respiratory effort, no problems with respiration noted  Abdomen: Soft, gravid, appropriate for gestational age. Pain/Pressure: Absent     Pelvic: Vag. Bleeding: None     Cervical exam deferred        Extremities: Normal range of motion.  Edema: None  Mental Status: Normal mood and affect. Normal behavior. Normal judgment and thought content.   Urinalysis:      Assessment and Plan:  Pregnancy: G1P0000 at [redacted]w[redacted]d  1. Encounter for supervision of low-risk first pregnancy in second trimester Doing well. Continue routine care. Patient to have 28wk labs at next visit.   2. Evaluate anatomy not seen on prior sonogram - [place] MFM OB FOLLOW UP; Future  3. Maternal tobacco use in second trimester Continues to smoke tobacco; 5-6 cigs a day. Encouraged cessation.   Preterm labor symptoms and general obstetric precautions  including but not limited to vaginal bleeding, contractions, leaking of fluid and fetal movement were reviewed in detail with the patient. Please refer to After Visit Summary for other counseling recommendations.  Return in about 4 weeks (around 03/28/2018) for ob visit.   Katheren Shams, DO

## 2018-03-08 ENCOUNTER — Ambulatory Visit (HOSPITAL_COMMUNITY)
Admission: RE | Admit: 2018-03-08 | Discharge: 2018-03-08 | Disposition: A | Discharge status: Home / Self Care | Payer: Medicaid Other | Source: Ambulatory Visit | Attending: Obstetrics and Gynecology | Admitting: Obstetrics and Gynecology

## 2018-03-08 ENCOUNTER — Other Ambulatory Visit (HOSPITAL_COMMUNITY): Payer: Self-pay | Admitting: *Deleted

## 2018-03-08 DIAGNOSIS — Z3A24 24 weeks gestation of pregnancy: Secondary | ICD-10-CM | POA: Insufficient documentation

## 2018-03-08 DIAGNOSIS — Z362 Encounter for other antenatal screening follow-up: Secondary | ICD-10-CM | POA: Diagnosis not present

## 2018-03-08 DIAGNOSIS — Z0489 Encounter for examination and observation for other specified reasons: Secondary | ICD-10-CM | POA: Insufficient documentation

## 2018-03-08 DIAGNOSIS — IMO0002 Reserved for concepts with insufficient information to code with codable children: Secondary | ICD-10-CM

## 2018-03-08 DIAGNOSIS — O358XX0 Maternal care for other (suspected) fetal abnormality and damage, not applicable or unspecified: Secondary | ICD-10-CM

## 2018-03-08 DIAGNOSIS — IMO0001 Reserved for inherently not codable concepts without codable children: Secondary | ICD-10-CM

## 2018-03-08 DIAGNOSIS — O288 Other abnormal findings on antenatal screening of mother: Secondary | ICD-10-CM | POA: Insufficient documentation

## 2018-03-09 ENCOUNTER — Encounter: Payer: Self-pay | Admitting: Obstetrics and Gynecology

## 2018-03-09 DIAGNOSIS — O359XX0 Maternal care for (suspected) fetal abnormality and damage, unspecified, not applicable or unspecified: Secondary | ICD-10-CM | POA: Insufficient documentation

## 2018-03-28 ENCOUNTER — Ambulatory Visit (INDEPENDENT_AMBULATORY_CARE_PROVIDER_SITE_OTHER): Payer: Medicaid Other | Admitting: Certified Nurse Midwife

## 2018-03-28 ENCOUNTER — Other Ambulatory Visit: Payer: Medicaid Other

## 2018-03-28 ENCOUNTER — Encounter: Payer: Self-pay | Admitting: Certified Nurse Midwife

## 2018-03-28 VITALS — BP 124/67 | HR 81 | Wt 188.1 lb

## 2018-03-28 DIAGNOSIS — Z23 Encounter for immunization: Secondary | ICD-10-CM

## 2018-03-28 DIAGNOSIS — Z3402 Encounter for supervision of normal first pregnancy, second trimester: Secondary | ICD-10-CM | POA: Diagnosis present

## 2018-03-28 DIAGNOSIS — O99332 Smoking (tobacco) complicating pregnancy, second trimester: Secondary | ICD-10-CM | POA: Diagnosis not present

## 2018-03-28 NOTE — Patient Instructions (Signed)

## 2018-03-28 NOTE — Progress Notes (Signed)
   PRENATAL VISIT NOTE  Subjective:  Christie Ali is a 32 y.o. G1P0000 at [redacted]w[redacted]d being seen today for ongoing prenatal care.  She is currently monitored for the following issues for this low-risk pregnancy and has Supervision of low-risk first pregnancy; Tobacco use during pregnancy; Asthma during pregnancy; Drug abuse during pregnancy [facility]); and Known fetal anomaly, antepartum on their problem list.  Patient reports no complaints.  Contractions: Not present. Vag. Bleeding: None.  Movement: Present. Denies leaking of fluid.   The following portions of the patient's history were reviewed and updated as appropriate: allergies, current medications, past family history, past medical history, past social history, past surgical history and problem list. Problem list updated.  Objective:   Vitals:   03/28/18 0816  BP: 124/67  Pulse: 81  Weight: 188 lb 1.6 oz (85.3 kg)    Fetal Status: Fetal Heart Rate (bpm): 140 Fundal Height: 28 cm Movement: Present     General:  Alert, oriented and cooperative. Patient is in no acute distress.  Skin: Skin is warm and dry. No rash noted.   Cardiovascular: Normal heart rate noted  Respiratory: Normal respiratory effort, no problems with respiration noted  Abdomen: Soft, gravid, appropriate for gestational age.  Pain/Pressure: Absent     Pelvic: Cervical exam deferred        Extremities: Normal range of motion.  Edema: Trace  Mental Status: Normal mood and affect. Normal behavior. Normal judgment and thought content.   Assessment and Plan:  Pregnancy: G1P0000 at [redacted]w[redacted]d  1. Encounter for supervision of low-risk first pregnancy in second trimester -Patient doing well no complaints today  -Third trimester labs obtained and 2hr GTT  -Tdap vaccine greater than or equal to 7yo IM -Follow up US scheduled for 04/05/2018  2. Maternal tobacco use in second trimester -Patient reports continued smoking during pregnancy, reports that she is trying to decrease the  amount of cigarettes per day -She reports smoking 5-6 cigarettes per day  -Educated and discussed in detail smoking cessation   Preterm labor symptoms and general obstetric precautions including but not limited to vaginal bleeding, contractions, leaking of fluid and fetal movement were reviewed in detail with the patient. Please refer to After Visit Summary for other counseling recommendations.  Return in about 2 weeks (around 04/11/2018) for ROB.  Future Appointments  Date Time Provider [place]  04/05/2018 10:45 AM [facility] [place], CNM

## 2018-03-29 LAB — CBC
HEMOGLOBIN: 12.8 g/dL (ref 11.1–15.9)
Hematocrit: 37.8 % (ref 34.0–46.6)
MCH: 32.9 pg (ref 26.6–33.0)
MCHC: 33.9 g/dL (ref 31.5–35.7)
MCV: 97 fL (ref 79–97)
Platelets: 256 10*3/uL (ref 150–379)
RBC: 3.89 x10E6/uL (ref 3.77–5.28)
RDW: 13.1 % (ref 12.3–15.4)
WBC: 16.5 10*3/uL — AB (ref 3.4–10.8)

## 2018-03-29 LAB — HIV ANTIBODY (ROUTINE TESTING W REFLEX): HIV Screen 4th Generation wRfx: NONREACTIVE

## 2018-03-29 LAB — GLUCOSE TOLERANCE, 2 HOURS W/ 1HR
GLUCOSE, 1 HOUR: 153 mg/dL (ref 65–179)
GLUCOSE, 2 HOUR: 106 mg/dL (ref 65–152)
Glucose, Fasting: 84 mg/dL (ref 65–91)

## 2018-03-29 LAB — RPR: RPR Ser Ql: NONREACTIVE

## 2018-04-05 ENCOUNTER — Other Ambulatory Visit (HOSPITAL_COMMUNITY): Payer: Self-pay | Admitting: Obstetrics and Gynecology

## 2018-04-05 ENCOUNTER — Ambulatory Visit (HOSPITAL_COMMUNITY)
Admission: RE | Admit: 2018-04-05 | Discharge: 2018-04-05 | Disposition: A | Discharge status: Home / Self Care | Payer: Medicaid Other | Source: Ambulatory Visit | Attending: Obstetrics and Gynecology | Admitting: Obstetrics and Gynecology

## 2018-04-05 DIAGNOSIS — Z3A28 28 weeks gestation of pregnancy: Secondary | ICD-10-CM | POA: Diagnosis not present

## 2018-04-05 DIAGNOSIS — IMO0001 Reserved for inherently not codable concepts without codable children: Secondary | ICD-10-CM

## 2018-04-05 DIAGNOSIS — O99333 Smoking (tobacco) complicating pregnancy, third trimester: Secondary | ICD-10-CM

## 2018-04-05 DIAGNOSIS — O99323 Drug use complicating pregnancy, third trimester: Secondary | ICD-10-CM

## 2018-04-05 DIAGNOSIS — O358XX0 Maternal care for other (suspected) fetal abnormality and damage, not applicable or unspecified: Secondary | ICD-10-CM | POA: Insufficient documentation

## 2018-04-13 ENCOUNTER — Ambulatory Visit (INDEPENDENT_AMBULATORY_CARE_PROVIDER_SITE_OTHER): Payer: Medicaid Other | Admitting: Advanced Practice Midwife

## 2018-04-13 ENCOUNTER — Other Ambulatory Visit (HOSPITAL_COMMUNITY)
Admission: RE | Admit: 2018-04-13 | Discharge: 2018-04-13 | Disposition: A | Discharge status: Home / Self Care | Payer: Medicaid Other | Source: Ambulatory Visit | Attending: Obstetrics & Gynecology | Admitting: Obstetrics & Gynecology

## 2018-04-13 VITALS — BP 123/71 | HR 84 | Wt 193.9 lb

## 2018-04-13 DIAGNOSIS — O99320 Drug use complicating pregnancy, unspecified trimester: Secondary | ICD-10-CM

## 2018-04-13 DIAGNOSIS — O99323 Drug use complicating pregnancy, third trimester: Secondary | ICD-10-CM

## 2018-04-13 DIAGNOSIS — O359XX0 Maternal care for (suspected) fetal abnormality and damage, unspecified, not applicable or unspecified: Secondary | ICD-10-CM

## 2018-04-13 DIAGNOSIS — F1910 Other psychoactive substance abuse, uncomplicated: Secondary | ICD-10-CM

## 2018-04-13 DIAGNOSIS — Z3400 Encounter for supervision of normal first pregnancy, unspecified trimester: Secondary | ICD-10-CM

## 2018-04-13 DIAGNOSIS — O26899 Other specified pregnancy related conditions, unspecified trimester: Secondary | ICD-10-CM

## 2018-04-13 DIAGNOSIS — O99519 Diseases of the respiratory system complicating pregnancy, unspecified trimester: Secondary | ICD-10-CM

## 2018-04-13 DIAGNOSIS — O353XX0 Maternal care for (suspected) damage to fetus from viral disease in mother, not applicable or unspecified: Secondary | ICD-10-CM

## 2018-04-13 DIAGNOSIS — O26893 Other specified pregnancy related conditions, third trimester: Secondary | ICD-10-CM

## 2018-04-13 DIAGNOSIS — O99513 Diseases of the respiratory system complicating pregnancy, third trimester: Secondary | ICD-10-CM

## 2018-04-13 DIAGNOSIS — J45909 Unspecified asthma, uncomplicated: Secondary | ICD-10-CM

## 2018-04-13 DIAGNOSIS — N898 Other specified noninflammatory disorders of vagina: Secondary | ICD-10-CM

## 2018-04-13 NOTE — Progress Notes (Signed)
   PRENATAL VISIT NOTE  Subjective:  Christie Ali is a 32 y.o. G1P0000 at [redacted]w[redacted]d being seen today for ongoing prenatal care.  She is currently monitored for the following issues for this low-risk pregnancy and has Supervision of low-risk first pregnancy; Tobacco use during pregnancy; Asthma during pregnancy; Drug abuse during pregnancy ([place]); and Known fetal anomaly, antepartum on their problem list.  Patient reports concerns about positive marijuana testing and her recent US with abnormalities.  Contractions: Irregular. Vag. Bleeding: None.  Movement: Present. Denies leaking of fluid.   The following portions of the patient's history were reviewed and updated as appropriate: allergies, current medications, past family history, past medical history, past social history, past surgical history and problem list. Problem list updated.  Objective:   Vitals:   04/13/18 1054  BP: 123/71  Pulse: 84  Weight: 193 lb 14.4 oz (88 kg)    Fetal Status: Fetal Heart Rate (bpm): 135   Movement: Present     General:  Alert, oriented and cooperative. Patient is in no acute distress.  Skin: Skin is warm and dry. No rash noted.   Cardiovascular: Normal heart rate noted  Respiratory: Normal respiratory effort, no problems with respiration noted  Abdomen: Soft, gravid, appropriate for gestational age.  Pain/Pressure: Absent     Pelvic: Cervical exam deferred        Extremities: Normal range of motion.  Edema: Trace  Mental Status: Normal mood and affect. Normal behavior. Normal judgment and thought content.   Assessment and Plan:  Pregnancy: G1P0000 at [redacted]w[redacted]d  1. Encounter for supervision of low-risk first pregnancy, antepartum   2. Known fetal anomaly, antepartum, single or unspecified fetus  - Reviewed pt recent US, with isolated polydactyly, all other anatomy wnl  3. Asthma during pregnancy   4. Vaginal discharge during pregnancy, antepartum --No itching/burning, mucus discharge after  intercourse - Cervicovaginal ancillary only  5.  Drug use in pregnancy --Positive for marijuana, pt not using marijuana any more, wants to know about retesting, testing of baby, custody concerns --Plan to retest UDS at next visit, ~6 weeks after pt stopped using --SW likely to see pt after delivery, with good family support, and especially with negative drug screens in third trimester, should be no concerns about custody --Pt reports reassurance, desires UDS at next visit  Preterm labor symptoms and general obstetric precautions including but not limited to vaginal bleeding, contractions, leaking of fluid and fetal movement were reviewed in detail with the patient. Please refer to After Visit Summary for other counseling recommendations.  No follow-ups on file.  Future Appointments  Date Time Provider [place]  04/27/2018 11:15 AM Danielle Rankin [facility] [place]  05/12/2018  9:15 AM Katheren Shams, DO [facility] [place]  05/25/2018  9:55 AM Ephraim Hamburger, Rona Ravens, NP [facility]  06/02/2018  9:55 AM [place], Kathie Dike, CNM [facility] [place]  06/09/2018  9:55 AM Luvenia Redden, PA-C [facility]  06/16/2018  9:55 AM [place], Rona Ravens, NP [facility]    Fatima Blank, CNM

## 2018-04-13 NOTE — Progress Notes (Signed)
Discussed weight gain is above expected and some stratagies.

## 2018-04-14 ENCOUNTER — Telehealth: Payer: Self-pay | Admitting: Family Medicine

## 2018-04-14 NOTE — Telephone Encounter (Signed)
Called patient stating I am returning her phone call. Patient states she saw [facility] yesterday & they discussed nicotine patches but she forgot to ask for a prescription. Patient is requesting a prescription so she doesn't have to wait until her next appt. Told patient I would send a message to Lattie Haw letting her know. Patient verbalized understanding & had no questions

## 2018-04-14 NOTE — Telephone Encounter (Signed)
Patient is frequenting the nicotine patch

## 2018-04-17 ENCOUNTER — Other Ambulatory Visit: Payer: Self-pay | Admitting: Advanced Practice Midwife

## 2018-04-17 ENCOUNTER — Encounter: Payer: Self-pay | Admitting: Advanced Practice Midwife

## 2018-04-17 LAB — CERVICOVAGINAL ANCILLARY ONLY
Bacterial vaginitis: POSITIVE — AB
CHLAMYDIA, DNA PROBE: NEGATIVE
Candida vaginitis: POSITIVE — AB
Neisseria Gonorrhea: NEGATIVE
TRICH (WINDOWPATH): NEGATIVE

## 2018-04-18 ENCOUNTER — Other Ambulatory Visit: Payer: Self-pay | Admitting: Advanced Practice Midwife

## 2018-04-18 MED ORDER — NICOTINE 14 MG/24HR TD PT24
14.0000 mg | MEDICATED_PATCH | Freq: Every day | TRANSDERMAL | 0 refills | Status: DC
Start: 2018-04-18 — End: 2019-03-09

## 2018-04-27 ENCOUNTER — Ambulatory Visit (INDEPENDENT_AMBULATORY_CARE_PROVIDER_SITE_OTHER): Payer: Medicaid Other | Admitting: Medical

## 2018-04-27 ENCOUNTER — Encounter: Payer: Self-pay | Admitting: Medical

## 2018-04-27 VITALS — BP 125/69 | HR 87 | Wt 194.9 lb

## 2018-04-27 DIAGNOSIS — N760 Acute vaginitis: Secondary | ICD-10-CM

## 2018-04-27 DIAGNOSIS — B9689 Other specified bacterial agents as the cause of diseases classified elsewhere: Secondary | ICD-10-CM

## 2018-04-27 DIAGNOSIS — F1910 Other psychoactive substance abuse, uncomplicated: Secondary | ICD-10-CM

## 2018-04-27 DIAGNOSIS — Z3400 Encounter for supervision of normal first pregnancy, unspecified trimester: Secondary | ICD-10-CM

## 2018-04-27 DIAGNOSIS — O359XX0 Maternal care for (suspected) fetal abnormality and damage, unspecified, not applicable or unspecified: Secondary | ICD-10-CM

## 2018-04-27 DIAGNOSIS — O99320 Drug use complicating pregnancy, unspecified trimester: Secondary | ICD-10-CM

## 2018-04-27 MED ORDER — METRONIDAZOLE 0.75 % VA GEL: 1.0000 | Freq: Two times a day (BID) | VAGINAL | 0 refills | Status: DC

## 2018-04-27 MED ORDER — TERCONAZOLE 0.8 % VA CREA: 1.0000 | TOPICAL_CREAM | Freq: Every day | VAGINAL | 0 refills | Status: DC

## 2018-04-27 NOTE — Progress Notes (Signed)
   PRENATAL VISIT NOTE  Subjective:  Christie Ali is a 32 y.o. G1P0000 at [redacted]w[redacted]d being seen today for ongoing prenatal care.  She is currently monitored for the following issues for this high-risk pregnancy and has Supervision of low-risk first pregnancy; Tobacco use during pregnancy; Asthma during pregnancy; Drug abuse during pregnancy [facility]); and Known fetal anomaly, antepartum on their problem list.  Patient reports no complaints.  Contractions: Not present. Vag. Bleeding: None.  Movement: Present. Denies leaking of fluid.   The following portions of the patient's history were reviewed and updated as appropriate: allergies, current medications, past family history, past medical history, past social history, past surgical history and problem list. Problem list updated.  Objective:   Vitals:   04/27/18 1110  BP: 125/69  Pulse: 87  Weight: 194 lb 14.4 oz (88.4 kg)    Fetal Status: Fetal Heart Rate (bpm): 160 Fundal Height: 33 cm Movement: Present     General:  Alert, oriented and cooperative. Patient is in no acute distress.  Skin: Skin is warm and dry. No rash noted.   Cardiovascular: Normal heart rate noted  Respiratory: Normal respiratory effort, no problems with respiration noted  Abdomen: Soft, gravid, appropriate for gestational age.  Pain/Pressure: Absent     Pelvic: Cervical exam deferred        Extremities: Normal range of motion.  Edema: Trace  Mental Status: Normal mood and affect. Normal behavior. Normal judgment and thought content.   Assessment and Plan:  Pregnancy: G1P0000 at 107w0d  1. Encounter for supervision of low-risk first pregnancy, antepartum  2. Drug abuse during pregnancy ([place]) - Drug Screen 13 w/Conf, WB  3. Known fetal anomaly, antepartum, single or unspecified fetus - Follow-up postpartum. Patient encouraged to consult with chosen pediatrician for further information   4. BV (bacterial vaginosis) - metroNIDAZOLE (METROGEL VAGINAL) 0.75 % vaginal  gel; Place 1 Applicatorful vaginally 2 (two) times daily.  Dispense: 70 g; Refill: 0 - terconazole (TERAZOL 3) 0.8 % vaginal cream; Place 1 applicator vaginally at bedtime.  Dispense: 20 g; Refill: 0 - at patient request in case yeast develops after metrogel usage  Preterm labor symptoms and general obstetric precautions including but not limited to vaginal bleeding, contractions, leaking of fluid and fetal movement were reviewed in detail with the patient. Please refer to After Visit Summary for other counseling recommendations.  Return in about 2 weeks (around 05/11/2018) for LOB.  Future Appointments  Date Time Provider [place]  05/12/2018  9:15 AM Katheren Shams, DO [facility] [place]  05/25/2018  9:55 AM Ephraim Hamburger, Rona Ravens, NP [facility]  06/02/2018  9:55 AM Amedeo Gory, Kathie Dike, CNM [facility] [place]  06/09/2018  9:55 AM Luvenia Redden, PA-C [facility]  06/16/2018  9:55 AM Ephraim Hamburger, Rona Ravens, NP [facility]    Kerry Hough, PA-C

## 2018-04-27 NOTE — Patient Instructions (Signed)

## 2018-04-28 LAB — DRUG SCREEN 13 W/CONF, WB

## 2018-04-28 NOTE — Addendum Note (Signed)
Addended by: Michel Harrow on: 04/28/2018 11:05 AM   Modules accepted: Orders

## 2018-05-04 ENCOUNTER — Encounter: Payer: Self-pay | Admitting: Medical

## 2018-05-12 ENCOUNTER — Encounter: Payer: Medicaid Other | Admitting: Obstetrics and Gynecology

## 2018-05-17 LAB — DRUG SCREEN, UR (12+OXYCODONE+CRT)
AMPHETAMINE SCREEN URINE: NEGATIVE ng/mL
BARBITURATE SCREEN URINE: NEGATIVE ng/mL
BENZODIAZEPINE SCREEN, URINE: NEGATIVE ng/mL
CANNABINOIDS UR QL SCN: POSITIVE — AB
COCAINE(METAB.)SCREEN, URINE: NEGATIVE ng/mL
Creatinine(Crt), U: 100.9 mg/dL (ref 20.0–300.0)
Fentanyl, Urine: NEGATIVE pg/mL
MEPERIDINE SCREEN, URINE: NEGATIVE ng/mL
Methadone Screen, Urine: NEGATIVE ng/mL
OPIATE SCREEN URINE: NEGATIVE ng/mL
OXYCODONE+OXYMORPHONE UR QL SCN: NEGATIVE ng/mL
PROPOXYPHENE SCREEN URINE: NEGATIVE ng/mL
Ph of Urine: 7.5 (ref 4.5–8.9)
Phencyclidine Qn, Ur: NEGATIVE ng/mL
SPECIFIC GRAVITY: 1.017
TRAMADOL SCREEN, URINE: NEGATIVE ng/mL

## 2018-05-17 LAB — SPECIMEN STATUS REPORT

## 2018-05-25 ENCOUNTER — Other Ambulatory Visit: Payer: Self-pay | Admitting: Nurse Practitioner

## 2018-05-25 ENCOUNTER — Ambulatory Visit (INDEPENDENT_AMBULATORY_CARE_PROVIDER_SITE_OTHER): Payer: Medicaid Other | Admitting: Nurse Practitioner

## 2018-05-25 ENCOUNTER — Other Ambulatory Visit (HOSPITAL_COMMUNITY)
Admission: RE | Admit: 2018-05-25 | Discharge: 2018-05-25 | Disposition: A | Discharge status: Home / Self Care | Payer: Medicaid Other | Source: Ambulatory Visit | Attending: Nurse Practitioner | Admitting: Nurse Practitioner

## 2018-05-25 VITALS — BP 137/80 | HR 101 | Wt 213.4 lb

## 2018-05-25 DIAGNOSIS — Z3403 Encounter for supervision of normal first pregnancy, third trimester: Secondary | ICD-10-CM

## 2018-05-25 DIAGNOSIS — Z202 Contact with and (suspected) exposure to infections with a predominantly sexual mode of transmission: Secondary | ICD-10-CM

## 2018-05-25 LAB — OB RESULTS CONSOLE GBS: STREP GROUP B AG: POSITIVE

## 2018-05-25 MED ORDER — VALACYCLOVIR HCL 1 G PO TABS: 1000.0000 mg | ORAL_TABLET | Freq: Two times a day (BID) | ORAL | 0 refills | Status: AC

## 2018-05-25 NOTE — Progress Notes (Signed)
    Subjective:  Christie Ali is a 32 y.o. G1P0000 at [redacted]w[redacted]d being seen today for ongoing prenatal care.  She is currently monitored for the following issues for this low-risk pregnancy and has Supervision of low-risk first pregnancy; Tobacco use during pregnancy; Asthma during pregnancy; Drug abuse during pregnancy ([place]); and Known fetal anomaly, antepartum on their problem list.  Patient reports lesion that she is wondering if it is herpes, some headaches and some visual flashing..  Contractions: Irregular. Vag. Bleeding: None.  Movement: Present. Denies leaking of fluid. Had a lesion come up about 3 days ago on her buttock.  Looked online and is thinking it is herpes.  Is painful and has noticed tender lump in her left groin.  Denies any fever or body aches.  Denies any other known outbreak.  The following portions of the patient's history were reviewed and updated as appropriate: allergies, current medications, past family history, past medical history, past social history, past surgical history and problem list. Problem list updated.  Objective:   Vitals:   05/25/18 1004  BP: 137/80  Pulse: (!) 101  Weight: 213 lb 6.4 oz (96.8 kg)    Fetal Status: Fetal Heart Rate (bpm): 160 Fundal Height: 39 cm Movement: Present     General:  Alert, oriented and cooperative. Patient is in no acute distress.  Skin: Skin is warm and dry. No rash noted.   Cardiovascular: Normal heart rate noted  Respiratory: Normal respiratory effort, no problems with respiration noted  Abdomen: Soft, gravid, appropriate for gestational age. Pain/Pressure: Absent     Pelvic:  Cervical exam deferred       Vaginal swabs done.  At the top of the gluteal fold, there is a lesion on the left side approx 12 mm in diameter.  Had a thin dry crust - unroofed and sample sent for testing.  Tender to touch.  Extremities: Normal range of motion.  Edema: Moderate pitting, indentation subsides rapidly  Mental Status: Normal mood and  affect. Normal behavior. Normal judgment and thought content.   Urinalysis:      Assessment and Plan:  Pregnancy: G1P0000 at [redacted]w[redacted]d  1. Encounter for supervision of low-risk first pregnancy in third trimester BP not elevated but is higher than usual.  With ankle edema,, report of headaches and flashing lights visually, will draw baseline labs to be vigilant for preeclampsia. - Culture, beta strep (group b only) - GC/Chlamydia probe amp ([place])not at [facility]  2. Possible exposure to STD Clinically appears to be very suspicious for herpes so will treat with Valtrex.  Explained to client and medication order sent to her pharmacy. - Herpes simplex virus(hsv) dna by pcr  Preterm labor symptoms and general obstetric precautions including but not limited to vaginal bleeding, contractions, leaking of fluid and fetal movement were reviewed in detail with the patient. Please refer to After Visit Summary for other counseling recommendations.  Return in about 1 week (around 06/01/2018).  Earlie Server, RN, MSN, NP-BC Nurse Practitioner, [facility] for [employer], [place] [facility] 05/25/2018 10:32 AM

## 2018-05-25 NOTE — Patient Instructions (Addendum)
Braxton Hicks Contractions Contractions of the uterus can occur throughout pregnancy, but they are not always a sign that you are in labor. You may have practice contractions called Braxton Hicks contractions. These false labor contractions are sometimes confused with true labor. What are [place] contractions? Braxton Hicks contractions are tightening movements that occur in the muscles of the uterus before labor. Unlike true labor contractions, these contractions do not result in opening (dilation) and thinning of the cervix. Toward the end of pregnancy (32-34 weeks), Braxton Hicks contractions can happen more often and may become stronger. These contractions are sometimes difficult to tell apart from true labor because they can be very uncomfortable. You should not feel embarrassed if you go to the hospital with false labor. Sometimes, the only way to tell if you are in true labor is for your health care provider to look for changes in the cervix. The health care provider will do a physical exam and may monitor your contractions. If you are not in true labor, the exam should show that your cervix is not dilating and your water has not broken. If there are other health problems associated with your pregnancy, it is completely safe for you to be sent home with false labor. You may continue to have Braxton Hicks contractions until you go into true labor. How to tell the difference between true labor and false labor True labor  Contractions last 30-70 seconds.  Contractions become very regular.  Discomfort is usually felt in the top of the uterus, and it spreads to the lower abdomen and low back.  Contractions do not go away with walking.  Contractions usually become more intense and increase in frequency.  The cervix dilates and gets thinner. False labor  Contractions are usually shorter and not as strong as true labor contractions.  Contractions are usually irregular.  Contractions  are often felt in the front of the lower abdomen and in the groin.  Contractions may go away when you walk around or change positions while lying down.  Contractions get weaker and are shorter-lasting as time goes on.  The cervix usually does not dilate or become thin. Follow these instructions at home:  Take over-the-counter and prescription medicines only as told by your health care provider.  Keep up with your usual exercises and follow other instructions from your health care provider.  Eat and drink lightly if you think you are going into labor.  If Braxton Hicks contractions are making you uncomfortable: ? Change your position from lying down or resting to walking, or change from walking to resting. ? Sit and rest in a tub of warm water. ? Drink enough fluid to keep your urine pale yellow. Dehydration may cause these contractions. ? Do slow and deep breathing several times an hour.  Keep all follow-up prenatal visits as told by your health care provider. This is important. Contact a health care provider if:  You have a fever.  You have continuous pain in your abdomen. Get help right away if:  Your contractions become stronger, more regular, and closer together.  You have fluid leaking or gushing from your vagina.  You pass blood-tinged mucus (bloody show).  You have bleeding from your vagina.  You have low back pain that you never had before.  You feel your baby's head pushing down and causing pelvic pressure.  Your baby is not moving inside you as much as it used to. Summary  Contractions that occur before labor are called Braxton  Hicks contractions, false labor, or practice contractions.  Braxton Hicks contractions are usually shorter, weaker, farther apart, and less regular than true labor contractions. True labor contractions usually become progressively stronger and regular and they become more frequent.  Manage discomfort from [facility] contractions by  changing position, resting in a warm bath, drinking plenty of water, or practicing deep breathing. This information is not intended to replace advice given to you by your health care provider. Make sure you discuss any questions you have with your health care provider. Document Released: 04/21/2017 Document Revised: 04/21/2017 Document Reviewed: 04/21/2017 Elsevier Interactive Patient Education  2018 [employer].    Genital Herpes Genital herpes is a common sexually transmitted infection (STI) that is caused by a virus. The virus spreads from person to person through sexual contact. Infection can cause itching, blisters, and sores around the genitals or rectum. Symptoms may last several days and then go away This is called an outbreak. However, the virus remains in your body, so you may have more outbreaks in the future. The time between outbreaks varies and can be months or years. Genital herpes affects men and women. It is particularly concerning for pregnant women because the virus can be passed to the baby during delivery and can cause serious problems. Genital herpes is also a concern for people who have a weak disease-fighting (immune) system. What are the causes? This condition is caused by the herpes simplex virus (HSV) type 1 or type 2. The virus may spread through:  Sexual contact with an infected person, including vaginal, anal, and oral sex.  Contact with fluid from a herpes sore.  The skin. This means that you can get herpes from an infected partner even if he or she does not have a visible sore or does not know that he or she is infected.  What increases the risk? You are more likely to develop this condition if:  You have sex with many partners.  You do not use latex condoms during sex.  What are the signs or symptoms? Most people do not have symptoms (asymptomatic) or have mild symptoms that may be mistaken for other skin problems. Symptoms may include:  Small red bumps  near the genitals, rectum, or mouth. These bumps turn into blisters and then turn into sores.  Flu-like symptoms, including: ? Fever. ? Body aches. ? Swollen lymph nodes. ? Headache.  Painful urination.  Pain and itching in the genital area or rectal area.  Vaginal discharge.  Tingling or shooting pain in the legs and buttocks.  Generally, symptoms are more severe and last longer during the first (primary) outbreak. Flu-like symptoms are also more common during the primary outbreak. How is this diagnosed? Genital herpes may be diagnosed based on:  A physical exam.  Your medical history.  Blood tests.  A test of a fluid sample (culture) from an open sore.  How is this treated? There is no cure for this condition, but treatment with antiviral medicines that are taken by mouth (orally) can do the following:  Speed up healing and relieve symptoms.  Help to reduce the spread of the virus to sexual partners.  Limit the chance of future outbreaks, or make future outbreaks shorter.  Lessen symptoms of future outbreaks.  Your health care provider may also recommend pain relief medicines, such as aspirin or ibuprofen. Follow these instructions at home: Sexual activity  Do not have sexual contact during active outbreaks.  Practice safe sex. Latex condoms and female condoms  may help prevent the spread of the herpes virus. General instructions  Keep the affected areas dry and clean.  Take over-the-counter and prescription medicines only as told by your health care provider.  Avoid rubbing or touching blisters and sores. If you do touch blisters or sores: ? Wash your hands thoroughly with soap and water. ? Do not touch your eyes afterward.  To help relieve pain or itching, you may take the following actions as directed by your health care provider: ? Apply a cold, wet cloth (cold compress) to affected areas 4-6 times a day. ? Apply a substance that protects your skin and  reduces bleeding (astringent). ? Apply a gel that helps relieve pain around sores (lidocaine gel). ? Take a warm, shallow bath that cleans the genital area (sitz bath).  Keep all follow-up visits as told by your health care provider. This is important. How is this prevented?  Use condoms. Although anyone can get genital herpes during sexual contact, even with the use of a condom, a condom can provide some protection.  Avoid having multiple sexual partners.  Talk with your sexual partner about any symptoms either of you may have. Also, talk with your partner about any history of STIs.  Get tested for STIs before you have sex. Ask your partner to do the same.  Do not have sexual contact if you have symptoms of genital herpes. Contact a health care provider if:  Your symptoms are not improving with medicine.  Your symptoms return.  You have new symptoms.  You have a fever.  You have abdominal pain.  You have redness, swelling, or pain in your eye.  You notice new sores on other parts of your body.  You are a woman and experience bleeding between menstrual periods.  You have had herpes and you become pregnant or plan to become pregnant. Summary  Genital herpes is a common sexually transmitted infection (STI) that is caused by the herpes simplex virus (HSV) type 1 or type 2.  These viruses are most often spread through sexual contact with an infected person.  You are more likely to develop this condition if you have sex with many partners or you have unprotected sex.  Most people do not have symptoms (asymptomatic) or have mild symptoms that may be mistaken for other skin problems. Symptoms occur as outbreaks that may happen months or years apart.  There is no cure for this condition, but treatment with oral antiviral medicines can reduce symptoms, reduce the chance of spreading the virus to a partner, prevent future outbreaks, or shorten future outbreaks. This information is  not intended to replace advice given to you by your health care provider. Make sure you discuss any questions you have with your health care provider. Document Released: 12/03/2000 Document Revised: 11/05/2016 Document Reviewed: 11/05/2016 Elsevier Interactive Patient Education  [employer].

## 2018-05-26 LAB — GC/CHLAMYDIA PROBE AMP (~~LOC~~) NOT AT ARMC
CHLAMYDIA, DNA PROBE: NEGATIVE
NEISSERIA GONORRHEA: NEGATIVE

## 2018-05-26 LAB — URINALYSIS, ROUTINE W REFLEX MICROSCOPIC
Bilirubin, UA: NEGATIVE
Glucose, UA: NEGATIVE
Ketones, UA: NEGATIVE
Nitrite, UA: NEGATIVE
PH UA: 7 (ref 5.0–7.5)
Protein, UA: NEGATIVE
RBC, UA: NEGATIVE
SPEC GRAV UA: 1.01 (ref 1.005–1.030)
Urobilinogen, Ur: 0.2 mg/dL (ref 0.2–1.0)

## 2018-05-26 LAB — MICROSCOPIC EXAMINATION
CASTS: NONE SEEN /LPF
RBC, UA: NONE SEEN /hpf (ref 0–2)

## 2018-05-26 LAB — PROTEIN / CREATININE RATIO, URINE
CREATININE, UR: 70 mg/dL
Protein, Ur: 16.3 mg/dL
Protein/Creat Ratio: 233 mg/g creat — ABNORMAL HIGH (ref 0–200)

## 2018-05-27 LAB — COMPREHENSIVE METABOLIC PANEL
A/G RATIO: 1.4 (ref 1.2–2.2)
ALBUMIN: 3.3 g/dL — AB (ref 3.5–5.5)
ALK PHOS: 164 IU/L — AB (ref 39–117)
ALT: 15 IU/L (ref 0–32)
AST: 24 IU/L (ref 0–40)
BILIRUBIN TOTAL: 0.3 mg/dL (ref 0.0–1.2)
BUN / CREAT RATIO: 6 — AB (ref 9–23)
BUN: 5 mg/dL — AB (ref 6–20)
CHLORIDE: 103 mmol/L (ref 96–106)
CO2: 22 mmol/L (ref 20–29)
Calcium: 9.1 mg/dL (ref 8.7–10.2)
Creatinine, Ser: 0.79 mg/dL (ref 0.57–1.00)
GFR calc Af Amer: 116 mL/min/{1.73_m2} (ref >59)
GFR calc non Af Amer: 101 mL/min/{1.73_m2} (ref >59)
GLOBULIN, TOTAL: 2.4 g/dL (ref 1.5–4.5)
GLUCOSE: 95 mg/dL (ref 65–99)
POTASSIUM: 3.8 mmol/L (ref 3.5–5.2)
SODIUM: 138 mmol/L (ref 134–144)
Total Protein: 5.7 g/dL — ABNORMAL LOW (ref 6.0–8.5)

## 2018-05-27 LAB — CBC
HEMOGLOBIN: 12.5 g/dL (ref 11.1–15.9)
Hematocrit: 36.4 % (ref 34.0–46.6)
MCH: 32.8 pg (ref 26.6–33.0)
MCHC: 34.3 g/dL (ref 31.5–35.7)
MCV: 96 fL (ref 79–97)
Platelets: 247 10*3/uL (ref 150–450)
RBC: 3.81 x10E6/uL (ref 3.77–5.28)
RDW: 13.6 % (ref 12.3–15.4)
WBC: 16.3 10*3/uL — ABNORMAL HIGH (ref 3.4–10.8)

## 2018-05-27 LAB — HERPES SIMPLEX VIRUS(HSV) DNA BY PCR
HSV 2 DNA: NEGATIVE
HSV 2 DNA: POSITIVE — AB
HSV-1 DNA: NEGATIVE
HSV-1 DNA: NEGATIVE

## 2018-05-28 LAB — CULTURE, BETA STREP (GROUP B ONLY): Strep Gp B Culture: POSITIVE — AB

## 2018-05-30 ENCOUNTER — Encounter: Payer: Self-pay | Admitting: Nurse Practitioner

## 2018-05-30 ENCOUNTER — Ambulatory Visit: Payer: Medicaid Other

## 2018-05-30 DIAGNOSIS — B009 Herpesviral infection, unspecified: Secondary | ICD-10-CM | POA: Insufficient documentation

## 2018-05-30 DIAGNOSIS — O99820 Streptococcus B carrier state complicating pregnancy: Secondary | ICD-10-CM | POA: Insufficient documentation

## 2018-05-30 MED ORDER — VALACYCLOVIR HCL 500 MG PO TABS: 500.0000 mg | ORAL_TABLET | Freq: Two times a day (BID) | ORAL | 1 refills | Status: AC

## 2018-05-30 NOTE — Progress Notes (Signed)
HSV Type 2 positive at [redacted] weeks pregnant.  Client notified via MyChart and medication sent to the pharmacy.  Was treated for the initial outbreak and now will follow with Valtrex 500 mg BID throughout the rest of the pregnancy.    Earlie Server, RN, MSN, NP-BC Nurse Practitioner, [facility] for [employer], [place] [facility] 05/30/2018 7:18 PM

## 2018-05-30 NOTE — Addendum Note (Signed)
Addended by: [place] on: 05/30/2018 07:18 PM   Modules accepted: Orders

## 2018-06-02 ENCOUNTER — Ambulatory Visit (INDEPENDENT_AMBULATORY_CARE_PROVIDER_SITE_OTHER): Payer: Medicaid Other | Admitting: Advanced Practice Midwife

## 2018-06-02 VITALS — BP 127/84 | HR 73 | Wt 209.0 lb

## 2018-06-02 DIAGNOSIS — B009 Herpesviral infection, unspecified: Secondary | ICD-10-CM

## 2018-06-02 DIAGNOSIS — Z3403 Encounter for supervision of normal first pregnancy, third trimester: Secondary | ICD-10-CM

## 2018-06-02 DIAGNOSIS — J45909 Unspecified asthma, uncomplicated: Secondary | ICD-10-CM

## 2018-06-02 DIAGNOSIS — O99332 Smoking (tobacco) complicating pregnancy, second trimester: Secondary | ICD-10-CM

## 2018-06-02 DIAGNOSIS — O359XX0 Maternal care for (suspected) fetal abnormality and damage, unspecified, not applicable or unspecified: Secondary | ICD-10-CM

## 2018-06-02 DIAGNOSIS — O99519 Diseases of the respiratory system complicating pregnancy, unspecified trimester: Principal | ICD-10-CM

## 2018-06-02 DIAGNOSIS — O99323 Drug use complicating pregnancy, third trimester: Secondary | ICD-10-CM

## 2018-06-02 DIAGNOSIS — O99513 Diseases of the respiratory system complicating pregnancy, third trimester: Secondary | ICD-10-CM

## 2018-06-02 DIAGNOSIS — O99333 Smoking (tobacco) complicating pregnancy, third trimester: Secondary | ICD-10-CM

## 2018-06-02 DIAGNOSIS — F1910 Other psychoactive substance abuse, uncomplicated: Secondary | ICD-10-CM

## 2018-06-02 DIAGNOSIS — O99820 Streptococcus B carrier state complicating pregnancy: Secondary | ICD-10-CM

## 2018-06-02 DIAGNOSIS — O99320 Drug use complicating pregnancy, unspecified trimester: Secondary | ICD-10-CM

## 2018-06-02 NOTE — Patient Instructions (Signed)

## 2018-06-02 NOTE — Progress Notes (Signed)
   PRENATAL VISIT NOTE  Subjective:  Christie Ali is a 32 y.o. G1P0000 at [redacted]w[redacted]d being seen today for ongoing prenatal care.  She is currently monitored for the following issues for this low-risk pregnancy and has Supervision of low-risk first pregnancy; Tobacco use during pregnancy; Asthma during pregnancy; Drug abuse during pregnancy ([place]); Known fetal anomaly, antepartum; Group B Streptococcus carrier, antepartum; and HSV-2 infection on their problem list.  Patient reports no complaints.  Contractions: Irritability. Vag. Bleeding: None.  Movement: Present. Denies leaking of fluid.   The following portions of the patient's history were reviewed and updated as appropriate: allergies, current medications, past family history, past medical history, past social history, past surgical history and problem list. Problem list updated.  Objective:   Vitals:   06/02/18 1046 06/02/18 1049  BP: (!) 135/98 127/84  Pulse: 73   Weight: 209 lb (94.8 kg)     Fetal Status: Fetal Heart Rate (bpm): 150 Fundal Height: 38 cm Movement: Present  Presentation: Vertex  General:  Alert, oriented and cooperative. Patient is in no acute distress.  Skin: Skin is warm and dry. No rash noted.   Cardiovascular: Normal heart rate noted  Respiratory: Normal respiratory effort, no problems with respiration noted  Abdomen: Soft, gravid, appropriate for gestational age.  Pain/Pressure: Present     Pelvic: Cervical exam performed Dilation: 1 Effacement (%): 50 Station: -2  Extremities: Normal range of motion.  Edema: Trace  Mental Status: Normal mood and affect. Normal behavior. Normal judgment and thought content.   Assessment and Plan:  Pregnancy: G1P0000 at [redacted]w[redacted]d  1. Asthma during pregnancy --Well controlled  2. Maternal tobacco use in second trimester   3. Encounter for supervision of low-risk first pregnancy in third trimester --Anticipatory guidance about next visits/weeks of pregnancy --BP elevated,  retake wnl. No h/a, epigastric pain or visual disturbances. --Preeclampsia precautions reviewed.    4. Known fetal anomaly, antepartum, single or unspecified fetus --polydactyly   5. HSV-2 infection --Primary outbreak at 22 weeks.  Symptoms fully resolved, on 10 days of treatment dose, Valtrex 1000 mg BID, then will switch to 500 mg BID. --Visual inspection today with no evidence of outbreak.  6. Drug abuse during pregnancy ([place]) --Positive THC on 04/27/18, pt denies use since 02/2018 so retest at delivery  7. Group B Streptococcus carrier, antepartum --Discussed, questions answered about prophylaxis in labor  Term labor symptoms and general obstetric precautions including but not limited to vaginal bleeding, contractions, leaking of fluid and fetal movement were reviewed in detail with the patient. Please refer to After Visit Summary for other counseling recommendations.  No follow-ups on file.  Future Appointments  Date Time Provider [place]  06/09/2018  9:55 AM Danielle Rankin [facility]  06/16/2018  9:55 AM [place], Rona Ravens, NP [facility] W[facility]    Fatima Blank, CNM

## 2018-06-09 ENCOUNTER — Ambulatory Visit (INDEPENDENT_AMBULATORY_CARE_PROVIDER_SITE_OTHER): Payer: Medicaid Other | Admitting: Medical

## 2018-06-09 ENCOUNTER — Encounter: Payer: Self-pay | Admitting: Medical

## 2018-06-09 VITALS — BP 130/76 | HR 90 | Wt 217.4 lb

## 2018-06-09 DIAGNOSIS — O359XX0 Maternal care for (suspected) fetal abnormality and damage, unspecified, not applicable or unspecified: Secondary | ICD-10-CM

## 2018-06-09 DIAGNOSIS — B009 Herpesviral infection, unspecified: Secondary | ICD-10-CM

## 2018-06-09 DIAGNOSIS — Z3400 Encounter for supervision of normal first pregnancy, unspecified trimester: Secondary | ICD-10-CM

## 2018-06-09 DIAGNOSIS — F1910 Other psychoactive substance abuse, uncomplicated: Secondary | ICD-10-CM

## 2018-06-09 DIAGNOSIS — O99320 Drug use complicating pregnancy, unspecified trimester: Secondary | ICD-10-CM

## 2018-06-09 DIAGNOSIS — O99820 Streptococcus B carrier state complicating pregnancy: Secondary | ICD-10-CM

## 2018-06-09 DIAGNOSIS — Z3403 Encounter for supervision of normal first pregnancy, third trimester: Secondary | ICD-10-CM

## 2018-06-09 DIAGNOSIS — O99323 Drug use complicating pregnancy, third trimester: Secondary | ICD-10-CM

## 2018-06-09 DIAGNOSIS — O2603 Excessive weight gain in pregnancy, third trimester: Secondary | ICD-10-CM

## 2018-06-09 NOTE — Progress Notes (Signed)
   PRENATAL VISIT NOTE  Subjective:  Christie Ali is a 32 y.o. G1P0000 at [redacted]w[redacted]d being seen today for ongoing prenatal care.  She is currently monitored for the following issues for this low-risk pregnancy and has Supervision of low-risk first pregnancy; Tobacco use during pregnancy; Asthma during pregnancy; Drug abuse during pregnancy ([place]); Known fetal anomaly, antepartum; Group B Streptococcus carrier, antepartum; and HSV-2 infection on their problem list.  Patient reports no complaints.  Contractions: Not present. Vag. Bleeding: None.  Movement: Present. Denies leaking of fluid.   The following portions of the patient's history were reviewed and updated as appropriate: allergies, current medications, past family history, past medical history, past social history, past surgical history and problem list. Problem list updated.  Objective:   Vitals:   06/09/18 0949  BP: 130/76  Pulse: 90  Weight: 217 lb 6.4 oz (98.6 kg)    Fetal Status: Fetal Heart Rate (bpm): 126   Movement: Present     General:  Alert, oriented and cooperative. Patient is in no acute distress.  Skin: Skin is warm and dry. No rash noted.   Cardiovascular: Normal heart rate noted  Respiratory: Normal respiratory effort, no problems with respiration noted  Abdomen: Soft, gravid, appropriate for gestational age.  Pain/Pressure: Absent     Pelvic: Cervical exam performed        Extremities: Normal range of motion.  Edema: Moderate pitting, indentation subsides rapidly  Mental Status: Normal mood and affect. Normal behavior. Normal judgment and thought content.   Assessment and Plan:  Pregnancy: G1P0000 at [redacted]w[redacted]d  1. Encounter for supervision of low-risk first pregnancy, antepartum   2. Drug abuse during pregnancy ([place]) Pt. Still smoking. Educated on Cessation risks  3. Known fetal anomaly, antepartum, single or unspecified fetus   Term labor symptoms and general obstetric precautions including but not limited  to vaginal bleeding, contractions, leaking of fluid and fetal movement were reviewed in detail with the patient. Please refer to After Visit Summary for other counseling recommendations.  No follow-ups on file.  Follow up in one week  Future Appointments  Date Time Provider [place]  06/16/2018  9:55 AM Ephraim Hamburger, Rona Ravens, NP [place], RN, FNP (student)

## 2018-06-09 NOTE — Progress Notes (Signed)
   PRENATAL VISIT NOTE  Subjective:  Christie Ali is a 32 y.o. G1P0000 at [redacted]w[redacted]d being seen today for ongoing prenatal care.  She is currently monitored for the following issues for this high-risk pregnancy and has Supervision of low-risk first pregnancy; Tobacco use during pregnancy; Asthma during pregnancy; Drug abuse during pregnancy ([place]); Known fetal anomaly, antepartum; Group B Streptococcus carrier, antepartum; and HSV-2 infection on their problem list.  Patient reports no complaints.  Contractions: Not present. Vag. Bleeding: None.  Movement: Present. Denies leaking of fluid.   The following portions of the patient's history were reviewed and updated as appropriate: allergies, current medications, past family history, past medical history, past social history, past surgical history and problem list. Problem list updated.  Objective:   Vitals:   06/09/18 0949  BP: 130/76  Pulse: 90  Weight: 217 lb 6.4 oz (98.6 kg)    Fetal Status: Fetal Heart Rate (bpm): 126 Fundal Height: 39 cm Movement: Present  Presentation: Vertex  General:  Alert, oriented and cooperative. Patient is in no acute distress.  Skin: Skin is warm and dry. No rash noted.   Cardiovascular: Normal heart rate noted  Respiratory: Normal respiratory effort, no problems with respiration noted  Abdomen: Soft, gravid, appropriate for gestational age.  Pain/Pressure: Absent     Pelvic: Cervical exam performed Dilation: 1 Effacement (%): 50 Station: -2  Extremities: Normal range of motion.  Edema: Moderate pitting, indentation subsides rapidly  Mental Status: Normal mood and affect. Normal behavior. Normal judgment and thought content.   Assessment and Plan:  Pregnancy: G1P0000 at [redacted]w[redacted]d  1. Encounter for supervision of low-risk first pregnancy, antepartum  2. Drug abuse during pregnancy [facility]) - Last UDS 5/9 +THC again  3. Known fetal anomaly, antepartum, single or unspecified fetus - Isolated polydactyly   4.  Group B Streptococcus carrier, antepartum - Treat in labor, patient aware   5. HSV-2 infection - On suppression therapy   6. Excess weight gain in pregnancy, third trimester - Discussed weight gain, patient aware  - 2+ pedal edema, no headaches, normotensive today - warning signs for pre-eclampsia reviewed   Term labor symptoms and general obstetric precautions including but not limited to vaginal bleeding, contractions, leaking of fluid and fetal movement were reviewed in detail with the patient. Please refer to After Visit Summary for other counseling recommendations.  Return in about 1 week (around 06/16/2018) for LOB.  Future Appointments  Date Time Provider [place]  06/16/2018  9:55 AM Ephraim Hamburger, Rona Ravens, NP [facility]    Kerry Hough, PA-C

## 2018-06-09 NOTE — Patient Instructions (Signed)
Research childbirth classes and hospital preregistration at [email or web address]  Fetal Movement Counts Patient Name: ________________________________________________ Patient Due Date: ____________________ What is a fetal movement count? A fetal movement count is the number of times that you feel your baby move during a certain amount of time. This may also be called a fetal kick count. A fetal movement count is recommended for every pregnant woman. You may be asked to start counting fetal movements as early as week 28 of your pregnancy. Pay attention to when your baby is most active. You may notice your baby's sleep and wake cycles. You may also notice things that make your baby move more. You should do a fetal movement count:  When your baby is normally most active.  At the same time each day.  A good time to count movements is while you are resting, after having something to eat and drink. How do I count fetal movements? 1. Find a quiet, comfortable area. Sit, or lie down on your side. 2. Write down the date, the start time and stop time, and the number of movements that you felt between those two times. Take this information with you to your health care visits. 3. For 2 hours, count kicks, flutters, swishes, rolls, and jabs. You should feel at least 10 movements during 2 hours. 4. You may stop counting after you have felt 10 movements. 5. If you do not feel 10 movements in 2 hours, have something to eat and drink. Then, keep resting and counting for 1 hour. If you feel at least 4 movements during that hour, you may stop counting. Contact a health care provider if:  You feel fewer than 4 movements in 2 hours.  Your baby is not moving like he or she usually does. Date: ____________ Start time: ____________ Stop time: ____________ Movements: ____________ Date: ____________ Start time: ____________ Stop time: ____________ Movements: ____________ Date: ____________ Start time: ____________  Stop time: ____________ Movements: ____________ Date: ____________ Start time: ____________ Stop time: ____________ Movements: ____________ Date: ____________ Start time: ____________ Stop time: ____________ Movements: ____________ Date: ____________ Start time: ____________ Stop time: ____________ Movements: ____________ Date: ____________ Start time: ____________ Stop time: ____________ Movements: ____________ Date: ____________ Start time: ____________ Stop time: ____________ Movements: ____________ Date: ____________ Start time: ____________ Stop time: ____________ Movements: ____________ This information is not intended to replace advice given to you by your health care provider. Make sure you discuss any questions you have with your health care provider. Document Released: 01/05/2007 Document Revised: 08/04/2016 Document Reviewed: 01/15/2016 Elsevier Interactive Patient Education  2018 Else[employer] Inc[employer]  Braxton Hicks Contractions Contractions of the uterus can occur throughout pregnancy, but they are not always a sign that you are in labor. You may have practice contractions called Braxton Hicks contractions. These false labor contractions are sometimes confused with true labor. What are Braxton Hicks contractions? Braxton Hicks contractions are tightening movements that occur in the muscles of the uterus before labor. Unlike true labor contractions, these contractions do not result in opening (dilation) and thinning of the cervix. Toward the end of pregnancy (32-34 weeks), Braxton Hicks contractions can happen more often and may become stronger. These contractions are sometimes difficult to tell apart from true labor because they can be very uncomfortable. You should not feel embarrassed if you go to the hospital with false labor. Sometimes, the only way to tell if you are in true labor is for your health care provider to look for changes in the cervix. The health care provider will   do a physical  exam and may monitor your contractions. If you are not in true labor, the exam should show that your cervix is not dilating and your water has not broken. If there are other health problems associated with your pregnancy, it is completely safe for you to be sent home with false labor. You may continue to have Braxton Hicks contractions until you go into true labor. How to tell the difference between true labor and false labor True labor  Contractions last 30-70 seconds.  Contractions become very regular.  Discomfort is usually felt in the top of the uterus, and it spreads to the lower abdomen and low back.  Contractions do not go away with walking.  Contractions usually become more intense and increase in frequency.  The cervix dilates and gets thinner. False labor  Contractions are usually shorter and not as strong as true labor contractions.  Contractions are usually irregular.  Contractions are often felt in the front of the lower abdomen and in the groin.  Contractions may go away when you walk around or change positions while lying down.  Contractions get weaker and are shorter-lasting as time goes on.  The cervix usually does not dilate or become thin. Follow these instructions at home:  Take over-the-counter and prescription medicines only as told by your health care provider.  Keep up with your usual exercises and follow other instructions from your health care provider.  Eat and drink lightly if you think you are going into labor.  If Braxton Hicks contractions are making you uncomfortable: ? Change your position from lying down or resting to walking, or change from walking to resting. ? Sit and rest in a tub of warm water. ? Drink enough fluid to keep your urine pale yellow. Dehydration may cause these contractions. ? Do slow and deep breathing several times an hour.  Keep all follow-up prenatal visits as told by your health care provider. This is  important. Contact a health care provider if:  You have a fever.  You have continuous pain in your abdomen. Get help right away if:  Your contractions become stronger, more regular, and closer together.  You have fluid leaking or gushing from your vagina.  You pass blood-tinged mucus (bloody show).  You have bleeding from your vagina.  You have low back pain that you never had before.  You feel your baby's head pushing down and causing pelvic pressure.  Your baby is not moving inside you as much as it used to. Summary  Contractions that occur before labor are called Braxton Hicks contractions, false labor, or practice contractions.  Braxton Hicks contractions are usually shorter, weaker, farther apart, and less regular than true labor contractions. True labor contractions usually become progressively stronger and regular and they become more frequent.  Manage discomfort from Braxton Hicks contractions by changing position, resting in a warm bath, drinking plenty of water, or practicing deep breathing. This information is not intended to replace advice given to you by your health care provider. Make sure you discuss any questions you have with your health care provider. Document Released: 04/21/2017 Document Revised: 04/21/2017 Document Reviewed: 04/21/2017 Elsevier Interactive Patient Education  2018 Elsevier Inc.    

## 2018-06-09 NOTE — Progress Notes (Signed)
C/o occasional headaches, sometimes visual disturbances. Denies epigastric pain. C/o a lot of reflux.

## 2018-06-15 ENCOUNTER — Encounter: Payer: Self-pay | Admitting: Nurse Practitioner

## 2018-06-16 ENCOUNTER — Encounter (HOSPITAL_COMMUNITY): Payer: Self-pay | Admitting: *Deleted

## 2018-06-16 ENCOUNTER — Ambulatory Visit (INDEPENDENT_AMBULATORY_CARE_PROVIDER_SITE_OTHER): Payer: Medicaid Other | Admitting: Nurse Practitioner

## 2018-06-16 ENCOUNTER — Telehealth (HOSPITAL_COMMUNITY): Payer: Self-pay | Admitting: *Deleted

## 2018-06-16 VITALS — BP 129/81 | HR 73 | Wt 216.6 lb

## 2018-06-16 DIAGNOSIS — O99320 Drug use complicating pregnancy, unspecified trimester: Secondary | ICD-10-CM

## 2018-06-16 DIAGNOSIS — Z3400 Encounter for supervision of normal first pregnancy, unspecified trimester: Secondary | ICD-10-CM

## 2018-06-16 DIAGNOSIS — O359XX0 Maternal care for (suspected) fetal abnormality and damage, unspecified, not applicable or unspecified: Secondary | ICD-10-CM

## 2018-06-16 DIAGNOSIS — J45909 Unspecified asthma, uncomplicated: Secondary | ICD-10-CM

## 2018-06-16 DIAGNOSIS — O99820 Streptococcus B carrier state complicating pregnancy: Secondary | ICD-10-CM

## 2018-06-16 DIAGNOSIS — O99519 Diseases of the respiratory system complicating pregnancy, unspecified trimester: Secondary | ICD-10-CM

## 2018-06-16 DIAGNOSIS — F1910 Other psychoactive substance abuse, uncomplicated: Secondary | ICD-10-CM

## 2018-06-16 DIAGNOSIS — B009 Herpesviral infection, unspecified: Secondary | ICD-10-CM

## 2018-06-16 DIAGNOSIS — O99330 Smoking (tobacco) complicating pregnancy, unspecified trimester: Secondary | ICD-10-CM

## 2018-06-16 NOTE — Progress Notes (Signed)
    Subjective:  Christie Ali is a 32 y.o. G1P0000 at [redacted]w[redacted]d being seen today for ongoing prenatal care.  She is currently monitored for the following issues for this low-risk pregnancy and has Supervision of low-risk first pregnancy; Tobacco use during pregnancy; Asthma during pregnancy; Drug abuse during pregnancy ([place]); Known fetal anomaly, antepartum; Group B Streptococcus carrier, antepartum; and HSV-2 infection on their problem list.  Patient reports occasional contractions, some low back pain, no bleeding.  Contractions: Irregular. Vag. Bleeding: None.  Movement: Present. Denies leaking of fluid.   The following portions of the patient's history were reviewed and updated as appropriate: allergies, current medications, past family history, past medical history, past social history, past surgical history and problem list. Problem list updated.  Objective:   Vitals:   06/16/18 1051  BP: 129/81  Pulse: 73  Weight: 216 lb 9.6 oz (98.2 kg)    Fetal Status: Fetal Heart Rate (bpm): 136 Fundal Height: 41 cm Movement: Present  Presentation: Vertex  General:  Alert, oriented and cooperative. Patient is in no acute distress.  Skin: Skin is warm and dry. No rash noted.   Cardiovascular: Normal heart rate noted  Respiratory: Normal respiratory effort, no problems with respiration noted  Abdomen: Soft, gravid, appropriate for gestational age. Pain/Pressure: Present     Pelvic:  Cervical exam performed Dilation: Fingertip Effacement (%): Thick Station: -2  Speculum exam - no pooling, no discharge seen, no leaking with valsalva. Minimal white grainy adherent discharge - dry - does not seem to be a yeast infection - more like normal vaginal discharge.  Extremities: Normal range of motion.  Edema: Mild pitting, slight indentation  Mental Status: Normal mood and affect. Normal behavior. Normal judgment and thought content.     Assessment and Plan:  Pregnancy: G1P0000 at [redacted]w[redacted]d  1. Encounter  for supervision of low-risk first pregnancy, antepartum Will schedule for induction at 41 week - cervix currently not open and not favorable for foley bulb at the current time  2. Asthma during pregnancy 3. Tobacco use during pregnancy, antepartum Still smokes some 4. Drug abuse during pregnancy ([place]) 5. Known fetal anomaly, antepartum, single or unspecified fetus 6. Group B Streptococcus carrier, antepartum 7. HSV-2 infection   Term labor symptoms and general obstetric precautions including but not limited to vaginal bleeding, contractions, leaking of fluid and fetal movement were reviewed in detail with the patient. Please refer to After Visit Summary for other counseling recommendations.  Return in about 1 week (around 06/23/2018).  Earlie Server, RN, MSN, NP-BC Nurse Practitioner, [facility] for [employer], [place] [employer] 06/16/2018 11:28 AM

## 2018-06-16 NOTE — Progress Notes (Signed)
Addendum: Scheduled for IOL 06/29/18 0000

## 2018-06-16 NOTE — Patient Instructions (Signed)

## 2018-06-16 NOTE — Telephone Encounter (Signed)
Preadmission screen  

## 2018-06-19 ENCOUNTER — Encounter: Payer: Self-pay | Admitting: Certified Nurse Midwife

## 2018-06-19 ENCOUNTER — Telehealth: Payer: Self-pay | Admitting: General Practice

## 2018-06-19 ENCOUNTER — Telehealth: Payer: Self-pay

## 2018-06-19 NOTE — Telephone Encounter (Signed)
PT sent a mychart message about having contractions that are lasting 2-3 minutes. She is a clinic patient but, the message got sent to [place] office. I sent her a message telling her to call her office but, she said she cannot get through. I sent the message to a clinic nurse but, I'm not sure when she'll have a chance to see the message so I called the pt and left a message on her phone letting her know that if the contractions are 5 minutes apart or less and if her water breaks then she needs to go to MAU. I provided [place] office phone number and asked the pt to call the office to speak to me.

## 2018-06-19 NOTE — Telephone Encounter (Signed)
Called patient regarding mychart message and asked if she was feeling better. Patient states she is, she was having contractions every 5-10 minutes this morning but they have gone away since. Discussed with patient, contractions will increase in frequency at this point in her pregnancy. Discussed if she starts having contractions, get up and walk around and drink plenty of water. If you are having contractions every 4-5 minutes for longer than an hour, come in to be evaluated. Patient verbalized understanding & had no questions.

## 2018-06-21 ENCOUNTER — Encounter (HOSPITAL_COMMUNITY): Payer: Self-pay | Admitting: *Deleted

## 2018-06-21 ENCOUNTER — Inpatient Hospital Stay (HOSPITAL_COMMUNITY)
Admission: AD | Admit: 2018-06-21 | Discharge: 2018-06-24 | DRG: 807 | Disposition: A | Discharge status: Home / Self Care | Payer: Medicaid Other | Source: Ambulatory Visit | Attending: Obstetrics & Gynecology | Admitting: Obstetrics & Gynecology

## 2018-06-21 ENCOUNTER — Inpatient Hospital Stay (HOSPITAL_COMMUNITY): Payer: Medicaid Other | Admitting: Anesthesiology

## 2018-06-21 ENCOUNTER — Other Ambulatory Visit: Payer: Self-pay

## 2018-06-21 ENCOUNTER — Ambulatory Visit (INDEPENDENT_AMBULATORY_CARE_PROVIDER_SITE_OTHER): Payer: Medicaid Other | Admitting: Advanced Practice Midwife

## 2018-06-21 VITALS — BP 146/94 | HR 92 | Wt 221.9 lb

## 2018-06-21 DIAGNOSIS — O99334 Smoking (tobacco) complicating childbirth: Secondary | ICD-10-CM | POA: Diagnosis present

## 2018-06-21 DIAGNOSIS — O99324 Drug use complicating childbirth: Secondary | ICD-10-CM | POA: Diagnosis present

## 2018-06-21 DIAGNOSIS — F17210 Nicotine dependence, cigarettes, uncomplicated: Secondary | ICD-10-CM | POA: Diagnosis present

## 2018-06-21 DIAGNOSIS — O9952 Diseases of the respiratory system complicating childbirth: Secondary | ICD-10-CM | POA: Diagnosis present

## 2018-06-21 DIAGNOSIS — O99333 Smoking (tobacco) complicating pregnancy, third trimester: Secondary | ICD-10-CM

## 2018-06-21 DIAGNOSIS — O133 Gestational [pregnancy-induced] hypertension without significant proteinuria, third trimester: Secondary | ICD-10-CM

## 2018-06-21 DIAGNOSIS — O359XX0 Maternal care for (suspected) fetal abnormality and damage, unspecified, not applicable or unspecified: Secondary | ICD-10-CM

## 2018-06-21 DIAGNOSIS — O139 Gestational [pregnancy-induced] hypertension without significant proteinuria, unspecified trimester: Secondary | ICD-10-CM

## 2018-06-21 DIAGNOSIS — O134 Gestational [pregnancy-induced] hypertension without significant proteinuria, complicating childbirth: Secondary | ICD-10-CM | POA: Diagnosis not present

## 2018-06-21 DIAGNOSIS — Z3400 Encounter for supervision of normal first pregnancy, unspecified trimester: Secondary | ICD-10-CM

## 2018-06-21 DIAGNOSIS — O98513 Other viral diseases complicating pregnancy, third trimester: Secondary | ICD-10-CM

## 2018-06-21 DIAGNOSIS — J45909 Unspecified asthma, uncomplicated: Secondary | ICD-10-CM

## 2018-06-21 DIAGNOSIS — O99513 Diseases of the respiratory system complicating pregnancy, third trimester: Secondary | ICD-10-CM

## 2018-06-21 DIAGNOSIS — O99330 Smoking (tobacco) complicating pregnancy, unspecified trimester: Secondary | ICD-10-CM

## 2018-06-21 DIAGNOSIS — O99320 Drug use complicating pregnancy, unspecified trimester: Secondary | ICD-10-CM

## 2018-06-21 DIAGNOSIS — O99824 Streptococcus B carrier state complicating childbirth: Secondary | ICD-10-CM | POA: Diagnosis present

## 2018-06-21 DIAGNOSIS — O99323 Drug use complicating pregnancy, third trimester: Secondary | ICD-10-CM

## 2018-06-21 DIAGNOSIS — F1290 Cannabis use, unspecified, uncomplicated: Secondary | ICD-10-CM | POA: Diagnosis present

## 2018-06-21 DIAGNOSIS — Z3A39 39 weeks gestation of pregnancy: Secondary | ICD-10-CM

## 2018-06-21 DIAGNOSIS — F1910 Other psychoactive substance abuse, uncomplicated: Secondary | ICD-10-CM

## 2018-06-21 DIAGNOSIS — O99820 Streptococcus B carrier state complicating pregnancy: Secondary | ICD-10-CM

## 2018-06-21 DIAGNOSIS — O99519 Diseases of the respiratory system complicating pregnancy, unspecified trimester: Secondary | ICD-10-CM

## 2018-06-21 DIAGNOSIS — Z3A40 40 weeks gestation of pregnancy: Secondary | ICD-10-CM | POA: Diagnosis not present

## 2018-06-21 DIAGNOSIS — R030 Elevated blood-pressure reading, without diagnosis of hypertension: Secondary | ICD-10-CM | POA: Diagnosis present

## 2018-06-21 DIAGNOSIS — B009 Herpesviral infection, unspecified: Secondary | ICD-10-CM

## 2018-06-21 LAB — COMPREHENSIVE METABOLIC PANEL
ALK PHOS: 202 U/L — AB (ref 38–126)
ALT: 12 U/L (ref 0–44)
AST: 23 U/L (ref 15–41)
Albumin: 2.9 g/dL — ABNORMAL LOW (ref 3.5–5.0)
Anion gap: 9 (ref 5–15)
BUN: 7 mg/dL (ref 6–20)
CALCIUM: 8.9 mg/dL (ref 8.9–10.3)
CO2: 21 mmol/L — AB (ref 22–32)
CREATININE: 0.78 mg/dL (ref 0.44–1.00)
Chloride: 105 mmol/L (ref 98–111)
GFR calc Af Amer: >60 mL/min (ref >60)
GFR calc non Af Amer: >60 mL/min (ref >60)
Glucose, Bld: 86 mg/dL (ref 70–99)
Potassium: 3.9 mmol/L (ref 3.5–5.1)
SODIUM: 135 mmol/L (ref 135–145)
Total Bilirubin: 0.5 mg/dL (ref 0.3–1.2)
Total Protein: 6.4 g/dL — ABNORMAL LOW (ref 6.5–8.1)

## 2018-06-21 LAB — ABO/RH: ABO/RH(D): B POS

## 2018-06-21 LAB — CBC
HCT: 36.1 % (ref 36.0–46.0)
HEMOGLOBIN: 12.2 g/dL (ref 12.0–15.0)
MCH: 32.2 pg (ref 26.0–34.0)
MCHC: 33.8 g/dL (ref 30.0–36.0)
MCV: 95.3 fL (ref 78.0–100.0)
Platelets: 222 10*3/uL (ref 150–400)
RBC: 3.79 MIL/uL — AB (ref 3.87–5.11)
RDW: 13.2 % (ref 11.5–15.5)
WBC: 16.5 10*3/uL — ABNORMAL HIGH (ref 4.0–10.5)

## 2018-06-21 LAB — RAPID URINE DRUG SCREEN, HOSP PERFORMED
AMPHETAMINES: NOT DETECTED
BENZODIAZEPINES: NOT DETECTED
Cocaine: NOT DETECTED
Opiates: NOT DETECTED
Tetrahydrocannabinol: POSITIVE — AB

## 2018-06-21 LAB — TYPE AND SCREEN
ABO/RH(D): B POS
ANTIBODY SCREEN: NEGATIVE

## 2018-06-21 LAB — PROTEIN / CREATININE RATIO, URINE: Creatinine, Urine: 35 mg/dL

## 2018-06-21 MED ORDER — DIPHENHYDRAMINE HCL 50 MG/ML IJ SOLN
12.5000 mg | INTRAMUSCULAR | Status: DC | PRN
  Filled 2018-06-21: qty 1

## 2018-06-21 MED ORDER — BUPIVACAINE HCL (PF) 0.25 % IJ SOLN
INTRAMUSCULAR | Status: DC | PRN
  Administered 2018-06-21 (×2): 5 mL via EPIDURAL

## 2018-06-21 MED ORDER — OXYTOCIN 10 UNIT/ML IJ SOLN
10.0000 [IU] | Freq: Once | INTRAMUSCULAR | Status: DC | PRN
  Filled 2018-06-21: qty 1

## 2018-06-21 MED ORDER — OXYCODONE-ACETAMINOPHEN 5-325 MG PO TABS: 2.0000 | ORAL_TABLET | ORAL | Status: DC | PRN

## 2018-06-21 MED ORDER — LACTATED RINGERS IV SOLN
500.0000 mL | Freq: Once | INTRAVENOUS | Status: AC
  Administered 2018-06-21: 500 mL via INTRAVENOUS

## 2018-06-21 MED ORDER — LIDOCAINE HCL (PF) 1 % IJ SOLN
30.0000 mL | INTRAMUSCULAR | Status: DC | PRN
  Filled 2018-06-21: qty 30

## 2018-06-21 MED ORDER — NICOTINE 14 MG/24HR TD PT24
14.0000 mg | MEDICATED_PATCH | Freq: Every day | TRANSDERMAL | Status: DC
  Administered 2018-06-22: 14 mg via TRANSDERMAL
  Filled 2018-06-21 (×6): qty 1

## 2018-06-21 MED ORDER — FENTANYL CITRATE (PF) 100 MCG/2ML IJ SOLN
INTRAMUSCULAR | Status: DC | PRN
  Administered 2018-06-21 (×2): 50 ug via EPIDURAL

## 2018-06-21 MED ORDER — LACTATED RINGERS IV SOLN: 500.0000 mL | INTRAVENOUS | Status: DC | PRN

## 2018-06-21 MED ORDER — NICOTINE 14 MG/24HR TD PT24: 14.0000 mg | MEDICATED_PATCH | Freq: Every day | TRANSDERMAL | Status: DC

## 2018-06-21 MED ORDER — LIDOCAINE HCL (PF) 1 % IJ SOLN
INTRAMUSCULAR | Status: DC | PRN
  Administered 2018-06-21: 4 mL via EPIDURAL

## 2018-06-21 MED ORDER — ACETAMINOPHEN 325 MG PO TABS: 650.0000 mg | ORAL_TABLET | ORAL | Status: DC | PRN

## 2018-06-21 MED ORDER — PHENYLEPHRINE 40 MCG/ML (10ML) SYRINGE FOR IV PUSH (FOR BLOOD PRESSURE SUPPORT)
80.0000 ug | PREFILLED_SYRINGE | INTRAVENOUS | Status: DC | PRN
  Filled 2018-06-21: qty 5

## 2018-06-21 MED ORDER — OXYTOCIN 40 UNITS IN LACTATED RINGERS INFUSION - SIMPLE MED
1.0000 m[IU]/min | INTRAVENOUS | Status: DC
  Administered 2018-06-21: 2 m[IU]/min via INTRAVENOUS
  Filled 2018-06-21: qty 1000

## 2018-06-21 MED ORDER — PENICILLIN G POT IN DEXTROSE 60000 UNIT/ML IV SOLN
3.0000 10*6.[IU] | INTRAVENOUS | Status: DC
  Administered 2018-06-21 – 2018-06-22 (×3): 3 10*6.[IU] via INTRAVENOUS
  Filled 2018-06-21 (×6): qty 50

## 2018-06-21 MED ORDER — OXYCODONE-ACETAMINOPHEN 5-325 MG PO TABS: 1.0000 | ORAL_TABLET | ORAL | Status: DC | PRN

## 2018-06-21 MED ORDER — DIPHENHYDRAMINE HCL 50 MG/ML IJ SOLN
12.5000 mg | INTRAMUSCULAR | Status: DC | PRN
  Administered 2018-06-21 (×2): 12.5 mg via INTRAVENOUS

## 2018-06-21 MED ORDER — SOD CITRATE-CITRIC ACID 500-334 MG/5ML PO SOLN: 30.0000 mL | ORAL | Status: DC | PRN

## 2018-06-21 MED ORDER — EPHEDRINE 5 MG/ML INJ
10.0000 mg | INTRAVENOUS | Status: DC | PRN
  Filled 2018-06-21: qty 2

## 2018-06-21 MED ORDER — EPHEDRINE 5 MG/ML INJ
10.0000 mg | INTRAVENOUS | Status: DC | PRN
Start: 2018-06-21 — End: 2018-06-21

## 2018-06-21 MED ORDER — FENTANYL CITRATE (PF) 100 MCG/2ML IJ SOLN
100.0000 ug | INTRAMUSCULAR | Status: DC | PRN
  Filled 2018-06-21 (×2): qty 2

## 2018-06-21 MED ORDER — FENTANYL CITRATE (PF) 100 MCG/2ML IJ SOLN: 100.0000 ug | Freq: Once | INTRAMUSCULAR | Status: DC

## 2018-06-21 MED ORDER — LACTATED RINGERS IV SOLN
INTRAVENOUS | Status: DC
  Administered 2018-06-21 – 2018-06-22 (×2): via INTRAVENOUS

## 2018-06-21 MED ORDER — OXYTOCIN BOLUS FROM INFUSION
500.0000 mL | Freq: Once | INTRAVENOUS | Status: AC
  Administered 2018-06-22: 500 mL via INTRAVENOUS

## 2018-06-21 MED ORDER — HYDRALAZINE HCL 20 MG/ML IJ SOLN: 10.0000 mg | Freq: Once | INTRAMUSCULAR | Status: DC | PRN

## 2018-06-21 MED ORDER — TERBUTALINE SULFATE 1 MG/ML IJ SOLN
0.2500 mg | Freq: Once | INTRAMUSCULAR | Status: DC | PRN
  Filled 2018-06-21: qty 1

## 2018-06-21 MED ORDER — LABETALOL HCL 5 MG/ML IV SOLN: 20.0000 mg | INTRAVENOUS | Status: DC | PRN

## 2018-06-21 MED ORDER — ONDANSETRON HCL 4 MG/2ML IJ SOLN
4.0000 mg | Freq: Four times a day (QID) | INTRAMUSCULAR | Status: DC | PRN
  Administered 2018-06-21 – 2018-06-22 (×2): 4 mg via INTRAVENOUS
  Filled 2018-06-21 (×2): qty 2

## 2018-06-21 MED ORDER — OXYTOCIN 40 UNITS IN LACTATED RINGERS INFUSION - SIMPLE MED: 2.5000 [IU]/h | INTRAVENOUS | Status: DC

## 2018-06-21 MED ORDER — DIPHENHYDRAMINE HCL 25 MG PO CAPS: 25.0000 mg | ORAL_CAPSULE | Freq: Every evening | ORAL | Status: DC | PRN

## 2018-06-21 MED ORDER — LACTATED RINGERS IV SOLN: 500.0000 mL | Freq: Once | INTRAVENOUS | Status: DC

## 2018-06-21 MED ORDER — PHENYLEPHRINE 40 MCG/ML (10ML) SYRINGE FOR IV PUSH (FOR BLOOD PRESSURE SUPPORT): 80.0000 ug | PREFILLED_SYRINGE | INTRAVENOUS | Status: DC | PRN

## 2018-06-21 MED ORDER — FENTANYL 2.5 MCG/ML BUPIVACAINE 1/10 % EPIDURAL INFUSION (WH - ANES)
14.0000 mL/h | INTRAMUSCULAR | Status: DC | PRN
  Administered 2018-06-21 – 2018-06-22 (×2): 14 mL/h via EPIDURAL
  Filled 2018-06-21 (×2): qty 100

## 2018-06-21 MED ORDER — PHENYLEPHRINE 40 MCG/ML (10ML) SYRINGE FOR IV PUSH (FOR BLOOD PRESSURE SUPPORT)
80.0000 ug | PREFILLED_SYRINGE | INTRAVENOUS | Status: DC | PRN
  Filled 2018-06-21: qty 10
  Filled 2018-06-21: qty 5

## 2018-06-21 MED ORDER — SODIUM CHLORIDE 0.9 % IV SOLN
5.0000 10*6.[IU] | Freq: Once | INTRAVENOUS | Status: AC
  Administered 2018-06-21: 5 10*6.[IU] via INTRAVENOUS
  Filled 2018-06-21: qty 5

## 2018-06-21 MED ORDER — TERBUTALINE SULFATE 1 MG/ML IJ SOLN: 0.2500 mg | Freq: Once | INTRAMUSCULAR | Status: DC | PRN

## 2018-06-21 NOTE — MAU Provider Note (Addendum)
  History     CSN: [id number]  Arrival date and time: 06/21/18 0856    Chief Complaint  Patient presents with  . PIH EVAL   Patient is a 32 year old G1P0 at [redacted]w[redacted]d who presents with a chief complaint of an evaluation of new onset of hypertension during pregnancy. She was evaluated this morning in the [place] and found to have elevated pressures. Her past three blood pressure readings were 145/95, 137/95, and 153/90 mmHg. She endorses abdominal pressure and intermittent contractions and is otherwise asymptomatic. Patient's planned IOL is 06/29/18.     Past Medical History:  Diagnosis Date  . Asthma     Past Surgical History:  Procedure Laterality Date  . NO PAST SURGERIES      Family History  Problem Relation Age of Onset  . Hypertension Mother   . Hypertension Father   . Cancer Maternal Grandmother   . Cancer Paternal Grandmother     Social History   Tobacco Use  . Smoking status: Current Every Day Smoker    Packs/day: 0.25    Types: Cigarettes  . Smokeless tobacco: Never Used  Substance Use Topics  . Alcohol use: No  . Drug use: Yes    Types: Marijuana    Allergies:  Allergies  Allergen Reactions  . Sulfa Antibiotics Hives  . Other Rash    Nickel     Medications Prior to Admission  Medication Sig Dispense Refill Last Dose  . acetaminophen (TYLENOL) 500 MG tablet Take 1,000 mg by mouth every 6 (six) hours as needed for mild pain or headache.   06/20/2018 at Unknown time  . nicotine (NICODERM CQ - DOSED IN MG/24 HOURS) 14 mg/24hr patch Place 1 patch (14 mg total) onto the skin daily. 28 patch 0 Past Month at Unknown time  . Prenatal Vit-Fe Fumarate-FA (MULTIVITAMIN-PRENATAL) 27-0.8 MG TABS tablet Take 1 tablet by mouth daily at 12 noon.   06/21/2018 at Unknown time  . valACYclovir (VALTREX) 500 MG tablet Take 1 tablet (500 mg total) by mouth 2 (two) times daily. 60 tablet 1 06/21/2018 at Unknown time    Review of Systems  Eyes: Negative for visual disturbance.   Cardiovascular:       Feet swollen  Gastrointestinal: Negative for abdominal pain, diarrhea, nausea and vomiting.  Genitourinary: Negative for vaginal bleeding, vaginal discharge and vaginal pain.  Neurological: Negative for dizziness, light-headedness and headaches.   Physical Exam   Blood pressure 129/88, pulse 72, temperature 98 F (36.7 C), resp. rate 16, height 5\' 9"  (1.753 m), weight 100.7 kg (222 lb), last menstrual period 09/06/2017.  Physical Exam  Constitutional: She appears well-developed.  Cardiovascular: Normal rate and regular rhythm. Exam reveals no gallop and no friction rub.  No murmur heard. Respiratory: Breath sounds normal. She has no wheezes. She has no rales.  GI: There is no tenderness. There is no guarding.  Genitourinary:  Genitourinary Comments: Dilation: 1 cm. Effacement: 90% Station: -2. Bulging bag of water.  Musculoskeletal: She exhibits edema.  Bilateral 2+ pitting pedal edema.    MAU Course   MDM CBC CMP PCr Cervical ripening balloon insertion  Assessment and Plan  Induction of Labor  -Patient to be admitted to L&D following insertion of cervical ripening balloon.   Sherie Don 06/21/2018, 9:56 AM

## 2018-06-21 NOTE — MAU Note (Signed)
Pt presents to MAU after AM appointment for [facility] evaluation. Pt denies any VB or LOF. Reports mild headache

## 2018-06-21 NOTE — Anesthesia Preprocedure Evaluation (Signed)
Anesthesia Evaluation  Patient identified by MRN, date of birth, ID band Patient awake    Reviewed: Allergy & Precautions  Airway Mallampati: II       Dental no notable dental hx.    Pulmonary asthma , Current Smoker,    Pulmonary exam normal        Cardiovascular hypertension, Normal cardiovascular exam     Neuro/Psych    GI/Hepatic Neg liver ROS,   Endo/Other    Renal/GU negative Renal ROS     Musculoskeletal   Abdominal   Peds  Hematology negative hematology ROS (+)   Anesthesia Other Findings   Reproductive/Obstetrics (+) Pregnancy                             Anesthesia Physical Anesthesia Plan  ASA: II  Anesthesia Plan: Epidural   Post-op Pain Management:    Induction:   PONV Risk Score and Plan:   Airway Management Planned: Natural Airway  Additional Equipment: None  Intra-op Plan:   Post-operative Plan:   Informed Consent: I have reviewed the patients History and Physical, chart, labs and discussed the procedure including the risks, benefits and alternatives for the proposed anesthesia with the patient or authorized representative who has indicated his/her understanding and acceptance.     Plan Discussed with:   Anesthesia Plan Comments: (Lab Results      Component                Value               Date                      WBC                      16.5 (H)            06/21/2018                HGB                      12.2                06/21/2018                HCT                      36.1                06/21/2018                MCV                      95.3                06/21/2018                PLT                      222                 06/21/2018           )        Anesthesia Quick Evaluation

## 2018-06-21 NOTE — Progress Notes (Signed)
Patient ID: Christie Ali, female   DOB: 1986-05-13, 32 y.o.   MRN: [id number]  Has a 'hot spot' with epidural; relived a bit with PCA epid  BP 147/74, other VSS FHR 120s, +accels, no decels Ctx q 2 mins w/ Pit @ 4mu/min Cx 8+/90/0  IUP@term  gHTN Active labor/transion Pain control issues  Anesthesia coming in to eval pain Plan to check cx in 2 hrs or sooner prn Anticipate SVD BPs stable  Brigitte Pulse, Castulo Scarpelli CNM 06/21/2018

## 2018-06-21 NOTE — Patient Instructions (Addendum)
Labor Induction Labor induction is when steps are taken to cause a pregnant woman to begin the labor process. Most women go into labor on their own between 37 weeks and 42 weeks of the pregnancy. When this does not happen or when there is a medical need, methods may be used to induce labor. Labor induction causes a pregnant woman's uterus to contract. It also causes the cervix to soften (ripen), open (dilate), and thin out (efface). Usually, labor is not induced before 39 weeks of the pregnancy unless there is a problem with the baby or mother. Before inducing labor, your health care provider will consider a number of factors, including the following:  The medical condition of you and the baby.  How many weeks along you are.  The status of the baby's lung maturity.  The condition of the cervix.  The position of the baby. What are the reasons for labor induction? Labor may be induced for the following reasons:  The health of the baby or mother is at risk.  The pregnancy is overdue by 1 week or more.  The water breaks but labor does not start on its own.  The mother has a health condition or serious illness, such as high blood pressure, infection, placental abruption, or diabetes.  The amniotic fluid amounts are low around the baby.  The baby is distressed. Convenience or wanting the baby to be born on a certain date is not a reason for inducing labor. What methods are used for labor induction? Several methods of labor induction may be used, such as:  Prostaglandin medicine. This medicine causes the cervix to dilate and ripen. The medicine will also start contractions. It can be taken by mouth or by inserting a suppository into the vagina.  Inserting a thin tube (catheter) with a balloon on the end into the vagina to dilate the cervix. Once inserted, the balloon is expanded with water, which causes the cervix to open.  Stripping the membranes. Your health care provider separates  amniotic sac tissue from the cervix, causing the cervix to be stretched and causing the release of a hormone called progesterone. This may cause the uterus to contract. It is often done during an office visit. You will be sent home to wait for the contractions to begin. You will then come in for an induction.  Breaking the water. Your health care provider makes a hole in the amniotic sac using a small instrument. Once the amniotic sac breaks, contractions should begin. This may still take hours to see an effect.  Medicine to trigger or strengthen contractions. This medicine is given through an IV access tube inserted into a vein in your arm. All of the methods of induction, besides stripping the membranes, will be done in the hospital. Induction is done in the hospital so that you and the baby can be carefully monitored. How long does it take for labor to be induced? Some inductions can take up to 2-3 days. Depending on the cervix, it usually takes less time. It takes longer when you are induced early in the pregnancy or if this is your first pregnancy. If a mother is still pregnant and the induction has been going on for 2-3 days, either the mother will be sent home or a cesarean delivery will be needed. What are the risks associated with labor induction? Some of the risks of induction include:  Changes in fetal heart rate, such as too high, too low, or erratic.  Fetal distress.    Chance of infection for the mother and baby.  Increased chance of having a cesarean delivery.  Breaking off (abruption) of the placenta from the uterus (rare).  Uterine rupture (very rare). When induction is needed for medical reasons, the benefits of induction may outweigh the risks. What are some reasons for not inducing labor? Labor induction should not be done if:  It is shown that your baby does not tolerate labor.  You have had previous surgeries on your uterus, such as a myomectomy or the removal of  fibroids.  Your placenta lies very low in the uterus and blocks the opening of the cervix (placenta previa).  Your baby is not in a head-down position.  The umbilical cord drops down into the birth canal in front of the baby. This could cut off the baby's blood and oxygen supply.  You have had a previous cesarean delivery.  There are unusual circumstances, such as the baby being extremely premature. This information is not intended to replace advice given to you by your health care provider. Make sure you discuss any questions you have with your health care provider. Document Released: 04/27/2007 Document Revised: 05/13/2016 Document Reviewed: 07/05/2013 Elsevier Interactive Patient Education  2017 Elsevier Inc.  

## 2018-06-21 NOTE — MAU Note (Signed)
Foley bulb inserted by Sunday Corn CNM. 60cc of saline inserted, pt tolerated well

## 2018-06-21 NOTE — H&P (Signed)
LABOR AND DELIVERY ADMISSION HISTORY AND PHYSICAL NOTE  Christie Ali is a 32 y.o. female G1P0000 with IUP at [redacted]w[redacted]d by LMP was sent to MAU for PEC evaluation. She was seen at [facility] this AM and found to have today elevated BPs. She reports that she is having abdominal pressure and intermittent UC's. She also has a c/o swollen feet. She denies H/A, dizziness, blurry vision, N/V/D. She denies leakage of fluid or vaginal bleeding. She reports good (+) FM today. She is now admitted for gHTN without proteinuria. Cervical balloon placed prior to admission.   Prenatal History/Complications: PNC at [facility] Pregnancy complications:  - (+) GBS - gHTN  Past Medical History: Past Medical History:  Diagnosis Date  . Asthma     Past Surgical History: Past Surgical History:  Procedure Laterality Date  . NO PAST SURGERIES      Obstetrical History: OB History    Gravida  1   Para  0   Term  0   Preterm  0   AB  0   Living  0     SAB  0   TAB  0   Ectopic  0   Multiple  0   Live Births  0           Social History: Social History   Socioeconomic History  . Marital status: Single    Spouse name: Not on file  . Number of children: Not on file  . Years of education: Not on file  . Highest education level: Not on file  Occupational History  . Not on file  Social Needs  . Financial resource strain: Not on file  . Food insecurity:    Worry: Not on file    Inability: Not on file  . Transportation needs:    Medical: Not on file    Non-medical: Not on file  Tobacco Use  . Smoking status: Current Every Day Smoker    Packs/day: 0.25    Types: Cigarettes  . Smokeless tobacco: Never Used  Substance and Sexual Activity  . Alcohol use: No  . Drug use: Yes    Types: Marijuana  . Sexual activity: Yes  Lifestyle  . Physical activity:    Days per week: Not on file    Minutes per session: Not on file  . Stress: Not on file  Relationships  . Social connections:     Talks on phone: Not on file    Gets together: Not on file    Attends religious service: Not on file    Active member of club or organization: Not on file    Attends meetings of clubs or organizations: Not on file    Relationship status: Not on file  Other Topics Concern  . Not on file  Social History Narrative  . Not on file    Family History: Family History  Problem Relation Age of Onset  . Hypertension Mother   . Hypertension Father   . Cancer Maternal Grandmother   . Cancer Paternal Grandmother     Allergies: Allergies  Allergen Reactions  . Sulfa Antibiotics Hives  . Other Rash    Nickel     Medications Prior to Admission  Medication Sig Dispense Refill Last Dose  . acetaminophen (TYLENOL) 500 MG tablet Take 1,000 mg by mouth every 6 (six) hours as needed for mild pain or headache.   06/20/2018 at Unknown time  . nicotine (NICODERM CQ - DOSED IN MG/24 HOURS) 14 mg/24hr  patch Place 1 patch (14 mg total) onto the skin daily. 28 patch 0 Past Month at Unknown time  . Prenatal Vit-Fe Fumarate-FA (MULTIVITAMIN-PRENATAL) 27-0.8 MG TABS tablet Take 1 tablet by mouth daily at 12 noon.   06/21/2018 at Unknown time  . valACYclovir (VALTREX) 500 MG tablet Take 1 tablet (500 mg total) by mouth 2 (two) times daily. 60 tablet 1 06/21/2018 at Unknown time     Review of Systems  All systems reviewed and negative except as stated in HPI  Physical Exam Patient Vitals for the past 24 hrs:  BP Temp Pulse Resp Height Weight  06/21/18 1101 (!) 153/90 - 85 - - -  06/21/18 1055 (!) 137/95 - 75 - - -  06/21/18 1031 (!) 145/95 - 82 - - -  06/21/18 1016 (!) 141/95 - 68 - - -  06/21/18 1001 (!) 140/98 - 65 - - -  06/21/18 0946 121/88 - 65 - - -  06/21/18 0931 129/88 - 72 - - -  06/21/18 0916 131/90 - 74 - - -  06/21/18 0913 (!) 133/96 98 F (36.7 C) 77 16 5\' 9"  (1.753 m) 222 lb (100.7 kg)   General appearance: alert, cooperative and no distress Lungs: clear to auscultation  bilaterally Heart: regular rate and rhythm Abdomen: soft, non-tender; bowel sounds normal Extremities: No calf swelling or tenderness Presentation: cephalic; verified by bedside U/S Sunday Corn, CNM) Fetal monitoring: 125 bpm / moderate variability / accels present / decels absent Uterine activity: Irregular UC's Dilation: 1.5 Effacement (%): 90 Station: -2 Exam by:: Sunday Corn, CNM Cervical balloon in place  Prenatal labs: ABO, Rh: B/Positive/-- (12/12 1120) Antibody: Negative (12/12 1120) Rubella: Non-Immune (12/12 1120) - will need PPMMR RPR: Non Reactive (04/09 0906)  HBsAg: Negative (12/12 1120)  HIV: Non Reactive (04/09 0906)  GC/Chlamydia: Neg/Neg GBS: Positive (06/06 0000)  2 hr Glucola: normal Genetic screening:  normal Anatomy US: normal  Prenatal Transfer Tool  Maternal Diabetes: No Genetic Screening: Normal Maternal Ultrasounds/Referrals: Normal Fetal Ultrasounds or other Referrals:  None Maternal Substance Abuse:  No Significant Maternal Medications:  None Significant Maternal Lab Results: Lab values include: Group B Strep positive  Results for orders placed or performed during the hospital encounter of 06/21/18 (from the past 24 hour(s))  Protein / creatinine ratio, urine   Collection Time: 06/21/18  9:15 AM  Result Value Ref Range   Creatinine, Urine 35.00 mg/dL   Total Protein, Urine <6 mg/dL   Protein Creatinine Ratio        0.00 - 0.15 mg/mg[Cre]  CBC   Collection Time: 06/21/18  9:20 AM  Result Value Ref Range   WBC 16.5 (H) 4.0 - 10.5 K/uL   RBC 3.79 (L) 3.87 - 5.11 MIL/uL   Hemoglobin 12.2 12.0 - 15.0 g/dL   HCT 36.1 36.0 - 46.0 %   MCV 95.3 78.0 - 100.0 fL   MCH 32.2 26.0 - 34.0 pg   MCHC 33.8 30.0 - 36.0 g/dL   RDW 13.2 11.5 - 15.5 %   Platelets 222 150 - 400 K/uL  Comprehensive metabolic panel   Collection Time: 06/21/18  9:20 AM  Result Value Ref Range   Sodium 135 135 - 145 mmol/L   Potassium 3.9 3.5 - 5.1 mmol/L   Chloride 105 98 -  111 mmol/L   CO2 21 (L) 22 - 32 mmol/L   Glucose, Bld 86 70 - 99 mg/dL   BUN 7 6 - 20 mg/dL   Creatinine, Ser 0.78  0.44 - 1.00 mg/dL   Calcium 8.9 8.9 - 10.3 mg/dL   Total Protein 6.4 (L) 6.5 - 8.1 g/dL   Albumin 2.9 (L) 3.5 - 5.0 g/dL   AST 23 15 - 41 U/L   ALT 12 0 - 44 U/L   Alkaline Phosphatase 202 (H) 38 - 126 U/L   Total Bilirubin 0.5 0.3 - 1.2 mg/dL   GFR calc non Af Amer >60 >60 mL/min   GFR calc Af Amer >60 >60 mL/min   Anion gap 9 5 - 15    Patient Active Problem List   Diagnosis Date Noted  . Gestational hypertension 06/21/2018  . Group B Streptococcus carrier, antepartum 05/30/2018  . 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 Known fetal anomaly, antepartum 03/09/2018  . Supervision of low-risk first pregnancy 11/30/2017  . Tobacco use during pregnancy 11/30/2017  . Asthma during pregnancy 11/30/2017  . Drug abuse during pregnancy ([place]) 11/30/2017    Assessment: NICHOL ATOR is a 32 y.o. G1P0000 at [redacted]w[redacted]d here for IOL for gHTN; (+) GBS  #Labor: IOL #Pain: Planning epidural #FWB: Category 1  #ID:  (+) GBS - will receive PCN protocol #MOF: breast #MOC: Depo prior to d/c home #Circ:  n/a  Laury Deep, [place] 06/21/2018, 12:13 PM

## 2018-06-21 NOTE — Progress Notes (Signed)
Labor Progress Note BEATRYCE COLOMBO is a 32 y.o. G1P0000 at [redacted]w[redacted]d presented for IOL for gHTN  S: With some discomfort in positioning but denies pain. Denies headache, blurry vision, RUQ pain.  O:  BP (!) 145/78 (BP Location: Right Arm)   Pulse 88   Temp 98 F (36.7 C) (Axillary)   Resp 17   Ht 5\' 9"  (1.753 m)   Wt 222 lb (100.7 kg)   LMP 09/06/2017 (Exact Date)   SpO2 99%   BMI 32.78 kg/m   OLI:DCVUDTHY rate 125, moderate variability, + acels, few early decels Toco: ctx every 2-3 min  CVE: Dilation: 6 Effacement (%): 90 Cervical Position: Posterior Station: -1 Presentation: Vertex Exam by:: Mary Martinique Johnson, RN   A&P: 32 y.o. G1P0000 [redacted]w[redacted]d here for IOL for gHTN. Labor: Progressing well, currently on 8 of Pitocin Pain: Epidural FWB: Cat I GBS positive - PCN GHTN: 140-150 SBP with highest DBP 90s. Currently asymptomatic. Will continue to monitor closely.  Rory Percy, DO 9:57 PM

## 2018-06-21 NOTE — Progress Notes (Addendum)
Labor Progress Note  Christie Ali is a 32 y.o. G1P0000 at [redacted]w[redacted]d  admitted for induction of labor due to [facility].  S: Doing well but in increasing pain but does not require pain medication yet. Contractions causing increasing pain. States Foley bulb was painful. Denies HA, N/V, or RUQ pain.     O:  BP 133/83   Pulse 66   Temp 98.3 F (36.8 C)   Resp 16   Ht 5\' 9"  (1.753 m)   Wt 100.7 kg (222 lb)   LMP 09/06/2017 (Exact Date)   BMI 32.78 kg/m   No intake/output data recorded.  FHT:  FHR: 130 bpm, variability: moderate,  accelerations:  Present,  decelerations:  Absent UC:   Every 5-8 minutes SVE:   Dilation: 3 Effacement (%): 70 Station: -2 Exam by:: Mary Martinique Johnson, RN   Membranes still intact  Pitocin @ 2 mu/min  Labs: Lab Results  Component Value Date   WBC 16.5 (H) 06/21/2018   HGB 12.2 06/21/2018   HCT 36.1 06/21/2018   MCV 95.3 06/21/2018   PLT 222 06/21/2018    Assessment / Plan: 32 y.o. G1P0000 100w6d in early labor. Induction of labor due to gestational hypertension,  progressing well on pitocin  Labor: Progressing on Pitocin, will continue to increase then AROM Fetal Wellbeing:  Category I Pain Control:  Labor support without medications Anticipated MOD:  NSVD  Gestational Hypertension: BP stable. Will continue to monitor.   Expectant management   Beau Fanny, MS3

## 2018-06-21 NOTE — Anesthesia Pain Management Evaluation Note (Signed)
  CRNA Pain Management Visit Note  Patient: SUKI CROCKETT, 32 y.o., female  "Hello I am a member of the anesthesia team at [facility]. We have an anesthesia team available at all times to provide care throughout the hospital, including epidural management and anesthesia for C-section. I don't know your plan for the delivery whether it a natural birth, water birth, IV sedation, nitrous supplementation, doula or epidural, but we want to meet your pain goals."   1.Was your pain managed to your expectations on prior hospitalizations?   No prior hospitalizations  2.What is your expectation for pain management during this hospitalization?     IV pain meds  3.How can we help you reach that goal?   Record the patient's initial score and the patient's pain goal.   Pain: 4  Pain Goal: 7 [facility] wants you to be able to say your pain was always managed very well.  Jabier Mutton 06/21/2018

## 2018-06-21 NOTE — Progress Notes (Signed)
Labor Progress Note  BENTLIE WITHEM is a 32 y.o. G1P0000 at [redacted]w[redacted]d admitted for induction of labor due to [facility].  S: Doing well. Feeling contractions but not in pain. No medications needed at this time.  Continues to deny PIH symptoms.   O:  BP (!) 144/94   Pulse 73   Temp 97.7 F (36.5 C) (Oral)   Resp 16   Ht 5\' 9"  (1.753 m)   Wt 100.7 kg (222 lb)   LMP 09/06/2017 (Exact Date)   BMI 32.78 kg/m   No intake/output data recorded.  FHT:  FHR: 130 bpm, variability: moderate,  accelerations:  Present,  decelerations:  Absent UC:   Every 3-4 minutes SVE:   Dilation: 5 Effacement (%): 70 Station: -1 Exam by:: Dr. Gerarda Fraction  AROM: light mec 1841  Pitocin @ 8 mu/min  Labs: Lab Results  Component Value Date   WBC 16.5 (H) 06/21/2018   HGB 12.2 06/21/2018   HCT 36.1 06/21/2018   MCV 95.3 06/21/2018   PLT 222 06/21/2018    Assessment / Plan: 32 y.o. G1P0000 [redacted]w[redacted]d in early labor. Induction of labor due to gestational hypertension,  progressing well on pitocin  Labor: Progressing on Pitocin. Will continue to increase. AROM'd.  Fetal Wellbeing:  Category I Pain Control:  Labor support without medications Anticipated MOD:  NSVD  Gestational Hypertension: BP elevated but stable. Will continue to monitor.   Expectant management  Luiz Blare, DO [facility], [facility]

## 2018-06-21 NOTE — Progress Notes (Signed)
Denies headaches or blurred vision ; does c/o sees spots occasionally. 5 lb weight gain noted. Report called to MAU charge nurse- Jolynn and patient taken up to mau. For evaluation.

## 2018-06-21 NOTE — Anesthesia Procedure Notes (Signed)
Epidural Patient location during procedure: OB Start time: 06/21/2018 8:04 PM End time: 06/21/2018 8:10 PM  Staffing Anesthesiologist: Effie Berkshire, MD Performed: anesthesiologist   Preanesthetic Checklist Completed: patient identified, site marked, surgical consent, pre-op evaluation, timeout performed, IV checked, risks and benefits discussed and monitors and equipment checked  Epidural Patient position: sitting Prep: ChloraPrep Patient monitoring: heart rate, continuous pulse ox and blood pressure Approach: midline Location: L3-L4 Injection technique: LOR saline  Needle:  Needle type: Tuohy  Needle gauge: 17 G Needle length: 9 cm Catheter type: closed end flexible Catheter size: 20 Guage Test dose: negative and 1.5% lidocaine  Assessment Events: blood not aspirated, injection not painful, no injection resistance and no paresthesia  Additional Notes LOR @ 6  Patient identified. Risks/Benefits/Options discussed with patient including but not limited to bleeding, infection, nerve damage, paralysis, failed block, incomplete pain control, headache, blood pressure changes, nausea, vomiting, reactions to medications, itching and postpartum back pain. Confirmed with bedside nurse the patient's most recent platelet count. Confirmed with patient that they are not currently taking any anticoagulation, have any bleeding history or any family history of bleeding disorders. Patient expressed understanding and wished to proceed. All questions were answered. Sterile technique was used throughout the entire procedure. Please see nursing notes for vital signs. Test dose was given through epidural catheter and negative prior to continuing to dose epidural or start infusion. Warning signs of high block given to the patient including shortness of breath, tingling/numbness in hands, complete motor block, or any concerning symptoms with instructions to call for help. Patient was given instructions on  fall risk and not to get out of bed. All questions and concerns addressed with instructions to call with any issues or inadequate analgesia.    Reason for block:procedure for pain

## 2018-06-21 NOTE — Progress Notes (Signed)
   PRENATAL VISIT NOTE  Subjective:  SEVANNA BALLENGEE is a 32 y.o. G1P0000 at [redacted]w[redacted]d being seen today for ongoing prenatal care.  She is currently monitored for the following issues for this low-risk pregnancy and has Supervision of low-risk first pregnancy; Tobacco use during pregnancy; Asthma during pregnancy; Drug abuse during pregnancy ([place]); Known fetal anomaly, antepartum; Group B Streptococcus carrier, antepartum; and HSV-2 infection on their problem list.  Patient reports fatigue, no bleeding, no cramping, no leaking and occasional contractions.  Contractions: Irregular. Vag. Bleeding: None.  Movement: Present. Denies leaking of fluid.   The following portions of the patient's history were reviewed and updated as appropriate: allergies, current medications, past family history, past medical history, past social history, past surgical history and problem list. Problem list updated.  Objective:   Vitals:   06/21/18 0827 06/21/18 0835 06/21/18 0848  BP: (!) 130/99 (!) 133/94 (!) 146/94  Pulse: 86 92   Weight: 221 lb 14.4 oz (100.7 kg)      Fetal Status: Fetal Heart Rate (bpm): 136 Fundal Height: 40 cm Movement: Present     General:  Alert, oriented and cooperative. Patient is in no acute distress.  Skin: Skin is warm and dry. No rash noted.   Cardiovascular: Normal heart rate noted  Respiratory: Normal respiratory effort, no problems with respiration noted  Abdomen: Soft, gravid, appropriate for gestational age.  Pain/Pressure: Present     Pelvic: Cervical exam performed      closed/thick/posterior  Extremities: Normal range of motion.  Edema: Moderate pitting, indentation subsides rapidly  Mental Status: Normal mood and affect. Normal behavior. Normal judgment and thought content.   Assessment and Plan:  Pregnancy: G1P0000 at [redacted]w[redacted]d  1. Encounter for supervision of low-risk first pregnancy, antepartum --Feeling well, no complaints  --Confirmed IOL scheduled for 06/29/2018  2.  Asthma affecting pregnancy in third trimester --Controlled, not using inhaler  3. Tobacco use in pregnancy, antepartum, third trimester --Occasional  smoker  4. Drug abuse during pregnancy ([place]) --Positive THC 04/27/2018  5. Known fetal anomaly, antepartum, single or unspecified fetus --Isolated Type II Postaxial Polydactyly of Fetus  6. Group B Streptococcus carrier state affecting pregnancy --GBS + --PCN in labor   7. HSV-2 infection complicating pregnancy, third trimester --On suppression  Elevated pressures in clinic this morning.  5 lb weight gain since Friday 06/17/2018   Bilateral nonpitting 2+ edema in lower extremities. Patient states she has been walking in an attempt to jump start labor. Patient denies RUQ pain, visual disturbances. Reports spots in field of vision but states this has been a problem prior to pregnancy  Sent to MAU for Preeclampsia workup. Report given to R. Renato Battles, CNM in MAU  Future Appointments  Date Time Provider [place]  06/27/2018  9:15 AM Jorje Guild, NP [facility] [place]  06/29/2018 12:00 AM [facility] ROOM [facility] None    Darlina Rumpf, [place]  06/21/18  8:59 AM

## 2018-06-21 NOTE — MAU Provider Note (Signed)
History     CSN: [id number]  Arrival date and time: 06/21/18 0856  Provider's first contact with patient at [facility]  Patient presents with  . PIH EVAL   HPI  Ms.  Christie Ali is a 32 y.o. year old G54P0000 female at [redacted]w[redacted]d weeks gestation who was sent to MAU for PEC evaluation. She was seen at [facility] this AM and found to have today elevated BPs. She reports that she is having abdominal pressure and intermittent UC's. She also has a c/o swollen feet. She denies H/A, dizziness, blurry vision, N/V/D. She reports good (+) FM today.   Past Medical History:  Diagnosis Date  . Asthma     Past Surgical History:  Procedure Laterality Date  . NO PAST SURGERIES      Family History  Problem Relation Age of Onset  . Hypertension Mother   . Hypertension Father   . Cancer Maternal Grandmother   . Cancer Paternal Grandmother     Social History   Tobacco Use  . Smoking status: Current Every Day Smoker    Packs/day: 0.25    Types: Cigarettes  . Smokeless tobacco: Never Used  Substance Use Topics  . Alcohol use: No  . Drug use: Yes    Types: Marijuana    Allergies:  Allergies  Allergen Reactions  . Sulfa Antibiotics Hives  . Other Rash    Nickel     Medications Prior to Admission  Medication Sig Dispense Refill Last Dose  . acetaminophen (TYLENOL) 500 MG tablet Take 1,000 mg by mouth every 6 (six) hours as needed for mild pain or headache.   06/20/2018 at Unknown time  . nicotine (NICODERM CQ - DOSED IN MG/24 HOURS) 14 mg/24hr patch Place 1 patch (14 mg total) onto the skin daily. 28 patch 0 Past Month at Unknown time  . Prenatal Vit-Fe Fumarate-FA (MULTIVITAMIN-PRENATAL) 27-0.8 MG TABS tablet Take 1 tablet by mouth daily at 12 noon.   06/21/2018 at Unknown time  . valACYclovir (VALTREX) 500 MG tablet Take 1 tablet (500 mg total) by mouth 2 (two) times daily. 60 tablet 1 06/21/2018 at Unknown time    Review of Systems  Constitutional: Negative.   HENT:  Negative.   Eyes: Negative.   Respiratory: Negative.   Endocrine: Negative.   Genitourinary: Negative.   Allergic/Immunologic: Negative.   Neurological: Positive for headaches (mild H/A's).  Hematological: Negative.   Psychiatric/Behavioral: Negative.    Physical Exam   Patient Vitals for the past 24 hrs:  BP Temp Pulse Resp Height Weight  06/21/18 1101 (!) 153/90 - 85 - - -  06/21/18 1055 (!) 137/95 - 75 - - -  06/21/18 1031 (!) 145/95 - 82 - - -  06/21/18 1016 (!) 141/95 - 68 - - -  06/21/18 1001 (!) 140/98 - 65 - - -  06/21/18 0946 121/88 - 65 - - -  06/21/18 0931 129/88 - 72 - - -  06/21/18 0916 131/90 - 74 - - -  06/21/18 0913 (!) 133/96 98 F (36.7 C) 77 16 5\' 9"  (1.753 m) 222 lb (100.7 kg)     Physical Exam  Nursing note and vitals reviewed. Constitutional: She is oriented to person, place, and time. She appears well-developed and well-nourished.  HENT:  Head: Normocephalic and atraumatic.  Eyes: Pupils are equal, round, and reactive to light.  Neck: Normal range of motion. Neck supple.  Cardiovascular: Normal rate, regular rhythm, normal heart sounds and intact distal  pulses.  Respiratory: Effort normal and breath sounds normal.  GI: Soft. Bowel sounds are normal.  Genitourinary:  Genitourinary Comments: Dilation: 1 Effacement: 90% Station: -2 Presentation: Vertex Exam by: Sunday Corn CNM  Membranes stripped per pt request  Musculoskeletal: Normal range of motion.  Neurological: She is alert and oriented to person, place, and time. She has normal reflexes.  Skin: Skin is warm and dry.  Psychiatric: She has a normal mood and affect. Her behavior is normal. Judgment and thought content normal.   MAU Course  Procedures  S/p placement of foley balloon catheter for cervical ripening. Induction of labor today for gHTN with normal PEC. Reassuring FHR tracing with no concerns at present. Advised of the possibility vaginal bleeding, Rupture of membranes, painful uterine  contractions q 5 mins or less, severe abdominal discomfort, decreased fetal movement.  MDM CBC CMP P/C Ratio Serial BPs Insert Cervical Balloon L&D Admission orders  *Consult with Dr. Edythe Clarity @ 1130 - notified of patient's complaints, assessments, lab & U/S results, recommended tx plan admit for IOL  Results for orders placed or performed during the hospital encounter of 06/21/18 (from the past 24 hour(s))  Protein / creatinine ratio, urine     Status: None   Collection Time: 06/21/18  9:15 AM  Result Value Ref Range   Creatinine, Urine 35.00 mg/dL   Total Protein, Urine <6 mg/dL   Protein Creatinine Ratio  unable to calculate   0.00 - 0.15 mg/mg[Cre]  CBC     Status: Abnormal   Collection Time: 06/21/18  9:20 AM  Result Value Ref Range   WBC 16.5 (H) 4.0 - 10.5 K/uL   RBC 3.79 (L) 3.87 - 5.11 MIL/uL   Hemoglobin 12.2 12.0 - 15.0 g/dL   HCT 36.1 36.0 - 46.0 %   MCV 95.3 78.0 - 100.0 fL   MCH 32.2 26.0 - 34.0 pg   MCHC 33.8 30.0 - 36.0 g/dL   RDW 13.2 11.5 - 15.5 %   Platelets 222 150 - 400 K/uL  Comprehensive metabolic panel     Status: Abnormal   Collection Time: 06/21/18  9:20 AM  Result Value Ref Range   Sodium 135 135 - 145 mmol/L   Potassium 3.9 3.5 - 5.1 mmol/L   Chloride 105 98 - 111 mmol/L   CO2 21 (L) 22 - 32 mmol/L   Glucose, Bld 86 70 - 99 mg/dL   BUN 7 6 - 20 mg/dL   Creatinine, Ser 0.78 0.44 - 1.00 mg/dL   Calcium 8.9 8.9 - 10.3 mg/dL   Total Protein 6.4 (L) 6.5 - 8.1 g/dL   Albumin 2.9 (L) 3.5 - 5.0 g/dL   AST 23 15 - 41 U/L   ALT 12 0 - 44 U/L   Alkaline Phosphatase 202 (H) 38 - 126 U/L   Total Bilirubin 0.5 0.3 - 1.2 mg/dL   GFR calc non Af Amer >60 >60 mL/min   GFR calc Af Amer >60 >60 mL/min   Anion gap 9 5 - 15    Assessment and Plan  Ms. Christie Ali is a 32 y.o. G1P0000 at [redacted]w[redacted]d admitted for IOL d/t gHTN & Positive GBS  Routine L&D admission orders Dr. Gerarda Fraction made aware of & will assume care of pt upon admission to L&D   Laury Deep, MSN, CNM 06/21/2018, 10:45 AM

## 2018-06-22 ENCOUNTER — Encounter (HOSPITAL_COMMUNITY): Payer: Self-pay

## 2018-06-22 DIAGNOSIS — O99824 Streptococcus B carrier state complicating childbirth: Secondary | ICD-10-CM

## 2018-06-22 DIAGNOSIS — Z3A39 39 weeks gestation of pregnancy: Secondary | ICD-10-CM

## 2018-06-22 DIAGNOSIS — O134 Gestational [pregnancy-induced] hypertension without significant proteinuria, complicating childbirth: Secondary | ICD-10-CM

## 2018-06-22 LAB — RPR: RPR Ser Ql: NONREACTIVE

## 2018-06-22 MED ORDER — OXYCODONE HCL 5 MG PO TABS: 5.0000 mg | ORAL_TABLET | ORAL | Status: DC | PRN

## 2018-06-22 MED ORDER — SIMETHICONE 80 MG PO CHEW: 80.0000 mg | CHEWABLE_TABLET | ORAL | Status: DC | PRN

## 2018-06-22 MED ORDER — ZOLPIDEM TARTRATE 5 MG PO TABS: 5.0000 mg | ORAL_TABLET | Freq: Every evening | ORAL | Status: DC | PRN

## 2018-06-22 MED ORDER — COCONUT OIL OIL
1.0000 "application " | TOPICAL_OIL | Status: DC | PRN
  Administered 2018-06-22: 1 via TOPICAL
  Filled 2018-06-22: qty 120

## 2018-06-22 MED ORDER — DIPHENHYDRAMINE HCL 25 MG PO CAPS: 25.0000 mg | ORAL_CAPSULE | Freq: Four times a day (QID) | ORAL | Status: DC | PRN

## 2018-06-22 MED ORDER — PRENATAL MULTIVITAMIN CH
1.0000 | ORAL_TABLET | Freq: Every day | ORAL | Status: DC
  Administered 2018-06-22 – 2018-06-24 (×3): 1 via ORAL
  Filled 2018-06-22 (×3): qty 1

## 2018-06-22 MED ORDER — IBUPROFEN 600 MG PO TABS
600.0000 mg | ORAL_TABLET | Freq: Four times a day (QID) | ORAL | Status: DC
  Administered 2018-06-22 – 2018-06-24 (×9): 600 mg via ORAL
  Filled 2018-06-22 (×9): qty 1

## 2018-06-22 MED ORDER — DIBUCAINE 1 % RE OINT: 1.0000 "application " | TOPICAL_OINTMENT | RECTAL | Status: DC | PRN

## 2018-06-22 MED ORDER — ACETAMINOPHEN 325 MG PO TABS
650.0000 mg | ORAL_TABLET | ORAL | Status: DC | PRN
Start: 2018-06-22 — End: 2018-06-24
  Administered 2018-06-24: 650 mg via ORAL
  Filled 2018-06-22: qty 2

## 2018-06-22 MED ORDER — ONDANSETRON HCL 4 MG PO TABS: 4.0000 mg | ORAL_TABLET | ORAL | Status: DC | PRN

## 2018-06-22 MED ORDER — BENZOCAINE-MENTHOL 20-0.5 % EX AERO: 1.0000 "application " | INHALATION_SPRAY | CUTANEOUS | Status: DC | PRN

## 2018-06-22 MED ORDER — SENNOSIDES-DOCUSATE SODIUM 8.6-50 MG PO TABS
2.0000 | ORAL_TABLET | ORAL | Status: DC
  Administered 2018-06-22 – 2018-06-24 (×2): 2 via ORAL
  Filled 2018-06-22 (×2): qty 2

## 2018-06-22 MED ORDER — WITCH HAZEL-GLYCERIN EX PADS: 1.0000 "application " | MEDICATED_PAD | CUTANEOUS | Status: DC | PRN

## 2018-06-22 MED ORDER — ERYTHROMYCIN 5 MG/GM OP OINT
TOPICAL_OINTMENT | OPHTHALMIC | Status: AC
  Filled 2018-06-22: qty 1

## 2018-06-22 MED ORDER — TETANUS-DIPHTH-ACELL PERTUSSIS 5-2.5-18.5 LF-MCG/0.5 IM SUSP: 0.5000 mL | Freq: Once | INTRAMUSCULAR | Status: DC

## 2018-06-22 MED ORDER — ONDANSETRON HCL 4 MG/2ML IJ SOLN: 4.0000 mg | INTRAMUSCULAR | Status: DC | PRN

## 2018-06-22 MED ORDER — MEASLES, MUMPS & RUBELLA VAC ~~LOC~~ INJ
0.5000 mL | INJECTION | Freq: Once | SUBCUTANEOUS | Status: AC
  Administered 2018-06-24: 0.5 mL via SUBCUTANEOUS
  Filled 2018-06-22 (×2): qty 0.5

## 2018-06-22 MED ORDER — NICOTINE 14 MG/24HR TD PT24: 14.0000 mg | MEDICATED_PATCH | Freq: Every day | TRANSDERMAL | Status: DC

## 2018-06-22 NOTE — Lactation Note (Signed)
This note was copied from a baby's chart. Lactation Consultation Note  Patient Name: Christie Ali MEQAS'T Date: 06/22/2018 Reason for consult: Initial assessment;Primapara;1st time breastfeeding;Term  P1 mother whose infant is now 87 hours old  Baby awake and quiet; offered to assist mother with latch and she accepted.  Latched baby onto the right breast in the football hold but she was too sleepy to suck.  Gentle stimulation to awaken her was unsuccessful.  Explained to mother this was normal behavior and to continue STS and watching for feeding cues.  Reviewed feeding cues with mother.  Encouraged feeding 8-12 times/24 hours or more if baby shows cues.  Reviewed feeding cues.  Mother has a DEBP for home use.  Mother will call for assistance as needed.  Father present and resting.   Maternal Data Formula Feeding for Exclusion: No Has patient been taught Hand Expression?: Yes Does the patient have breastfeeding experience prior to this delivery?: No  Feeding Feeding Type: Breast Fed Length of feed: 0 min  LATCH Score Latch: Too sleepy or reluctant, no latch achieved, no sucking elicited.  Audible Swallowing: None  Type of Nipple: Everted at rest and after stimulation  Comfort (Breast/Nipple): Soft / non-tender  Hold (Positioning): Assistance needed to correctly position infant at breast and maintain latch.  LATCH Score: 5  Interventions Interventions: Breast feeding basics reviewed;Assisted with latch;Skin to skin;Breast massage;Position options;Support pillows;Adjust position;Breast compression  Lactation Tools Discussed/Used WIC Program: Yes   Consult Status Consult Status: Follow-up Date: 06/22/18 Follow-up type: In-patient    Christie Ali [name] 06/22/2018, 5:43 AM

## 2018-06-22 NOTE — Anesthesia Postprocedure Evaluation (Signed)
Anesthesia Post Note  Patient: Christie Ali  Procedure(s) Performed: AN AD [place]     Patient location during evaluation: Mother Baby Anesthesia Type: Epidural Level of consciousness: awake and alert Pain management: pain level controlled Vital Signs Assessment: post-procedure vital signs reviewed and stable Respiratory status: spontaneous breathing, nonlabored ventilation and respiratory function stable Cardiovascular status: stable Postop Assessment: no headache, no backache, epidural receding, able to ambulate, adequate PO intake, no apparent nausea or vomiting and patient able to bend at knees Anesthetic complications: no    Last Vitals:  Vitals:   06/22/18 0445 06/22/18 0545  BP: 132/85 138/81  Pulse: 82 82  Resp: 16 16  Temp: 36.8 C 36.7 C  SpO2:      Last Pain:  Vitals:   06/22/18 0715  TempSrc:   PainSc: Asleep   Pain Goal:                 Jabier Mutton

## 2018-06-23 NOTE — Lactation Note (Signed)
This note was copied from a baby's chart. Lactation Consultation Note; Mom pumping as I went into room. Reports baby has not fed since 4:30 am. Ped in finishing exam. Encouraged to feed at least q 3 hours or whenever showing feeding cues. Baby awake and fussy, offered assist with latch and mom agreeable. Latched to left breast- took a couple of attempts for baby to open wide but then latched well with lots of swallows noted. Nursed for 20 min - still nursing as I left room. Mom reports this is the first time she has latched to this breast. Encouragement given. Using her personal Lansinoh pump. No questions at present. To call for assist prn  Patient Name: Christie Ali VQQVZ'D Date: 06/23/2018 Reason for consult: Follow-up assessment   Maternal Data Formula Feeding for Exclusion: No Has patient been taught Hand Expression?: Yes Does the patient have breastfeeding experience prior to this delivery?: No  Feeding Feeding Type: Breast Fed  LATCH Score Latch: Grasps breast easily, tongue down, lips flanged, rhythmical sucking.  Audible Swallowing: Spontaneous and intermittent  Type of Nipple: Everted at rest and after stimulation  Comfort (Breast/Nipple): Soft / non-tender  Hold (Positioning): Assistance needed to correctly position infant at breast and maintain latch.  LATCH Score: 9  Interventions Interventions: Breast feeding basics reviewed;Assisted with latch;Coconut oil;Expressed milk  Lactation Tools Discussed/Used     Consult Status Consult Status: Follow-up Date: 06/24/18 Follow-up type: In-patient    Truddie Crumble 06/23/2018, 10:32 AM

## 2018-06-23 NOTE — Progress Notes (Addendum)
Patient ID: Christie Ali, female   DOB: 1986-11-25, 32 y.o.   MRN: [id number] POSTPARTUM PROGRESS NOTE  Post Partum Day #1 Subjective:  Christie Ali is a 32 y.o. G1P1001 [redacted]w[redacted]d s/p SVD at 0300 for IOL due to gHTN.  No acute events overnight.  Pt denies problems with ambulating, voiding or po intake.  She denies nausea or vomiting.  Pain is well controlled. Lochia Minimal.   Objective: Blood pressure 125/67, pulse 72, temperature 98.3 F (36.8 C), temperature source Oral, resp. rate 18, height 5\' 9"  (1.753 m), weight 100.7 kg (222 lb), last menstrual period 09/06/2017, SpO2 99 %, unknown if currently breastfeeding.  Physical Exam:  General: alert, cooperative and no distress Lochia:normal flow Chest: no respiratory distress Heart:regular rate, distal pulses intact Abdomen: soft, nontender,  Uterine Fundus: firm, appropriately tender DVT Evaluation: No calf swelling or tenderness Extremities: no edema  Recent Labs    06/21/18 0920  HGB 12.2  HCT 36.1    Assessment/Plan:  ASSESSMENT: Christie Ali is a 32 y.o. G1P1001 [redacted]w[redacted]d s/p SVD for IOL due to gHTN  Breastfeeding and Contraception Depo   LOS: 2 days   Sherene Sires, MS3 06/23/2018, 7:28 AM   RESIDENT ADDENDUM I have separately seen and examined the patient. I have discussed the findings and exam with the medical student and agree with the above note. I helped develop the management plan that is described in the student's note, and I agree with the content.  Mallie Snooks, CNM 06/23/18  2:24 PM

## 2018-06-23 NOTE — Clinical Social Work Maternal (Signed)
CLINICAL SOCIAL WORK MATERNAL/CHILD NOTE  Patient Details  Name: Christie Ali MRN: [id number] Date of Birth: 10/21/1986  Date:  Dec 18, 2018  Clinical Social Worker Initiating Note:  Christie Ali, [place] Date/Time: Initiated:  06/23/18/1110     Child's Name:  Christie Ali   Biological Parents:  Mother, Father(Christie Ali and Christie Ali)   Need for Interpreter:  None   Reason for Referral:  Current Substance Use/Substance Use During Pregnancy (Marijuana use)   Address:  [address] [place] [address]    Phone number:  [phone] (home)     Additional phone number:   Household Members/Support Persons (HM/SP):   Household Member/Support Person 1, Household Member/Support Person 2   HM/SP Name Relationship DOB or Age  HM/SP -[place] mother    HM/SP -2 Christie Ali father    HM/SP -3        HM/SP -4        HM/SP -5        HM/SP -6        HM/SP -7        HM/SP -8          Natural Supports (not living in the home):  Spouse/significant other, Extended Family, Immediate Family, Friends(Parents report having a great support system of family and friends)   Medical illustrator Supports:     Employment:     Type of Work:     Education:      Homebound arranged:    Museum/gallery curator Resources:  Medicaid   Other Resources:  [facility]   Cultural/Religious Considerations Which May Impact Care: None stated.  Strengths:  Ability to meet basic needs , Pediatrician chosen, Home prepared for child    Psychotropic Medications:         Pediatrician:    [facility]  Pediatrician List:   [facility] Other([facility])  [facility]      Pediatrician Fax Number:    Risk Factors/Current Problems:  Substance Use (Marijuana use)   Cognitive State:  Able to Concentrate , Alert , Linear Thinking , Insightful    Mood/Affect:      CSW Assessment: CSW met with MOB and FOB in MOB's first  floor room/142 to offer support and complete assessment due to marijuana use.  MOB was positive for THC on admission.  Baby's UDS not completed and CDS pending. Parents were pleasant and welcoming of CSW's visit.  FOB was quiet and MOB did most of the talking, but FOB was engaged.  Both parents report feeling excited about becoming parents.  They report they are in a relationship.  MOB states she stays with FOB (Christie Ali., [place], [place]) most of the time, but that she lives with her parents (address above).  She states they have everything they need for baby at both residences.  Parents were attentive while CSW provided SIDS education.   MOB reports no hx of mental illness.  Parents listened as CSW provided information about PMADs and the importance of monitoring symptoms and addressing basic needs.  CSW encouraged good communication between MOB and FOB.  MOB reports that she has friends who experienced PPD.  She states a commitment to talking with her provider if she has concerns at any time.  She reports no emotional concerns at this time. Parents were understanding of hospital drug screen policy and MOB stated, "I knew this was  coming."  She was open about her use and states she tried to quit, but smokes daily and states it was too difficult to quit.  She states she would be able to quit for a few weeks at a time, but then go back to it if she felt stressed or just wanted to.  She states her parents know about her use and don't approve.  She does not smoke in the house.  She assumes baby's CDS will be positive and was understanding and appreciative of the information from [place] regarding what to expect from CPS involvement.  CSW explained that this will not delay discharge.  CSW Plan/Description:  Perinatal Mood and Anxiety Disorder (PMADs) Education, Sudden Infant Death Syndrome (SIDS) Education, No Further Intervention Required/No Barriers to Discharge, CSW Will Continue to Monitor Umbilical Cord  Tissue Drug Screen Results and Make Report if Warranted, [place], [place], [place] 03-Jun-2018, 11:12 AM

## 2018-06-24 MED ORDER — IBUPROFEN 600 MG PO TABS: 600.0000 mg | ORAL_TABLET | Freq: Four times a day (QID) | ORAL | 0 refills | Status: DC

## 2018-06-24 NOTE — Lactation Note (Signed)
This note was copied from a baby's chart. Lactation Consultation Note  Patient Name: Christie Ali VANVB'T Date: 06/24/2018 Reason for consult: Follow-up assessment Baby is 47 hours old and at a 6% weight loss.  Mom reports that feedings are going well.  Breasts are feeling heavy.  Discussed milk coming to volume and prevention and treatment of engorgement.  Mom has a double electric breast pump for home use.  Lactation outpatient services and support reviewed and encouraged prn.  Maternal Data    Feeding Feeding Type: Breast Fed Length of feed: 5 min  LATCH Score                   Interventions    Lactation Tools Discussed/Used     Consult Status Consult Status: Complete Follow-up type: Call as needed    Ave Filter 06/24/2018, 11:49 AM

## 2018-06-24 NOTE — Discharge Summary (Signed)
OB Discharge Summary     Patient Name: Christie Ali DOB: 12-11-1986 MRN: [id number]  Date of admission: 06/21/2018 Delivering MD: Serita Grammes D   Date of discharge: 06/24/2018  Admitting diagnosis: 56WKS, PRE EVAL Intrauterine pregnancy: [redacted]w[redacted]d     Secondary diagnosis:  Active Problems:   Gestational hypertension   Gestational hypertension w/o significant proteinuria in 3rd trimester  Additional problems: GBS carrier, tobacco use, marijuana use     Discharge diagnosis: Term Pregnancy Delivered and Gestational Hypertension                                                                                                Post partum procedures:n/a  Augmentation: AROM, Pitocin and Foley Balloon  Complications: None  Hospital course:  Induction of Labor With Vaginal Delivery   32 y.o. yo G1P1001 at [redacted]w[redacted]d was admitted to the hospital 06/21/2018 for induction of labor.  Indication for induction: Gestational hypertension.  Patient had an uncomplicated labor course as follows: Membrane Rupture Time/Date: 6:41 PM ,06/21/2018   Intrapartum Procedures: Episiotomy: None [1]                                         Lacerations:  None [1]  Patient had delivery of a Viable infant.  Information for the patient's newborn:  Lakeria, [place] [[id number]]  Delivery Method: Vag-Spont   06/22/2018  Details of delivery can be found in separate delivery note.  Patient had a routine postpartum course. Patient is discharged home 06/24/18.  Physical exam  Vitals:   06/23/18 1443 06/23/18 1455 06/23/18 2301 06/24/18 0541  BP: (!) 133/96 132/88 (!) 140/91 122/85  Pulse: 67 67 75 68  Resp: 16  18 18   Temp:   98.4 F (36.9 C) 97.7 F (36.5 C)  TempSrc:   Oral Oral  SpO2:  100% 100% 98%  Weight:      Height:       General: alert, cooperative and no distress Lochia: appropriate Uterine Fundus: firm Incision: N/A DVT Evaluation: No evidence of DVT seen on physical exam. Negative Homan's  sign. No cords or calf tenderness. No significant calf/ankle edema. Labs: Lab Results  Component Value Date   WBC 16.5 (H) 06/21/2018   HGB 12.2 06/21/2018   HCT 36.1 06/21/2018   MCV 95.3 06/21/2018   PLT 222 06/21/2018   CMP Latest Ref Rng & Units 06/21/2018  Glucose 70 - 99 mg/dL 86  BUN 6 - 20 mg/dL 7  Creatinine 0.44 - 1.00 mg/dL 0.78  Sodium 135 - 145 mmol/L 135  Potassium 3.5 - 5.1 mmol/L 3.9  Chloride 98 - 111 mmol/L 105  CO2 22 - 32 mmol/L 21(L)  Calcium 8.9 - 10.3 mg/dL 8.9  Total Protein 6.5 - 8.1 g/dL 6.4(L)  Total Bilirubin 0.3 - 1.2 mg/dL 0.5  Alkaline Phos 38 - 126 U/L 202(H)  AST 15 - 41 U/L 23  ALT 0 - 44 U/L 12    Discharge instruction: per After Visit Summary and "Baby  and Me Booklet".  After visit meds:  Allergies as of 06/24/2018      Reactions   Sulfa Antibiotics Hives   Other Rash   Nickel       Medication List    TAKE these medications   acetaminophen 500 MG tablet Commonly known as:  TYLENOL Take 1,000 mg by mouth every 6 (six) hours as needed for mild pain or headache.   ibuprofen 600 MG tablet Commonly known as:  ADVIL,MOTRIN Take 1 tablet (600 mg total) by mouth every 6 (six) hours.   multivitamin-prenatal 27-0.8 MG Tabs tablet Take 1 tablet by mouth daily at 12 noon.   nicotine 14 mg/24hr patch Commonly known as:  NICODERM CQ - dosed in mg/24 hours Place 1 patch (14 mg total) onto the skin daily.   valACYclovir 500 MG tablet Commonly known as:  VALTREX Take 1 tablet (500 mg total) by mouth 2 (two) times daily.       Diet: routine diet  Activity: Advance as tolerated. Pelvic rest for 6 weeks.   Outpatient follow up:6 weeks Follow up Appt:baby love BP check in 1 week Follow up Visit:No follow-ups on file.  Postpartum contraception: Depo Provera  Newborn Data: Live born female  Birth Weight: 7 lb 10.4 oz (3470 g) APGAR: 7, 9  Newborn Delivery   Birth date/time:  06/22/2018 02:56:00 Delivery type:  Vaginal,  Spontaneous     Baby Feeding: Breast Disposition:home with mother   06/24/2018 Julianne Handler, CNM

## 2018-06-26 ENCOUNTER — Encounter: Payer: Self-pay | Admitting: *Deleted

## 2018-06-27 ENCOUNTER — Other Ambulatory Visit: Payer: Medicaid Other

## 2018-06-27 ENCOUNTER — Encounter: Payer: Medicaid Other | Admitting: Student

## 2018-06-29 ENCOUNTER — Inpatient Hospital Stay (HOSPITAL_COMMUNITY): Admission: RE | Admit: 2018-06-29 | Payer: Medicaid Other | Source: Ambulatory Visit

## 2018-06-29 ENCOUNTER — Ambulatory Visit (INDEPENDENT_AMBULATORY_CARE_PROVIDER_SITE_OTHER): Payer: Medicaid Other | Admitting: *Deleted

## 2018-06-29 VITALS — BP 141/94 | HR 91 | Wt 194.4 lb

## 2018-06-29 DIAGNOSIS — Z0130 Encounter for examination of blood pressure without abnormal findings: Secondary | ICD-10-CM

## 2018-06-29 NOTE — Progress Notes (Signed)
Here for bp check. Vag del 06/22/18 after IOL for GHTN. No edema noted today. Denies headaches. States feels fine.  Reviewed chart, today's blood pressures and assessment with Dr. Harolyn Rutherford. No orders. Instructed patient to keep postpartum appointment as scheduled. Come to mau if she has severe headaches, or sudden edema. She voices understanding.

## 2018-06-29 NOTE — Progress Notes (Signed)
I have reviewed the chart and agree with nursing staff's documentation of this patient's encounter.  Verita Schneiders, MD 06/29/2018 12:16 PM

## 2018-08-02 ENCOUNTER — Encounter: Payer: Self-pay | Admitting: Medical

## 2018-08-02 ENCOUNTER — Ambulatory Visit (INDEPENDENT_AMBULATORY_CARE_PROVIDER_SITE_OTHER): Payer: Medicaid Other | Admitting: Medical

## 2018-08-02 DIAGNOSIS — Z3202 Encounter for pregnancy test, result negative: Secondary | ICD-10-CM

## 2018-08-02 DIAGNOSIS — Z8759 Personal history of other complications of pregnancy, childbirth and the puerperium: Secondary | ICD-10-CM

## 2018-08-02 DIAGNOSIS — Z1389 Encounter for screening for other disorder: Secondary | ICD-10-CM | POA: Diagnosis not present

## 2018-08-02 LAB — POCT PREGNANCY, URINE: Preg Test, Ur: NEGATIVE

## 2018-08-02 NOTE — Progress Notes (Signed)
Subjective:    Christie Ali is a 32 y.o. female who presents for a postpartum visit. She is 6 weeks postpartum following a spontaneous vaginal delivery. I have fully reviewed the prenatal and intrapartum course. The delivery was at 40 gestational weeks. Outcome: spontaneous vaginal delivery. Anesthesia: epidural. Postpartum course has been normal. Baby's course has been normal. Baby is feeding by both breast and bottle - Enfamil with Iron. Bleeding no bleeding. Bowel function is normal. Bladder function is normal. Patient is sexually active.   Last unprotected intercourse yesterday. Contraception method is none. Postpartum depression screening: negative.  The following portions of the patient's history were reviewed and updated as appropriate: allergies, current medications, past family history, past medical history, past social history, past surgical history and problem list.  Review of Systems Pertinent items are noted in HPI.   Objective:    BP 113/75   Pulse 61   Ht 5\' 9"  (1.753 m)   Wt 181 lb 4.8 oz (82.2 kg)   Breastfeeding? Yes   BMI 26.77 kg/m    General:  alert and cooperative   Breasts:  not performed  Lungs: clear to auscultation bilaterally  Heart:  regular rate and rhythm, S1, S2 normal, no murmur, click, rub or gallop  Abdomen: soft, non-tender; bowel sounds normal; no masses,  no organomegaly   Vulva:  not evaluated  Vagina: not evaluated  Cervix:  not evaluated  Corpus: not examined  Adnexa:  not evaluated  Rectal Exam: Not performed.        Assessment:     Normal postpartum exam. Pap smear not done at today's visit. Last pap was normal 2018 at [facility].   Plan:    1. Contraception: condoms x 2 weeks and then will start Depo Provera if UPT still negative 2. Follow up in: 2 weeks for UPT and Depo Provera initiation or sooner as needed.    Luvenia Redden, PA-C 08/02/2018 10:03 AM

## 2018-08-02 NOTE — Patient Instructions (Signed)

## 2018-08-16 ENCOUNTER — Ambulatory Visit: Payer: Medicaid Other

## 2018-11-09 IMAGING — US US EXTREM UP *R* LTD
1 series · 6 of 6 positions shown · non-contrast
Comparison: None

CLINICAL DATA: Enlarging right axillary lump

EXAM:
ULTRASOUND right UPPER EXTREMITY LIMITED
TECHNIQUE: Ultrasound examination of the upper extremity soft tissues was
performed in the area of clinical concern.

[Series 1: us extrem up *right* ltd · 0.07mm/px · 6 of 6 slices shown]
[im 1/6]
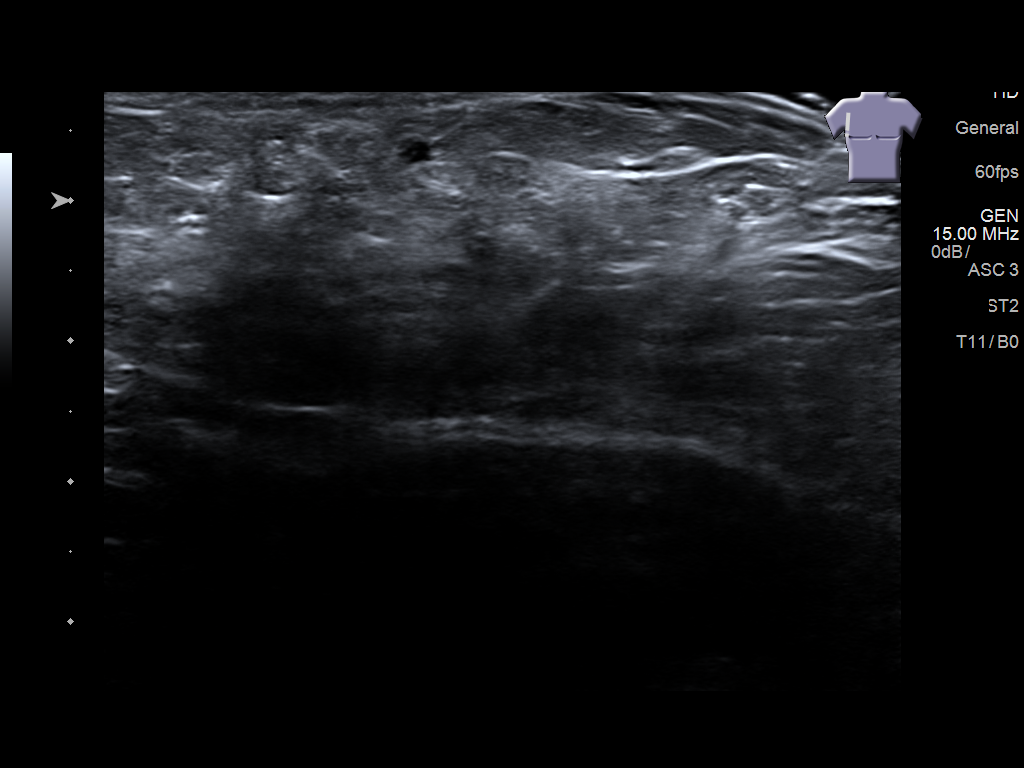
[im 2/6]
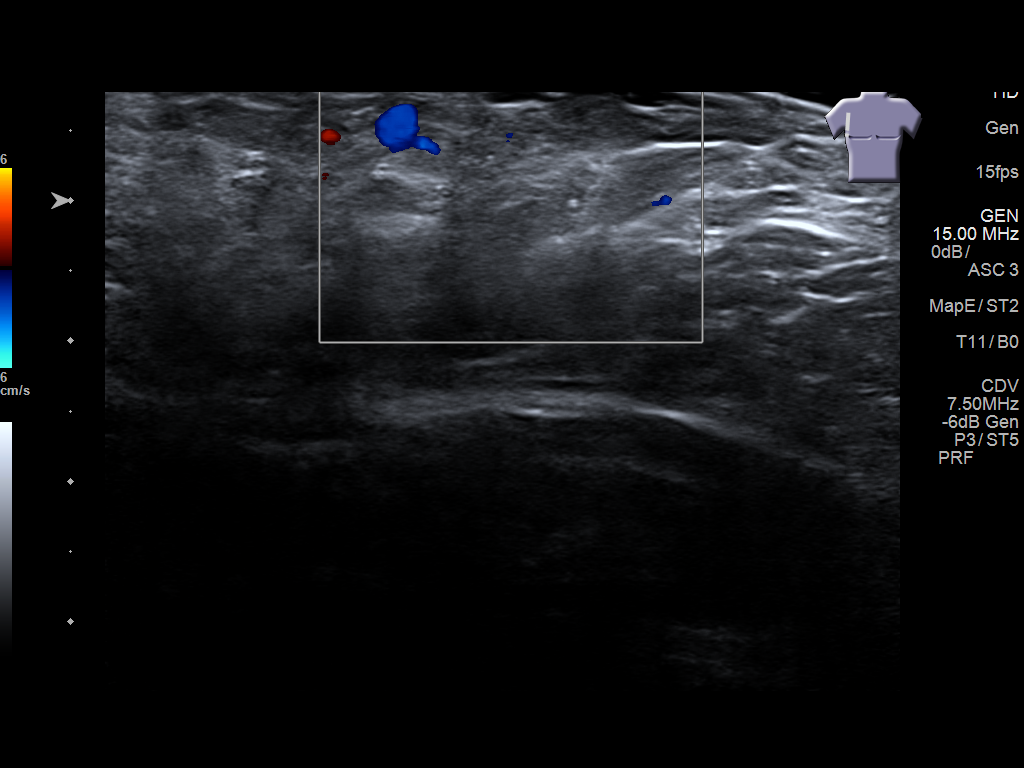
[im 3/6]
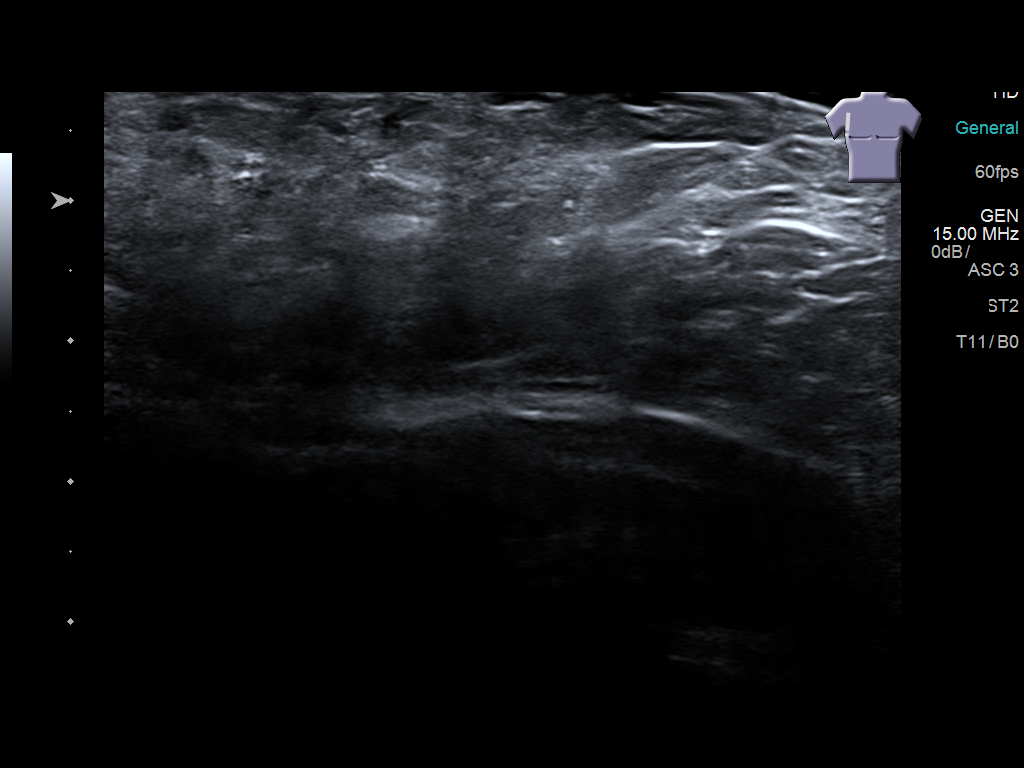
[im 4/6]
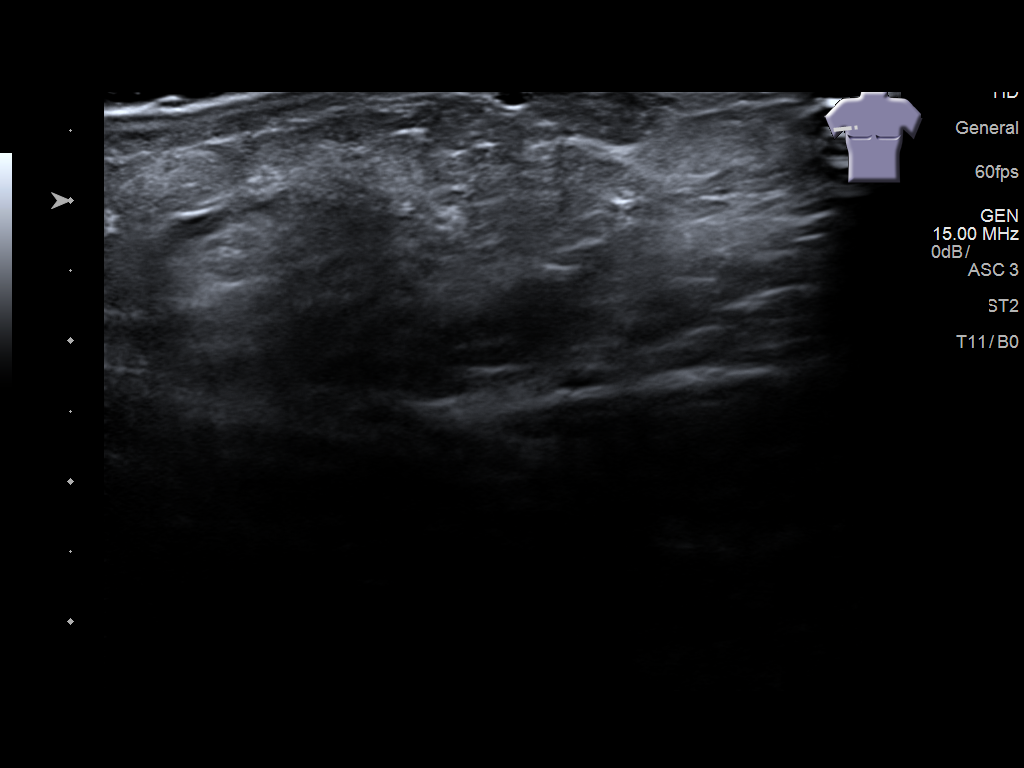
[im 5/6]
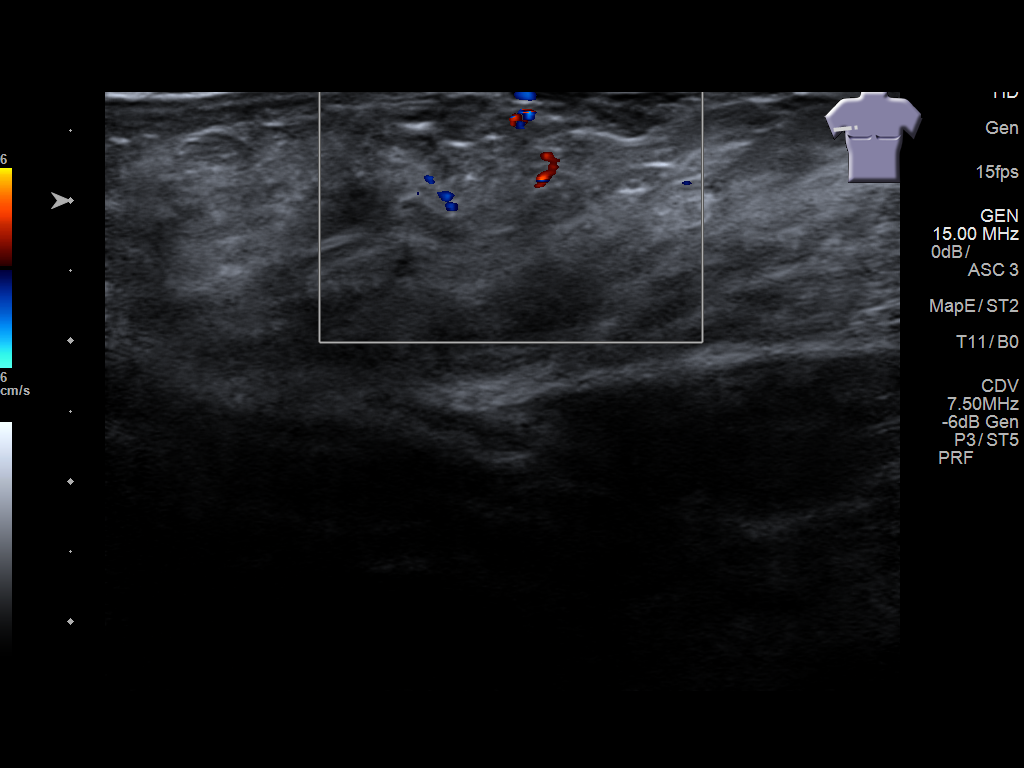
[im 6/6]
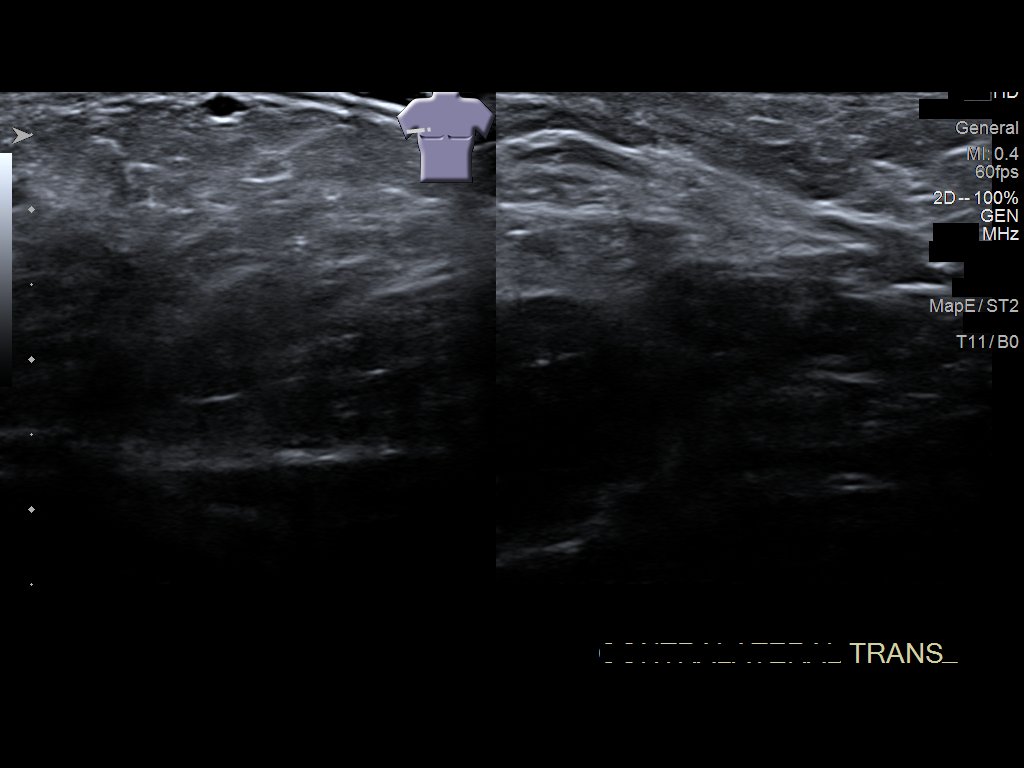

[6 of 6 positions shown; findings below may reference images not displayed]

FINDINGS: Targeted ultrasound of the right axilla performed in the region of
palpable mass. Slight asymmetric fullness of the soft tissues
compare to contralateral left axilla but no well-defined mass or
cyst.
IMPRESSION: No circumscribed mass or fluid collection in the region of right
axillary lump. Further management will need to be based on clinical
grounds.

## 2018-11-09 IMAGING — US US OB COMP LESS 14 WK
1 series · 14 of 28 positions shown · non-contrast
Comparison: Prior ultrasound from 10/26/2017.

CLINICAL DATA: Initial evaluation for acute pelvic pain, vaginal
bleeding. Early pregnancy.

EXAM:
OBSTETRIC <14 WK US AND TRANSVAGINAL OB US
TECHNIQUE: Both transabdominal and transvaginal ultrasound examinations were
performed for complete evaluation of the gestation as well as the
maternal uterus, adnexal regions, and pelvic cul-de-sac.
Transvaginal technique was performed to assess early pregnancy.

[Series 1: us ob comp less 14 wk · 0.18mm/px · 14 of 84 slices shown]
[im 4/84]
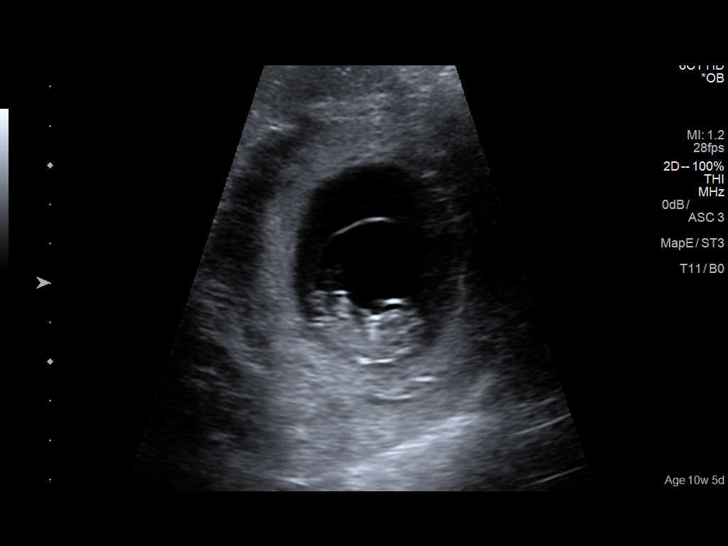
[im 10/84]
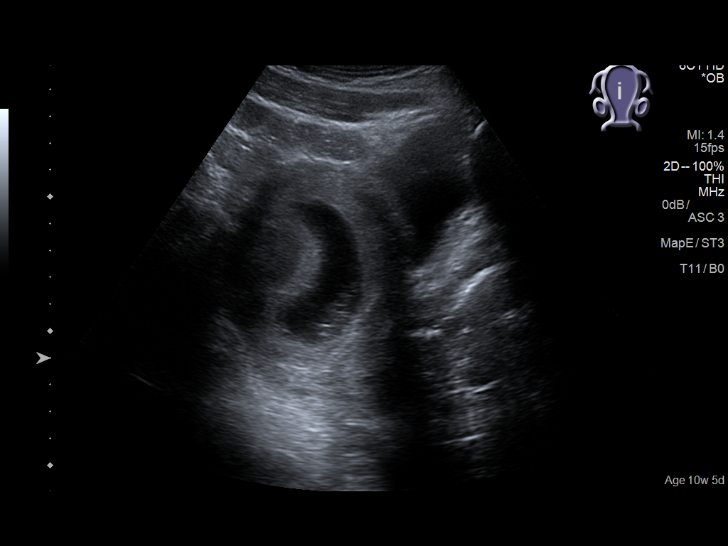
[im 16/84]
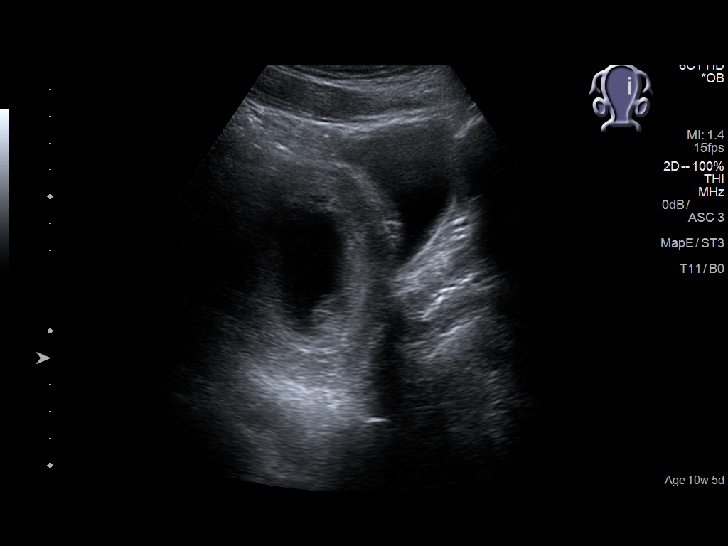
[im 22/84]
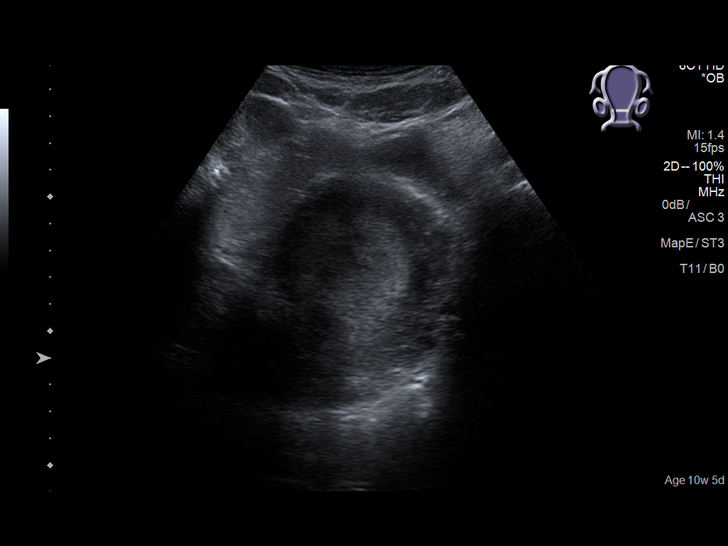
[im 28/84]
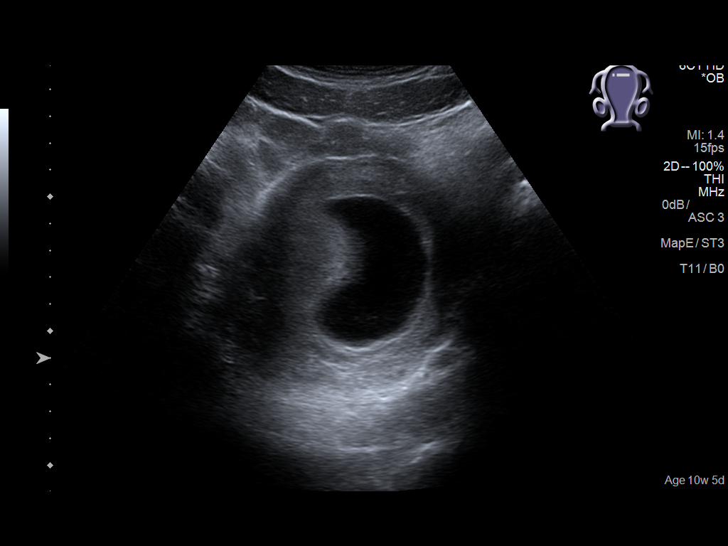
[im 34/84]
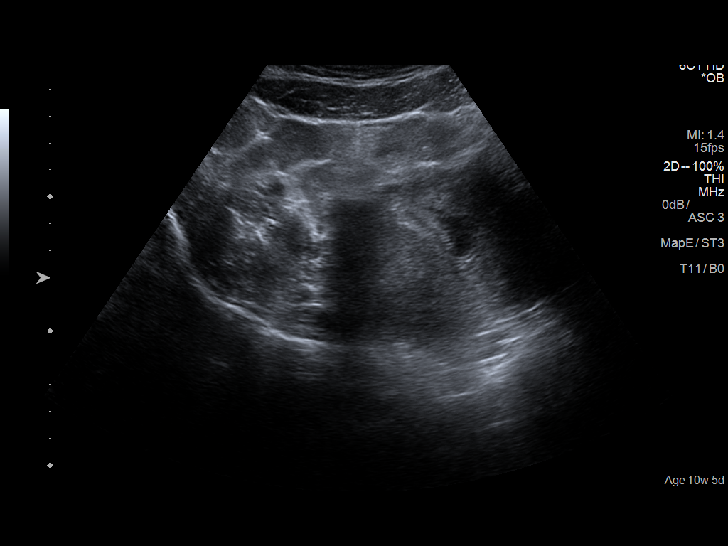
[im 40/84]
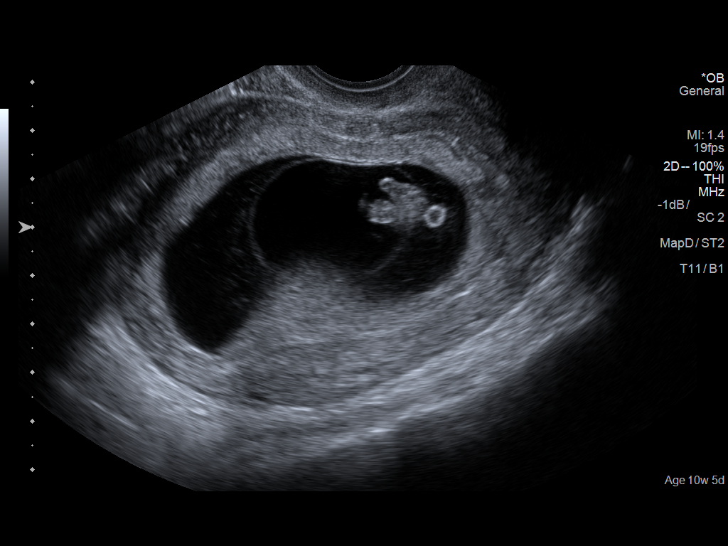
[im 47/84]
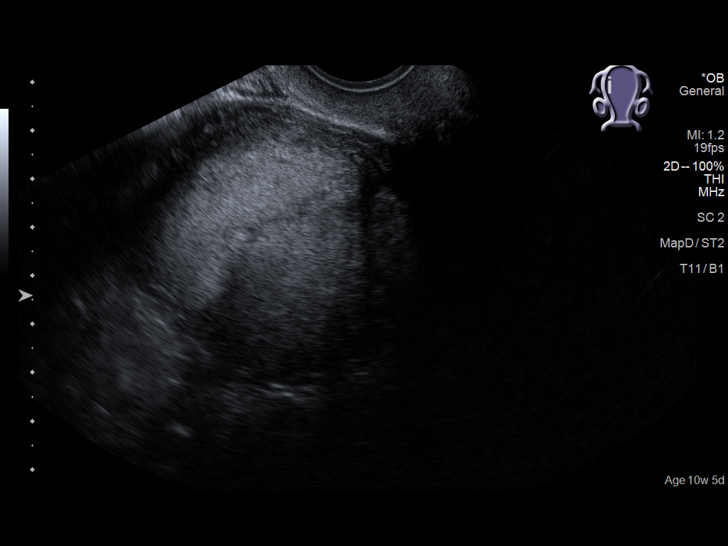
[im 53/84]
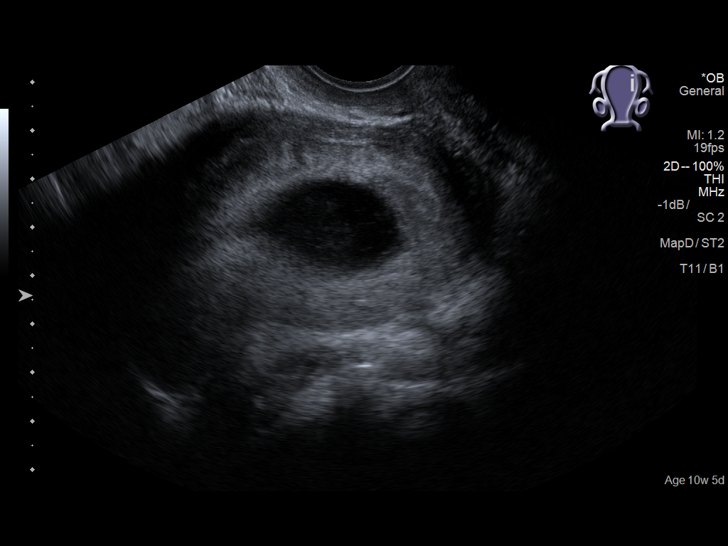
[im 59/84]
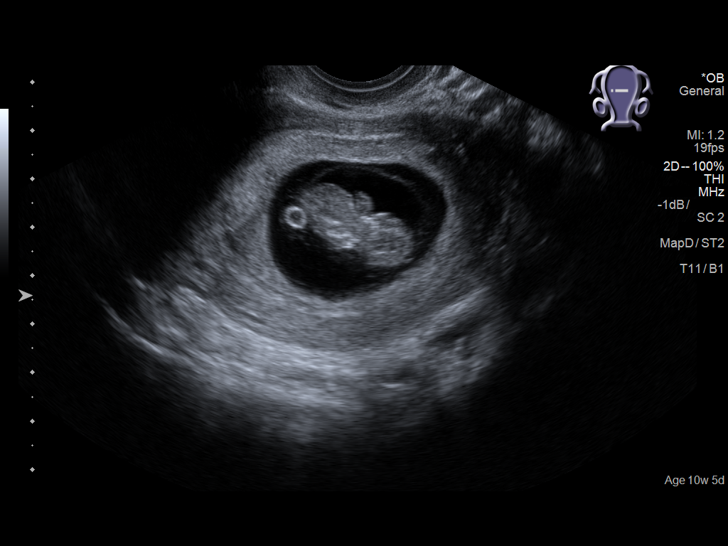
[im 65/84]
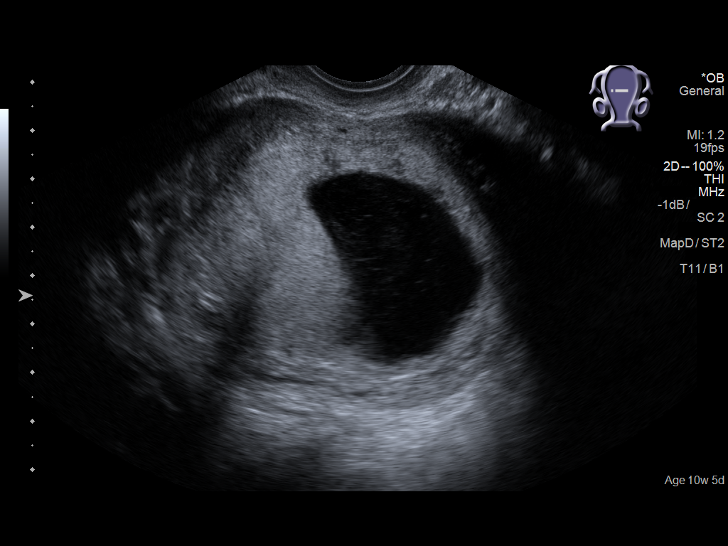
[im 71/84]
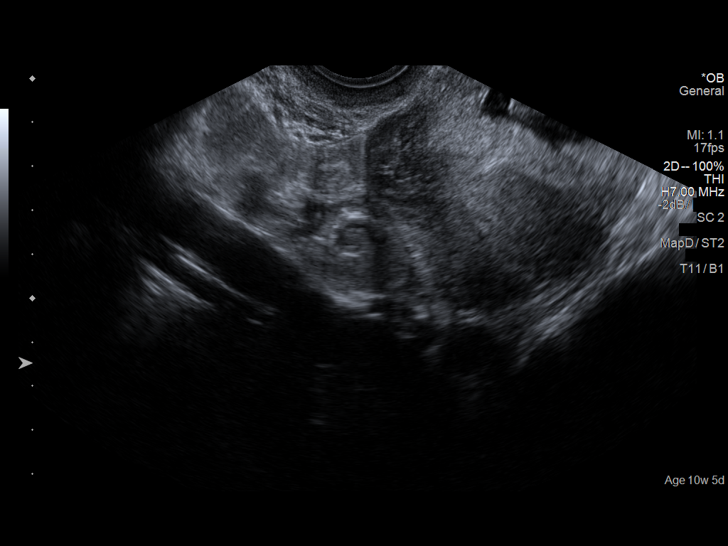
[im 77/84]
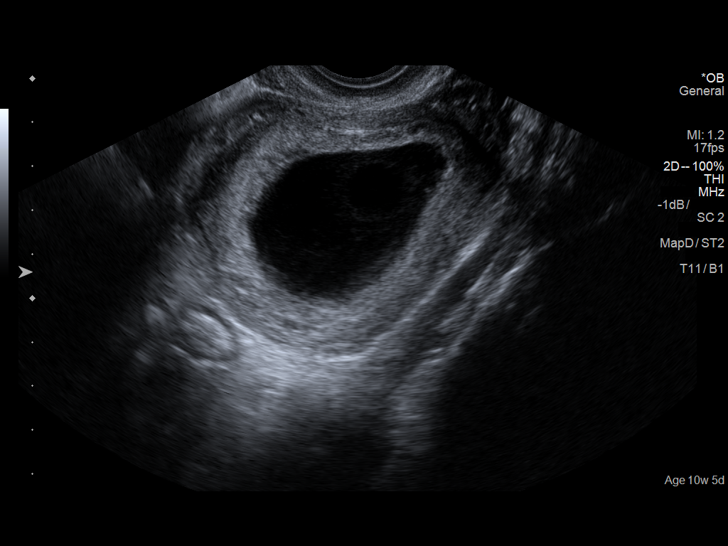
[im 84/84]
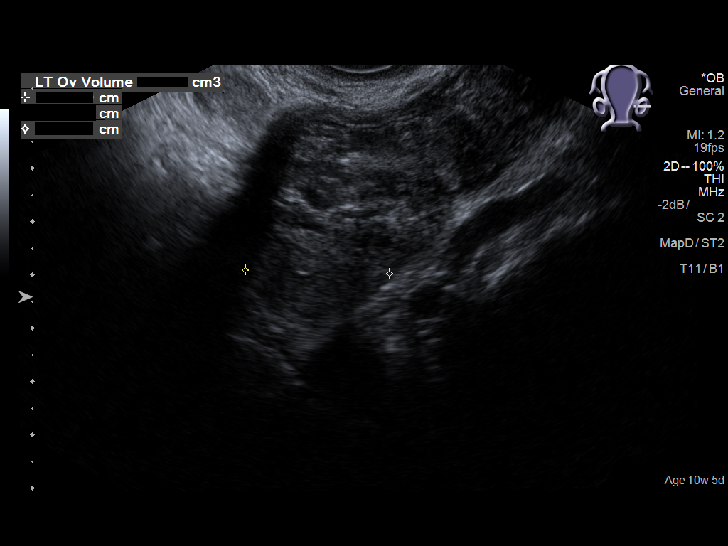

[14 of 28 positions shown; findings below may reference images not displayed]

FINDINGS: Intrauterine gestational sac: Single

Yolk sac:  Present

Embryo:  Present

Cardiac Activity: Present

Heart Rate: 169  bpm

CRL:  29.3  mm   9 w   5 d                  US EDC: 06/20/2018

Subchorionic hemorrhage:  None visualized.

Maternal uterus/adnexae: Ovaries normal in appearance bilaterally.
No adnexal mass. No free fluid.
IMPRESSION: 1. Single live intrauterine pregnancy as above, estimated
gestational age 9 weeks and 5 days by crown-rump length. No
complication.
2. No other acute maternal uterine or adnexal abnormality
identified.

## 2018-12-20 NOTE — L&D Delivery Note (Addendum)
Delivery Note At 8:29 AM a viable female was delivered via Vaginal, Spontaneous (Presentation: LOA).  APGAR: 8,9 ; weight pending .   Placenta status: spontaneous and in tact  Cord:  3 vessel with the following complications: tight nuchal X 1 and body X 1.   With delivery of head, tight nuchal cord palpated, unable to reduce. No movement with next maternal push and shoulder dystocia identified. Patient laid flat and McRoberts done. Able to palpate posterior axilla and infant delivered with delivery of posterior arm. Delivered by [facility].  Anesthesia:  Epidural  Episiotomy: None Lacerations: Periurethral Suture Repair: n/a Est. Blood Loss (mL): 190  Mom to postpartum.  Baby to Couplet care / Skin to Skin.  Lattie Haw MD PGY-1, [facility]  08/25/2019, 8:57 AM  Patient is a 33 y.o. at [redacted]w[redacted]d who was admitted for SROM, significant hx of HSV.  She progressed with augmentation via pitocin.  I was gloved and present for delivery in its entirety.  After delivery of head, shoulder dystocia identified and CNM took over delivery. Resolved with McRoberts and delivery of posterior arm. Infant initially had poor tone and low HR, cord clamped and cut by CNM but resolved with vigorous stimulation by RNs.  Complications: shoulder dystocia  Lacerations: periurethral, hemostatic  Wende Mott, CNM 11:54 AM

## 2018-12-22 ENCOUNTER — Ambulatory Visit: Payer: Medicaid Other

## 2019-01-05 IMAGING — US US OB LIMITED
1 series · 14 of 28 positions shown · non-contrast
Comparison: none

CLINICAL DATA: Left pelvic pain for 2 days. No bleeding. By first
06/22/2018.

EXAM:
LIMITED OBSTETRIC ULTRASOUND

[Series 1: us ob limited · 0.26mm/px · 14 of 38 slices shown]
[im 2/38]
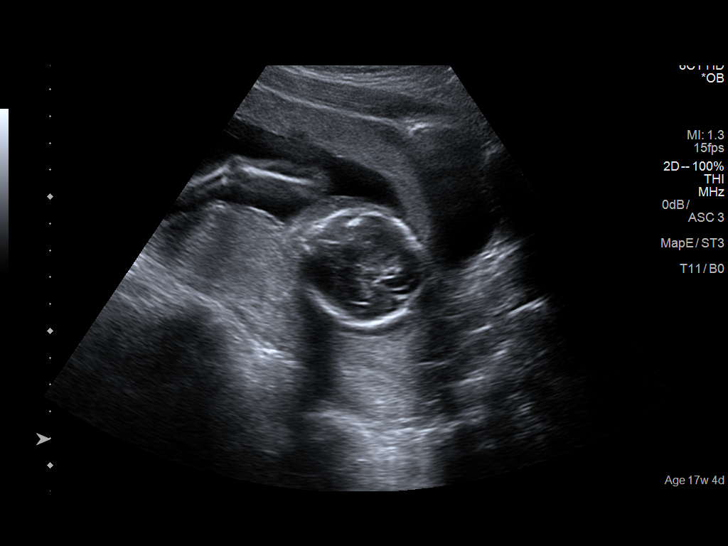
[im 5/38]
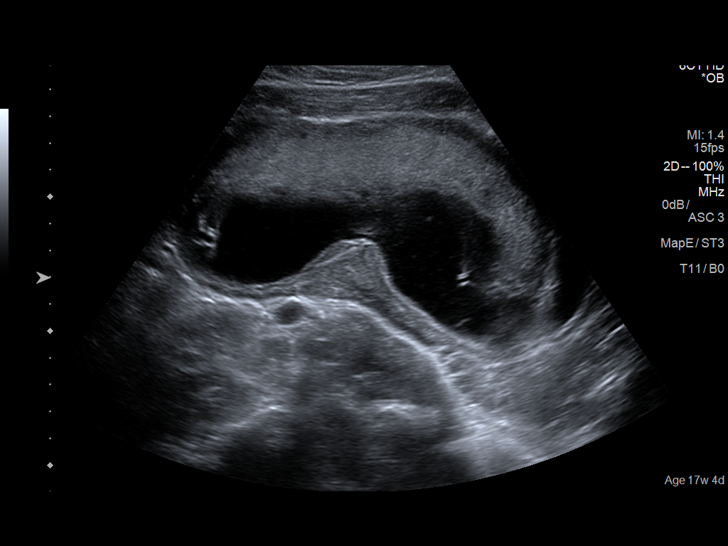
[im 7/38]
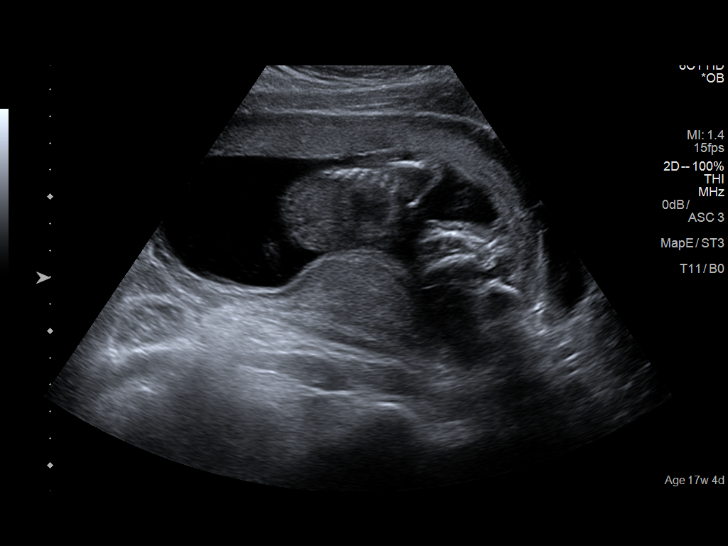
[im 10/38]
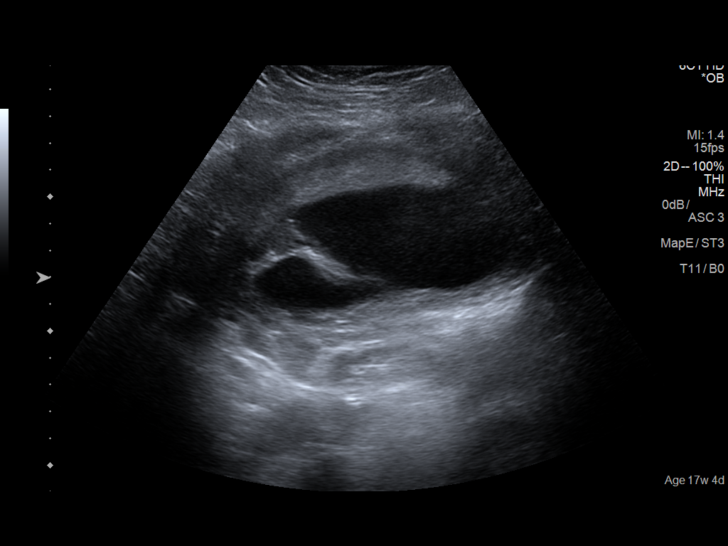
[im 13/38]
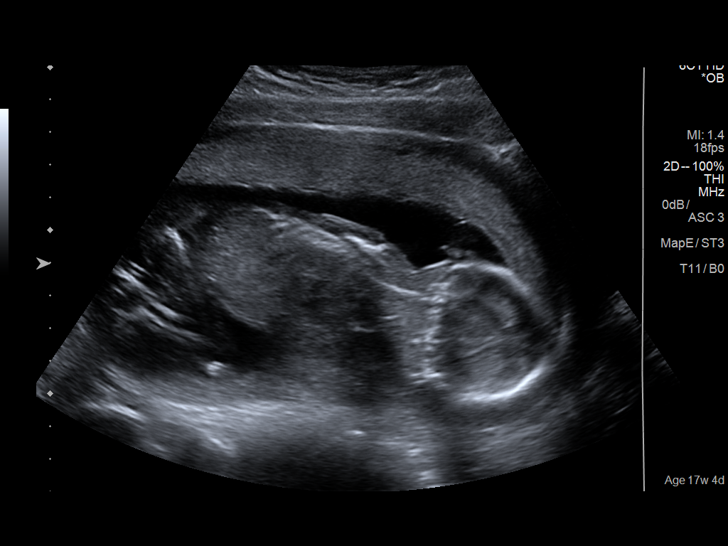
[im 16/38]
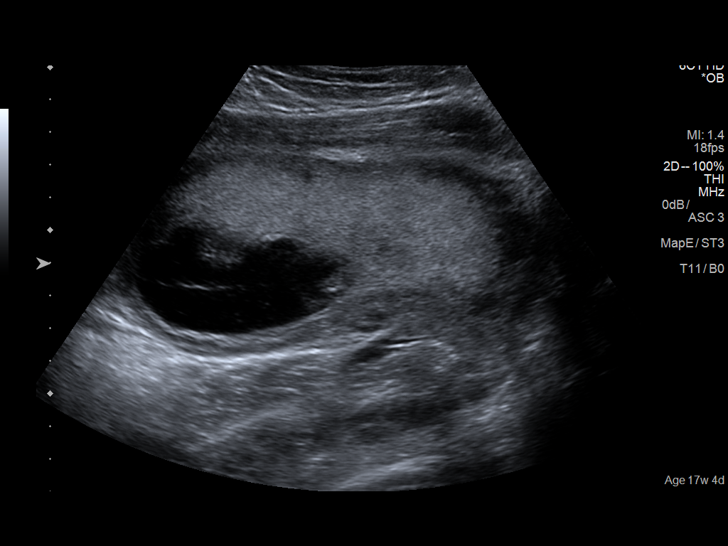
[im 18/38]
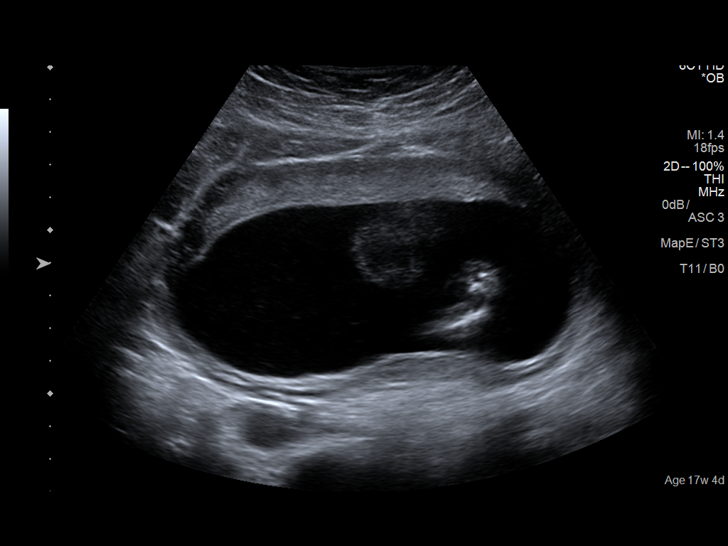
[im 21/38]
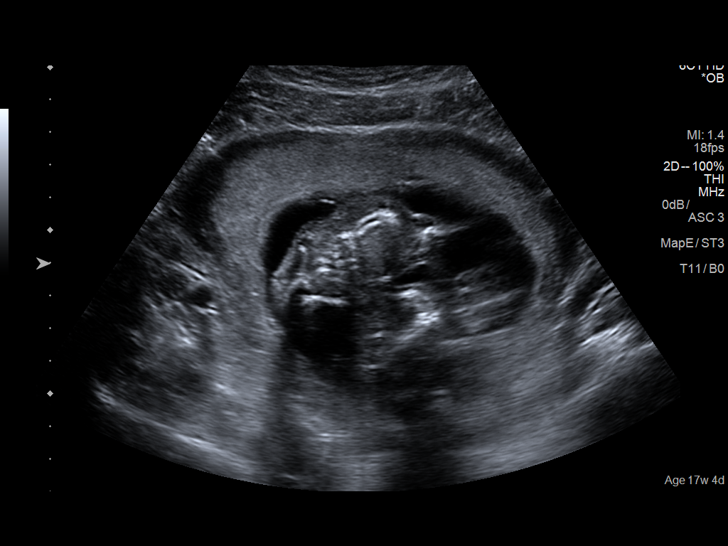
[im 24/38]
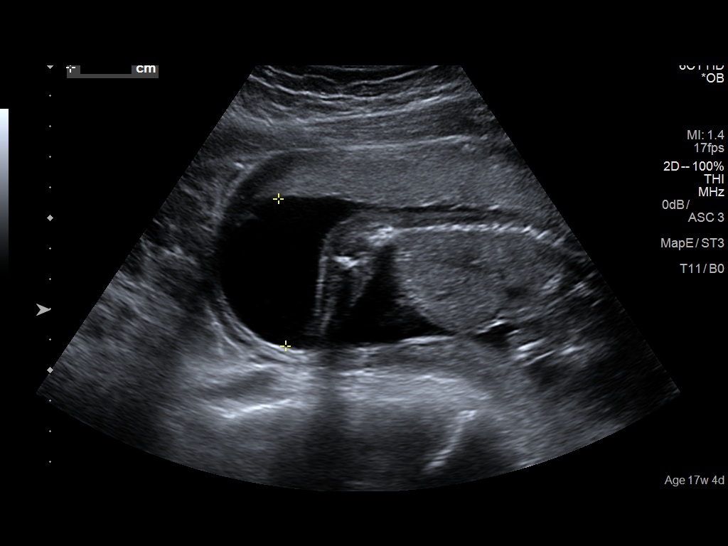
[im 27/38]
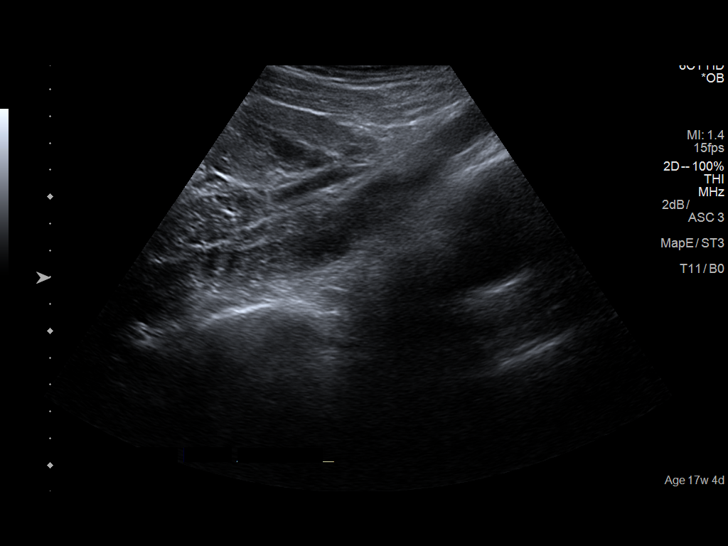
[im 29/38]
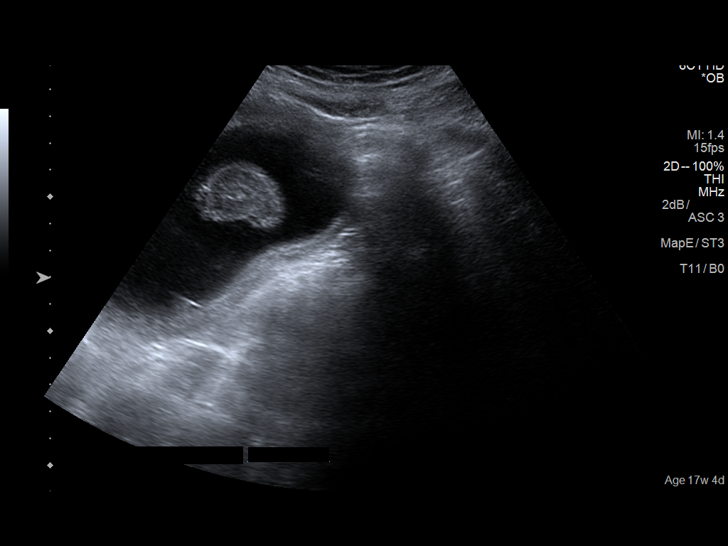
[im 32/38]
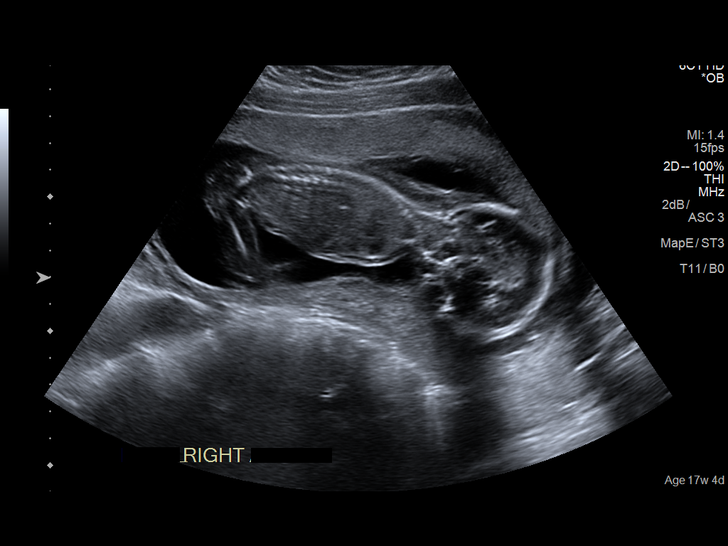
[im 35/38]
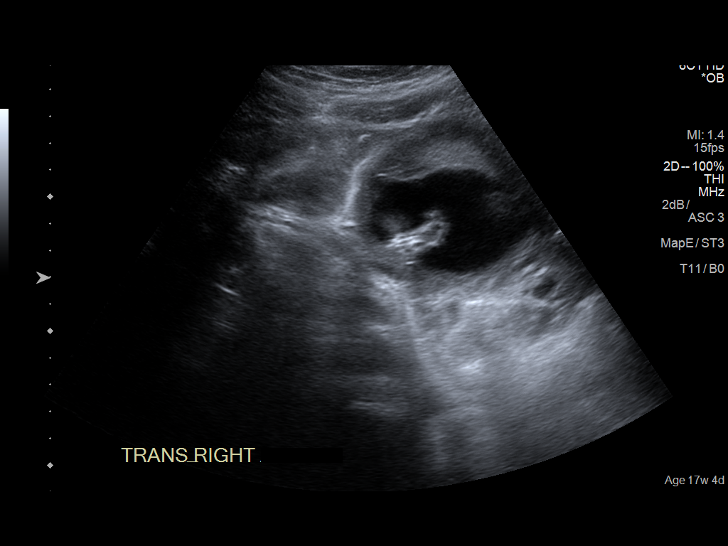
[im 38/38]
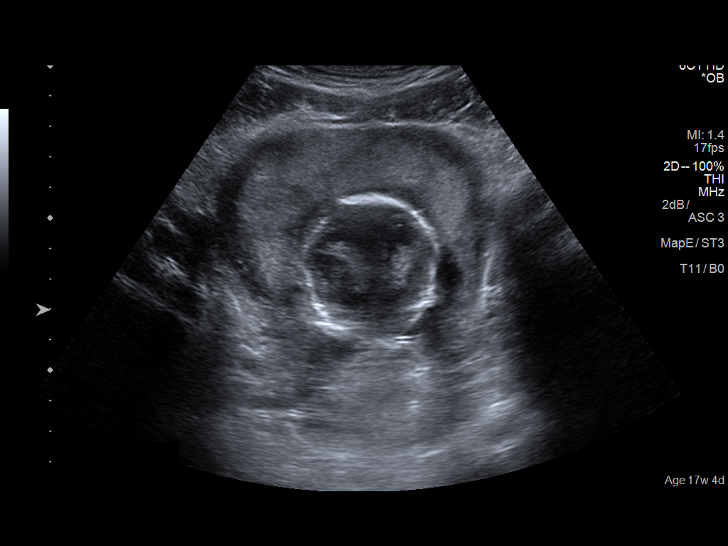

[14 of 28 positions shown; findings below may reference images not displayed]

FINDINGS: Number of Fetuses: 1

Heart Rate:  149 bpm

Movement: Present

Presentation: Breech

Placental Location: Anterior

Previa: None

Amniotic Fluid (Subjective):  Normal

BPD:  2.6cm 17w 5d

MATERNAL FINDINGS:

Cervix:  3.9 centimeters

Uterus/Adnexae: Normal in appearance.
IMPRESSION: 1. Single living intrauterine fetus in breech presentation.
2. Limited biometry correlates well with clinical dating.
3. Normal appearance of the placenta.  Normal amniotic fluid volume.

This exam is performed on an emergent basis and does not
comprehensively evaluate fetal size, dating, or anatomy; follow-up
complete OB US should be considered if further fetal assessment is
warranted.

## 2019-01-30 ENCOUNTER — Ambulatory Visit: Payer: Medicaid Other

## 2019-01-31 ENCOUNTER — Ambulatory Visit (INDEPENDENT_AMBULATORY_CARE_PROVIDER_SITE_OTHER): Payer: Medicaid Other | Admitting: General Practice

## 2019-01-31 ENCOUNTER — Encounter: Payer: Self-pay | Admitting: Family Medicine

## 2019-01-31 DIAGNOSIS — O219 Vomiting of pregnancy, unspecified: Secondary | ICD-10-CM

## 2019-01-31 DIAGNOSIS — Z3201 Encounter for pregnancy test, result positive: Secondary | ICD-10-CM | POA: Diagnosis not present

## 2019-01-31 DIAGNOSIS — Z3687 Encounter for antenatal screening for uncertain dates: Secondary | ICD-10-CM

## 2019-01-31 LAB — POCT PREGNANCY, URINE: Preg Test, Ur: POSITIVE — AB

## 2019-01-31 MED ORDER — PROMETHAZINE HCL 25 MG PO TABS: 25.0000 mg | ORAL_TABLET | Freq: Four times a day (QID) | ORAL | 0 refills | Status: DC | PRN

## 2019-01-31 NOTE — Progress Notes (Signed)
I have reviewed the chart and agree with nursing staff's documentation of this patient's encounter.  Kerry Hough, PA-C 01/31/2019 10:09 AM

## 2019-01-31 NOTE — Progress Notes (Signed)
Patient presents to office today for UPT. UPT +. Patient reports first positive home test in December. Patient reports LMP last week of November. Will schedule ultrasound to confirm dating- scheduled 2/18. Patient denies taking any medications only PNV. Patient reports having a lot of nausea especially at night time- desires Rx for nausea. Phenergan sent per protocol and also discussed she may try OTC pepcid for potential heartburn/acid reflux causing symptoms. Advised she begin prenatal care. Patient verbalized understanding & had no questions.  Koren Bound RN BSN 01/31/19

## 2019-02-06 ENCOUNTER — Other Ambulatory Visit: Payer: Self-pay | Admitting: Medical

## 2019-02-06 ENCOUNTER — Ambulatory Visit (INDEPENDENT_AMBULATORY_CARE_PROVIDER_SITE_OTHER): Payer: Medicaid Other | Admitting: General Practice

## 2019-02-06 ENCOUNTER — Ambulatory Visit (HOSPITAL_COMMUNITY)
Admission: RE | Admit: 2019-02-06 | Discharge: 2019-02-06 | Disposition: A | Discharge status: Home / Self Care | Payer: Medicaid Other | Source: Ambulatory Visit | Attending: Medical | Admitting: Medical

## 2019-02-06 DIAGNOSIS — Z3687 Encounter for antenatal screening for uncertain dates: Secondary | ICD-10-CM | POA: Insufficient documentation

## 2019-02-06 DIAGNOSIS — Z3A12 12 weeks gestation of pregnancy: Secondary | ICD-10-CM | POA: Diagnosis not present

## 2019-02-06 DIAGNOSIS — O26841 Uterine size-date discrepancy, first trimester: Secondary | ICD-10-CM | POA: Diagnosis not present

## 2019-02-06 DIAGNOSIS — Z712 Person consulting for explanation of examination or test findings: Secondary | ICD-10-CM

## 2019-02-06 NOTE — Progress Notes (Signed)
Patient presents to office today for viability ultrasound results. Reviewed with Dr Hulan Fray who finds living IUP, patient should begin prenatal care soon as she is already 12 weeks.  Informed patient of results & reviewed dating. Advised she begin OB care. Patient verbalized understanding & had no questions.  Koren Bound RN BSN 02/06/19

## 2019-02-09 NOTE — Progress Notes (Signed)
I have reviewed this chart and agree with the RN/CMA assessment and management.    Hollie Wojahn C Markeda Narvaez, MD, FACOG Attending Physician, [facility]  

## 2019-03-09 ENCOUNTER — Other Ambulatory Visit (HOSPITAL_COMMUNITY)
Admission: RE | Admit: 2019-03-09 | Discharge: 2019-03-09 | Disposition: A | Discharge status: Home / Self Care | Payer: Medicaid Other | Source: Ambulatory Visit | Attending: Obstetrics & Gynecology | Admitting: Obstetrics & Gynecology

## 2019-03-09 ENCOUNTER — Other Ambulatory Visit: Payer: Self-pay

## 2019-03-09 ENCOUNTER — Ambulatory Visit (INDEPENDENT_AMBULATORY_CARE_PROVIDER_SITE_OTHER): Payer: Medicaid Other | Admitting: *Deleted

## 2019-03-09 ENCOUNTER — Encounter: Payer: Self-pay | Admitting: *Deleted

## 2019-03-09 VITALS — BP 109/62 | HR 65 | Wt 179.6 lb

## 2019-03-09 DIAGNOSIS — O0990 Supervision of high risk pregnancy, unspecified, unspecified trimester: Secondary | ICD-10-CM | POA: Diagnosis not present

## 2019-03-09 DIAGNOSIS — O09899 Supervision of other high risk pregnancies, unspecified trimester: Secondary | ICD-10-CM | POA: Insufficient documentation

## 2019-03-09 DIAGNOSIS — O09299 Supervision of pregnancy with other poor reproductive or obstetric history, unspecified trimester: Secondary | ICD-10-CM

## 2019-03-09 DIAGNOSIS — Z8759 Personal history of other complications of pregnancy, childbirth and the puerperium: Secondary | ICD-10-CM

## 2019-03-09 LAB — POCT URINALYSIS DIP (DEVICE)
Bilirubin Urine: NEGATIVE
GLUCOSE, UA: NEGATIVE mg/dL
Hgb urine dipstick: NEGATIVE
Ketones, ur: NEGATIVE mg/dL
Leukocytes,Ua: NEGATIVE
NITRITE: NEGATIVE
PROTEIN: NEGATIVE mg/dL
SPECIFIC GRAVITY, URINE: 1.025 (ref 1.005–1.030)
UROBILINOGEN UA: 0.2 mg/dL (ref 0.0–1.0)
pH: 7 (ref 5.0–8.0)

## 2019-03-09 NOTE — Progress Notes (Signed)
New Ob intake completed and pregnancy information packet given. Medicaid Home form completed. [place] for anatomy scheduled. Pt states she currently uses marijuana because she has no appetite. She denies current nausea or vomiting. She was advised to eat small amounts of food several times daily and to decrease/stop marijuana use. Pt had GHTN with previous pregnancy within last year. Last Pap unknown. Initial prenatal visit scheduled on 4/6.

## 2019-03-10 LAB — OBSTETRIC PANEL, INCLUDING HIV
Antibody Screen: NEGATIVE
BASOS ABS: 0.1 10*3/uL (ref 0.0–0.2)
Basos: 0 %
EOS (ABSOLUTE): 0.4 10*3/uL (ref 0.0–0.4)
Eos: 2 %
HEP B S AG: NEGATIVE
HIV Screen 4th Generation wRfx: NONREACTIVE
Hematocrit: 38.3 % (ref 34.0–46.6)
Hemoglobin: 13.2 g/dL (ref 11.1–15.9)
IMMATURE GRANS (ABS): 0.1 10*3/uL (ref 0.0–0.1)
IMMATURE GRANULOCYTES: 1 %
LYMPHS ABS: 3 10*3/uL (ref 0.7–3.1)
LYMPHS: 20 %
MCH: 32 pg (ref 26.6–33.0)
MCHC: 34.5 g/dL (ref 31.5–35.7)
MCV: 93 fL (ref 79–97)
Monocytes Absolute: 0.9 10*3/uL (ref 0.1–0.9)
Monocytes: 6 %
NEUTROS PCT: 71 %
Neutrophils Absolute: 10.7 10*3/uL — ABNORMAL HIGH (ref 1.4–7.0)
PLATELETS: 251 10*3/uL (ref 150–450)
RBC: 4.12 x10E6/uL (ref 3.77–5.28)
RDW: 12.3 % (ref 11.7–15.4)
RPR Ser Ql: NONREACTIVE
Rh Factor: POSITIVE
Rubella Antibodies, IGG: 4.28 index (ref >0.99)
WBC: 15.1 10*3/uL — AB (ref 3.4–10.8)

## 2019-03-11 LAB — URINE CULTURE, OB REFLEX

## 2019-03-11 LAB — CULTURE, OB URINE

## 2019-03-12 ENCOUNTER — Encounter: Payer: Self-pay | Admitting: *Deleted

## 2019-03-12 LAB — GC/CHLAMYDIA PROBE AMP (~~LOC~~) NOT AT ARMC
Chlamydia: NEGATIVE
Neisseria Gonorrhea: NEGATIVE

## 2019-03-23 ENCOUNTER — Telehealth: Payer: Self-pay | Admitting: Obstetrics and Gynecology

## 2019-03-23 NOTE — Telephone Encounter (Signed)
Left the patient a detailed voicemail message concerning the restrictions due to [place].

## 2019-03-26 ENCOUNTER — Other Ambulatory Visit: Payer: Self-pay

## 2019-03-26 ENCOUNTER — Encounter: Payer: Medicaid Other | Admitting: Family Medicine

## 2019-03-26 ENCOUNTER — Ambulatory Visit (INDEPENDENT_AMBULATORY_CARE_PROVIDER_SITE_OTHER): Payer: BC Managed Care – PPO | Admitting: Family Medicine

## 2019-03-26 ENCOUNTER — Encounter: Payer: Self-pay | Admitting: *Deleted

## 2019-03-26 VITALS — BP 113/66 | HR 64 | Temp 97.8°F | Wt 180.9 lb

## 2019-03-26 DIAGNOSIS — Z8759 Personal history of other complications of pregnancy, childbirth and the puerperium: Secondary | ICD-10-CM

## 2019-03-26 DIAGNOSIS — B373 Candidiasis of vulva and vagina: Secondary | ICD-10-CM

## 2019-03-26 DIAGNOSIS — Z113 Encounter for screening for infections with a predominantly sexual mode of transmission: Secondary | ICD-10-CM

## 2019-03-26 DIAGNOSIS — O09299 Supervision of pregnancy with other poor reproductive or obstetric history, unspecified trimester: Secondary | ICD-10-CM

## 2019-03-26 DIAGNOSIS — O0990 Supervision of high risk pregnancy, unspecified, unspecified trimester: Secondary | ICD-10-CM

## 2019-03-26 DIAGNOSIS — O09892 Supervision of other high risk pregnancies, second trimester: Secondary | ICD-10-CM

## 2019-03-26 DIAGNOSIS — B9689 Other specified bacterial agents as the cause of diseases classified elsewhere: Secondary | ICD-10-CM | POA: Diagnosis not present

## 2019-03-26 DIAGNOSIS — N898 Other specified noninflammatory disorders of vagina: Secondary | ICD-10-CM

## 2019-03-26 DIAGNOSIS — Z124 Encounter for screening for malignant neoplasm of cervix: Secondary | ICD-10-CM

## 2019-03-26 DIAGNOSIS — O09899 Supervision of other high risk pregnancies, unspecified trimester: Secondary | ICD-10-CM

## 2019-03-26 DIAGNOSIS — B009 Herpesviral infection, unspecified: Secondary | ICD-10-CM

## 2019-03-26 DIAGNOSIS — Z1151 Encounter for screening for human papillomavirus (HPV): Secondary | ICD-10-CM

## 2019-03-26 DIAGNOSIS — Z3A18 18 weeks gestation of pregnancy: Secondary | ICD-10-CM

## 2019-03-26 DIAGNOSIS — N760 Acute vaginitis: Secondary | ICD-10-CM

## 2019-03-26 DIAGNOSIS — O09292 Supervision of pregnancy with other poor reproductive or obstetric history, second trimester: Secondary | ICD-10-CM

## 2019-03-26 LAB — POCT URINALYSIS DIP (DEVICE)
Bilirubin Urine: NEGATIVE
Glucose, UA: NEGATIVE mg/dL
Hgb urine dipstick: NEGATIVE
Ketones, ur: NEGATIVE mg/dL
Leukocytes,Ua: NEGATIVE
Nitrite: NEGATIVE
Protein, ur: NEGATIVE mg/dL
Specific Gravity, Urine: 1.015 (ref 1.005–1.030)
Urobilinogen, UA: 0.2 mg/dL (ref 0.0–1.0)
pH: 7 (ref 5.0–8.0)

## 2019-03-26 MED ORDER — ASPIRIN EC 81 MG PO TBEC: 81.0000 mg | DELAYED_RELEASE_TABLET | Freq: Every day | ORAL | 2 refills | Status: DC

## 2019-03-26 NOTE — Progress Notes (Signed)
Subjective:  Christie Ali is a G2P1001 101w6d being seen today for her first obstetrical visit.  Her obstetrical history is significant for GHTN in last pregnancy, short interval pregnancy. Patient does intend to breast feed. Unplanned pregnancy. FOB involved - boyfriend. Pregnancy history fully reviewed.  Patient reports nausea.  BP 113/66   Pulse 64   Temp 97.8 F (36.6 C)   LMP 11/14/2018 (Approximate)   HISTORY: OB History  Gravida Para Term Preterm AB Living  2 1 1  0 0 1  SAB TAB Ectopic Multiple Live Births  0 0 0 0 1    # Outcome Date GA Lbr Len/2nd Weight Sex Delivery Anes PTL Lv  2 Current           1 Term 06/22/18 [redacted]w[redacted]d 06:11 / 01:15 7 lb 10.4 oz (3.47 kg) F Vag-Spont EPI  LIV     Birth Comments: extra finger on right hand    Past Medical History:  Diagnosis Date  . Asthma   . Pregnancy induced hypertension    Labor induced @ 40 wks    Past Surgical History:  Procedure Laterality Date  . NO PAST SURGERIES      Family History  Problem Relation Age of Onset  . Hypertension Mother   . Hypertension Father   . Cancer Maternal Grandmother   . Cancer Paternal Grandmother      Exam  BP 113/66   Pulse 64   Temp 97.8 F (36.6 C)   LMP 11/14/2018 (Approximate)   CONSTITUTIONAL: Well-developed, well-nourished female in no acute distress.  HENT:  Normocephalic, atraumatic, External right and left ear normal. Oropharynx is clear and moist EYES: Conjunctivae and EOM are normal. Pupils are equal, round, and reactive to light. No scleral icterus.  NECK: Normal range of motion, supple, no masses.  Normal thyroid.  CARDIOVASCULAR: Normal heart rate noted, regular rhythm RESPIRATORY: Clear to auscultation bilaterally. Effort and breath sounds normal, no problems with respiration noted. BREASTS: Symmetric in size. No masses, skin changes, nipple drainage, or lymphadenopathy. ABDOMEN: Soft, normal bowel sounds, no distention noted.  No tenderness, rebound or  guarding.  PELVIC: Normal appearing external genitalia; normal appearing vaginal mucosa and cervix. No abnormal discharge noted. Normal uterine size, no other palpable masses, no uterine or adnexal tenderness. MUSCULOSKELETAL: Normal range of motion. No tenderness.  No cyanosis, clubbing, or edema.  2+ distal pulses. SKIN: Skin is warm and dry. No rash noted. Not diaphoretic. No erythema. No pallor. NEUROLOGIC: Alert and oriented to person, place, and time. Normal reflexes, muscle tone coordination. No cranial nerve deficit noted. PSYCHIATRIC: Normal mood and affect. Normal behavior. Normal judgment and thought content.    Assessment:    Pregnancy: G2P1001 Patient Active Problem List   Diagnosis Date Noted  . Supervision of high risk pregnancy, antepartum 03/09/2019  . Short interval between pregnancies affecting pregnancy, antepartum 03/09/2019  . Hx of fetal anomaly in prior pregnancy, currently pregnant 03/09/2019  . History of gestational hypertension 08/02/2018  . 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 Asthma 11/30/2017      Plan:   1. Supervision of high risk pregnancy, antepartum FHT and FH normal.  Panorama and AFP.  - CHL AMB BABYSCRIPTS SCHEDULE OPTIMIZATION - Cytology - PAP( [place]) - Protein / creatinine ratio, urine - Genetic Screening - AFP, Serum, Open Spina Bifida  2. Short interval between pregnancies affecting pregnancy, antepartum - CHL AMB BABYSCRIPTS SCHEDULE OPTIMIZATION - Cytology - PAP( [place]) - Protein / creatinine ratio, urine - Genetic  Screening - AFP, Serum, Open Spina Bifida  3. History of gestational hypertension CMP and UP:C - Protein / creatinine ratio, urine - Genetic Screening - AFP, Serum, Open Spina Bifida  4. HSV-2 infection PPx at 35 weeks - Genetic Screening  5. Hx of fetal anomaly in prior pregnancy, currently pregnant [place] ordered. - AFP, Serum, Open Spina Bifida  6. Vaginal irritation BVag, CVag/   Problem list  reviewed and updated. 75% of 30 min visit spent on counseling and coordination of care.     Truett Mainland 03/26/2019

## 2019-03-26 NOTE — Telephone Encounter (Signed)
Pt discussed with provider today at her visit.

## 2019-03-26 NOTE — Patient Instructions (Signed)
Download free app: [employer] Web[employer] in order to have video visit next time

## 2019-03-26 NOTE — Progress Notes (Signed)
Here for new ob.  Given bp cuff and demonstrated how to use and had her return demonstration I sent babyscripts app to her phone and instructed to enter bp weekly.

## 2019-03-27 ENCOUNTER — Other Ambulatory Visit: Payer: Self-pay | Admitting: Family Medicine

## 2019-03-27 LAB — PROTEIN / CREATININE RATIO, URINE
Creatinine, Urine: 169.5 mg/dL
Protein, Ur: 11 mg/dL
Protein/Creat Ratio: 65 mg/g creat (ref 0–200)

## 2019-03-27 LAB — CERVICOVAGINAL ANCILLARY ONLY
Bacterial vaginitis: POSITIVE — AB
Candida vaginitis: POSITIVE — AB

## 2019-03-27 MED ORDER — METRONIDAZOLE 500 MG PO TABS: 500.0000 mg | ORAL_TABLET | Freq: Two times a day (BID) | ORAL | 0 refills | Status: DC

## 2019-03-27 MED ORDER — FLUCONAZOLE 150 MG PO TABS: 150.0000 mg | ORAL_TABLET | Freq: Once | ORAL | 1 refills | Status: AC

## 2019-03-28 ENCOUNTER — Other Ambulatory Visit (HOSPITAL_COMMUNITY): Payer: Medicaid Other

## 2019-03-28 LAB — AFP, SERUM, OPEN SPINA BIFIDA
AFP MoM: 1
AFP Value: 48.5 ng/mL
Gest. Age on Collection Date: 19 weeks
Maternal Age At EDD: 32.2 yr
OSBR Risk 1 IN: 10000
Test Results:: NEGATIVE
Weight: 180 [lb_av]

## 2019-03-28 LAB — CYTOLOGY - PAP
Chlamydia: NEGATIVE
Diagnosis: NEGATIVE
HPV: NOT DETECTED
Neisseria Gonorrhea: NEGATIVE
Trichomonas: NEGATIVE

## 2019-04-09 ENCOUNTER — Encounter: Payer: Self-pay | Admitting: *Deleted

## 2019-04-09 NOTE — Addendum Note (Signed)
Addended by: Samuel Germany on: 04/09/2019 04:54 PM   Modules accepted: Orders

## 2019-04-11 ENCOUNTER — Other Ambulatory Visit: Payer: Self-pay

## 2019-04-11 ENCOUNTER — Ambulatory Visit (HOSPITAL_COMMUNITY)
Admission: RE | Admit: 2019-04-11 | Discharge: 2019-04-11 | Disposition: A | Discharge status: Home / Self Care | Payer: BLUE CROSS/BLUE SHIELD | Source: Ambulatory Visit | Attending: Obstetrics and Gynecology | Admitting: Obstetrics and Gynecology

## 2019-04-11 DIAGNOSIS — O09892 Supervision of other high risk pregnancies, second trimester: Secondary | ICD-10-CM | POA: Diagnosis not present

## 2019-04-11 DIAGNOSIS — Z3A21 21 weeks gestation of pregnancy: Secondary | ICD-10-CM | POA: Diagnosis not present

## 2019-04-11 DIAGNOSIS — Z363 Encounter for antenatal screening for malformations: Secondary | ICD-10-CM | POA: Diagnosis not present

## 2019-04-11 DIAGNOSIS — O09299 Supervision of pregnancy with other poor reproductive or obstetric history, unspecified trimester: Secondary | ICD-10-CM | POA: Diagnosis not present

## 2019-04-11 DIAGNOSIS — O0990 Supervision of high risk pregnancy, unspecified, unspecified trimester: Secondary | ICD-10-CM | POA: Insufficient documentation

## 2019-04-15 ENCOUNTER — Observation Stay
Admission: EM | Admit: 2019-04-15 | Discharge: 2019-04-15 | Disposition: A | Discharge status: Home / Self Care | Payer: BLUE CROSS/BLUE SHIELD | Attending: Obstetrics and Gynecology | Admitting: Obstetrics and Gynecology

## 2019-04-15 ENCOUNTER — Other Ambulatory Visit: Payer: Self-pay

## 2019-04-15 ENCOUNTER — Encounter: Payer: Self-pay | Admitting: *Deleted

## 2019-04-15 DIAGNOSIS — Z79899 Other long term (current) drug therapy: Secondary | ICD-10-CM | POA: Insufficient documentation

## 2019-04-15 DIAGNOSIS — Z3A21 21 weeks gestation of pregnancy: Secondary | ICD-10-CM | POA: Insufficient documentation

## 2019-04-15 DIAGNOSIS — Z8249 Family history of ischemic heart disease and other diseases of the circulatory system: Secondary | ICD-10-CM | POA: Insufficient documentation

## 2019-04-15 DIAGNOSIS — R109 Unspecified abdominal pain: Secondary | ICD-10-CM

## 2019-04-15 DIAGNOSIS — O99332 Smoking (tobacco) complicating pregnancy, second trimester: Secondary | ICD-10-CM | POA: Insufficient documentation

## 2019-04-15 DIAGNOSIS — O26892 Other specified pregnancy related conditions, second trimester: Secondary | ICD-10-CM | POA: Diagnosis not present

## 2019-04-15 DIAGNOSIS — O09299 Supervision of pregnancy with other poor reproductive or obstetric history, unspecified trimester: Secondary | ICD-10-CM

## 2019-04-15 DIAGNOSIS — K529 Noninfective gastroenteritis and colitis, unspecified: Secondary | ICD-10-CM | POA: Diagnosis not present

## 2019-04-15 DIAGNOSIS — R1011 Right upper quadrant pain: Secondary | ICD-10-CM | POA: Diagnosis not present

## 2019-04-15 DIAGNOSIS — O0990 Supervision of high risk pregnancy, unspecified, unspecified trimester: Secondary | ICD-10-CM

## 2019-04-15 DIAGNOSIS — O99612 Diseases of the digestive system complicating pregnancy, second trimester: Secondary | ICD-10-CM | POA: Insufficient documentation

## 2019-04-15 DIAGNOSIS — Z7982 Long term (current) use of aspirin: Secondary | ICD-10-CM | POA: Diagnosis not present

## 2019-04-15 DIAGNOSIS — O09899 Supervision of other high risk pregnancies, unspecified trimester: Secondary | ICD-10-CM

## 2019-04-15 LAB — URINALYSIS, COMPLETE (UACMP) WITH MICROSCOPIC
Bilirubin Urine: NEGATIVE
Glucose, UA: NEGATIVE mg/dL
Hgb urine dipstick: NEGATIVE
Ketones, ur: NEGATIVE mg/dL
Leukocytes,Ua: NEGATIVE
Nitrite: NEGATIVE
Protein, ur: NEGATIVE mg/dL
Specific Gravity, Urine: 1.021 (ref 1.005–1.030)
pH: 6 (ref 5.0–8.0)

## 2019-04-15 LAB — URINE DRUG SCREEN, QUALITATIVE (ARMC ONLY)
Amphetamines, Ur Screen: NOT DETECTED
Barbiturates, Ur Screen: NOT DETECTED
Benzodiazepine, Ur Scrn: NOT DETECTED
Cannabinoid 50 Ng, Ur ~~LOC~~: POSITIVE — AB
Cocaine Metabolite,Ur ~~LOC~~: NOT DETECTED
MDMA (Ecstasy)Ur Screen: NOT DETECTED
Methadone Scn, Ur: NOT DETECTED
Opiate, Ur Screen: NOT DETECTED
Phencyclidine (PCP) Ur S: NOT DETECTED
Tricyclic, Ur Screen: NOT DETECTED

## 2019-04-15 LAB — CBC
HCT: 38.5 % (ref 36.0–46.0)
Hemoglobin: 13.1 g/dL (ref 12.0–15.0)
MCH: 32.3 pg (ref 26.0–34.0)
MCHC: 34 g/dL (ref 30.0–36.0)
MCV: 95.1 fL (ref 80.0–100.0)
Platelets: 237 10*3/uL (ref 150–400)
RBC: 4.05 MIL/uL (ref 3.87–5.11)
RDW: 12.5 % (ref 11.5–15.5)
WBC: 14.3 10*3/uL — ABNORMAL HIGH (ref 4.0–10.5)
nRBC: 0 % (ref 0.0–0.2)

## 2019-04-15 LAB — COMPREHENSIVE METABOLIC PANEL
ALT: 8 U/L (ref 0–44)
AST: 14 U/L — ABNORMAL LOW (ref 15–41)
Albumin: 3.2 g/dL — ABNORMAL LOW (ref 3.5–5.0)
Alkaline Phosphatase: 55 U/L (ref 38–126)
Anion gap: 7 (ref 5–15)
BUN: 8 mg/dL (ref 6–20)
CO2: 22 mmol/L (ref 22–32)
Calcium: 8.4 mg/dL — ABNORMAL LOW (ref 8.9–10.3)
Chloride: 106 mmol/L (ref 98–111)
Creatinine, Ser: 0.85 mg/dL (ref 0.44–1.00)
GFR calc Af Amer: >60 mL/min (ref >60)
GFR calc non Af Amer: >60 mL/min (ref >60)
Glucose, Bld: 96 mg/dL (ref 70–99)
Potassium: 3.7 mmol/L (ref 3.5–5.1)
Sodium: 135 mmol/L (ref 135–145)
Total Bilirubin: 0.8 mg/dL (ref 0.3–1.2)
Total Protein: 6.5 g/dL (ref 6.5–8.1)

## 2019-04-15 MED ORDER — FAMOTIDINE 20 MG PO TABS
20.0000 mg | ORAL_TABLET | Freq: Once | ORAL | Status: AC
  Administered 2019-04-15: 20 mg via ORAL
  Filled 2019-04-15: qty 1

## 2019-04-15 MED ORDER — FAMOTIDINE 20 MG PO TABS: 20.0000 mg | ORAL_TABLET | Freq: Two times a day (BID) | ORAL | 0 refills | Status: DC

## 2019-04-15 MED ORDER — ONDANSETRON 4 MG PO TBDP
4.0000 mg | ORAL_TABLET | Freq: Four times a day (QID) | ORAL | Status: DC | PRN
  Administered 2019-04-15: 4 mg via ORAL
  Filled 2019-04-15: qty 1

## 2019-04-15 MED ORDER — ONDANSETRON 4 MG PO TBDP: 4.0000 mg | ORAL_TABLET | Freq: Four times a day (QID) | ORAL | 0 refills | Status: DC | PRN

## 2019-04-15 NOTE — Discharge Instructions (Signed)
Please keep your next scheduled appointment.  Let your regular provider know about your visit today and ask about follow up.  If you have any questions or concerns you can call your on call provider.  You may also call the nurse's desk at the [facility] with questions. [phone].  If you have urgent concerns please go to the nearest emergency department for evaluation.

## 2019-04-15 NOTE — OB Triage Note (Signed)
Patient to OBS 4 with complaint of right side upper abdominal pain.  She also reports that she vomited twice this morning and has diarrhea since Tuesday of last week.  She had issue with constipation last Sunday and began having diarrhea. She reports normal fetal movement, no LOF or bleeding. She states that she was treated for BV and yeast last week.

## 2019-04-15 NOTE — Discharge Summary (Signed)
Christie Ali is a 33 y.o. female. She is at [redacted]w[redacted]d gestation. Patient's last menstrual period was 11/14/2018 (approximate). Estimated Date of Delivery: 08/21/19  Prenatal care site: Unassigned: High risk clinic at [facility]  Current pregnancy complicated by:  1. Closely spaced pregnancies, SVD 06/2018 2. Hx GHTN with last preg 3. Hx childhood asthma 4. MJ use  Chief complaint: Abdominal pain  Location: right upper abdomen Onset/timing: 6 days ago Duration: intermittent Quality: sharp brief pains associated with bowel movements Severity: n/a Aggravating or alleviating conditions: n/a Associated signs/symptoms: initial constipation then loose stools x 6days, vomiting and worsening pain this morning.  Context: no hx gallstones.   S: Resting comfortably. no CTX, no VB.no LOF,  Active fetal movement. Denies: HA, visual changes, SOB.   Maternal Medical History:   Past Medical History:  Diagnosis Date  . Asthma    as a child  . Pregnancy induced hypertension    Labor induced @ 40 wks    Past Surgical History:  Procedure Laterality Date  . NO PAST SURGERIES      Allergies  Allergen Reactions  . Sulfa Antibiotics Hives  . Other Rash    Nickel     Prior to Admission medications   Medication Sig Start Date End Date Taking? Authorizing Provider  acetaminophen (TYLENOL) 500 MG tablet Take 1,000 mg by mouth every 6 (six) hours as needed for mild pain or headache.    [provider]  aspirin EC 81 MG tablet Take 1 tablet (81 mg total) by mouth daily. Take after 12 weeks for prevention of preeclampsia later in pregnancy 03/26/19   Truett Mainland, DO  metroNIDAZOLE (FLAGYL) 500 MG tablet Take 1 tablet (500 mg total) by mouth 2 (two) times daily. 03/27/19   Truett Mainland, DO  Prenatal Vit-Fe Fumarate-FA (MULTIVITAMIN-PRENATAL) 27-0.8 MG TABS tablet Take 1 tablet by mouth daily at 12 noon.    [provider]  promethazine (PHENERGAN) 25 MG tablet Take 1  tablet (25 mg total) by mouth every 6 (six) hours as needed for nausea or vomiting. 01/31/19   Luvenia Redden, PA-C      Social History: She  reports that she has been smoking cigarettes. She has been smoking about 0.50 packs per day. She has never used smokeless tobacco. She reports current drug use. Drug: Marijuana. She reports that she does not drink alcohol.  Family History: family history includes Cancer in her maternal grandmother and paternal grandmother; Hypertension in her father and mother.    Review of Systems: A full review of systems was performed and negative except as noted in the HPI.     O:  BP 108/71 (BP Location: Right Arm)   Pulse 72   Temp 98.3 F (36.8 C) (Oral)   Ht 5\' 8"  (1.727 m)   Wt 81.6 kg   LMP 11/14/2018 (Approximate)   BMI 27.37 kg/m  Results for orders placed or performed during the hospital encounter of 04/15/19 (from the past 48 hour(s))  CBC   Collection Time: 04/15/19  8:34 AM  Result Value Ref Range   WBC 14.3 (H) 4.0 - 10.5 K/uL   RBC 4.05 3.87 - 5.11 MIL/uL   Hemoglobin 13.1 12.0 - 15.0 g/dL   HCT 38.5 36.0 - 46.0 %   MCV 95.1 80.0 - 100.0 fL   MCH 32.3 26.0 - 34.0 pg   MCHC 34.0 30.0 - 36.0 g/dL   RDW 12.5 11.5 - 15.5 %   Platelets 237 150 -  400 K/uL   nRBC 0.0 0.0 - 0.2 %  Comprehensive metabolic panel   Collection Time: 04/15/19  8:34 AM  Result Value Ref Range   Sodium 135 135 - 145 mmol/L   Potassium 3.7 3.5 - 5.1 mmol/L   Chloride 106 98 - 111 mmol/L   CO2 22 22 - 32 mmol/L   Glucose, Bld 96 70 - 99 mg/dL   BUN 8 6 - 20 mg/dL   Creatinine, Ser 0.85 0.44 - 1.00 mg/dL   Calcium 8.4 (L) 8.9 - 10.3 mg/dL   Total Protein 6.5 6.5 - 8.1 g/dL   Albumin 3.2 (L) 3.5 - 5.0 g/dL   AST 14 (L) 15 - 41 U/L   ALT 8 0 - 44 U/L   Alkaline Phosphatase 55 38 - 126 U/L   Total Bilirubin 0.8 0.3 - 1.2 mg/dL   GFR calc non Af Amer >60 >60 mL/min   GFR calc Af Amer >60 >60 mL/min   Anion gap 7 5 - 15  Urine Drug Screen, Qualitative ([facility]  only)   Collection Time: 04/15/19  8:36 AM  Result Value Ref Range   Tricyclic, Ur Screen NONE DETECTED NONE DETECTED   Amphetamines, Ur Screen NONE DETECTED NONE DETECTED   MDMA (Ecstasy)Ur Screen NONE DETECTED NONE DETECTED   Cocaine Metabolite,Ur [place] NONE DETECTED NONE DETECTED   Opiate, Ur Screen NONE DETECTED NONE DETECTED   Phencyclidine (PCP) Ur S NONE DETECTED NONE DETECTED   Cannabinoid 50 Ng, Ur [place] POSITIVE (A) NONE DETECTED   Barbiturates, Ur Screen NONE DETECTED NONE DETECTED   Benzodiazepine, Ur Scrn NONE DETECTED NONE DETECTED   Methadone Scn, Ur NONE DETECTED NONE DETECTED  Urinalysis, Complete w Microscopic   Collection Time: 04/15/19  8:36 AM  Result Value Ref Range   Color, Urine YELLOW (A) YELLOW   APPearance CLOUDY (A) CLEAR   Specific Gravity, Urine 1.021 1.005 - 1.030   pH 6.0 5.0 - 8.0   Glucose, UA NEGATIVE NEGATIVE mg/dL   Hgb urine dipstick NEGATIVE NEGATIVE   Bilirubin Urine NEGATIVE NEGATIVE   Ketones, ur NEGATIVE NEGATIVE mg/dL   Protein, ur NEGATIVE NEGATIVE mg/dL   Nitrite NEGATIVE NEGATIVE   Leukocytes,Ua NEGATIVE NEGATIVE   RBC / HPF 0-5 0 - 5 RBC/hpf   WBC, UA 0-5 0 - 5 WBC/hpf   Bacteria, UA RARE (A) NONE SEEN   Squamous Epithelial / LPF 11-20 0 - 5   Mucus PRESENT      Constitutional: NAD, AAOx3  HE/ENT: extraocular movements grossly intact, moist mucous membranes CV: RRR PULM: nl respiratory effort, CTABL     Abd: gravid, non-tender, non-distended, soft; No tenderness to palpation. + BS Ext: Non-tender, Nonedematous   Psych: mood appropriate, speech normal Pelvic: deferred  Fetal  monitoring: Doppler 145bpm  Toco: no e/o uterine contractions    A/P: 33 y.o. [redacted]w[redacted]d here for antenatal surveillance for RUQ pain  Principle Diagnosis:  RUQ pain, colitis   Preterm labor: not present.   Fetal Wellbeing: reassuring FHR via doppler; No UCs on toco.   Colitis, reviewed bland diet, advised clear liquids, continue Pepcid BID,  antiemetics prn.   D/c home stable, precautions reviewed, follow-up as scheduled on 4/30 with prenatal care provider.     Francetta Found, CNM 04/15/2019  9:51 AM

## 2019-04-19 ENCOUNTER — Ambulatory Visit (INDEPENDENT_AMBULATORY_CARE_PROVIDER_SITE_OTHER): Payer: Self-pay | Admitting: Obstetrics & Gynecology

## 2019-04-19 ENCOUNTER — Other Ambulatory Visit: Payer: Self-pay

## 2019-04-19 ENCOUNTER — Encounter: Payer: Self-pay | Admitting: Family Medicine

## 2019-04-19 VITALS — BP 113/70 | Wt 180.0 lb

## 2019-04-19 DIAGNOSIS — Z3A22 22 weeks gestation of pregnancy: Secondary | ICD-10-CM

## 2019-04-19 DIAGNOSIS — B009 Herpesviral infection, unspecified: Secondary | ICD-10-CM

## 2019-04-19 DIAGNOSIS — O0990 Supervision of high risk pregnancy, unspecified, unspecified trimester: Secondary | ICD-10-CM

## 2019-04-19 DIAGNOSIS — Z8759 Personal history of other complications of pregnancy, childbirth and the puerperium: Secondary | ICD-10-CM

## 2019-04-19 DIAGNOSIS — O0992 Supervision of high risk pregnancy, unspecified, second trimester: Secondary | ICD-10-CM

## 2019-04-19 NOTE — Progress Notes (Signed)
I connected with  Christie Ali on 04/19/19 at  9:55 AM EDT by telephone and verified that I am speaking with the correct person using two identifiers.   I discussed the limitations, risks, security and privacy concerns of performing an evaluation and management service by telephone and the availability of in person appointments. I also discussed with the patient that there may be a patient responsible charge related to this service. The patient expressed understanding and agreed to proceed.  Alric Seton, [place] 04/19/2019  9:41 AM\

## 2019-04-19 NOTE — Progress Notes (Signed)
   PRENATAL VISIT NOTE TELEHEALTH VIRTUAL OBSTETRICS VISIT ENCOUNTER NOTE  I connected with@ on 04/19/19 at  9:55 AM EDT by Webex at home and verified that I am speaking with the correct person using two identifiers.   I discussed the limitations, risks, security and privacy concerns of performing an evaluation and management service by telephone and the availability of in person appointments. I also discussed with the patient that there may be a patient responsible charge related to this service. The patient expressed understanding and agreed to proceed. Subjective:  Christie Ali is a 33 y.o. G2P1001 at [redacted]w[redacted]d being seen today for ongoing prenatal care.  She is currently monitored for the following issues for this high pregnancy and has Asthma; HSV-2 infection; History of gestational hypertension; Supervision of high risk pregnancy, antepartum; Short interval between pregnancies affecting pregnancy, antepartum; Hx of fetal anomaly in prior pregnancy, currently pregnant; and Abdominal pain during pregnancy, second trimester on their problem list.  Patient reports no complaints. She was seen at the MAU this past weekend. Was given a medication, Pepcid, which has helped.  Reports fetal movement. Contractions: Not present. Vag. Bleeding: None.  Movement: Present. Denies any contractions, bleeding or leaking of fluid.   The following portions of the patient's history were reviewed and updated as appropriate: allergies, current medications, past family history, past medical history, past social history, past surgical history and problem list.   Objective:  There were no vitals filed for this visit.  Fetal Status:     Movement: Present     General:  Alert, oriented and cooperative. Patient is in no acute distress.  Respiratory: Normal respiratory effort, no problems with respiration noted  Mental Status: Normal mood and affect. Normal behavior. Normal judgment and thought content.  Rest of  physical exam deferred due to type of encounter  Assessment and Plan:  Pregnancy: G2P1001 at [redacted]w[redacted]d She is doing well today. Plan for virtual visit in 4 weeks, in person at 28 weeks for labs  Preterm labor symptoms and general obstetric precautions including but not limited to vaginal bleeding, contractions, leaking of fluid and fetal movement were reviewed in detail with the patient. I discussed the assessment and treatment plan with the patient. The patient was provided an opportunity to ask questions and all were answered. The patient agreed with the plan and demonstrated an understanding of the instructions. The patient was advised to call back or seek an in-person office evaluation/go to [facility] at [facility] for any urgent or concerning symptoms. Please refer to After Visit Summary for other counseling recommendations.  I provided 10 minutes of non-face-to-face time during this encounter. No follow-ups on file.  Future Appointments  Date Time Provider [place]  04/19/2019  9:55 AM Emily Filbert, MD [place], MD [facility] for [facility], [place]

## 2019-05-04 ENCOUNTER — Other Ambulatory Visit: Payer: Self-pay

## 2019-05-04 ENCOUNTER — Telehealth: Payer: BLUE CROSS/BLUE SHIELD | Admitting: Obstetrics & Gynecology

## 2019-05-04 ENCOUNTER — Telehealth: Payer: Self-pay | Admitting: Obstetrics & Gynecology

## 2019-05-04 NOTE — Progress Notes (Signed)
dnka

## 2019-05-04 NOTE — Telephone Encounter (Signed)
Attempted to call patient to let her know that her appointment today 5/15 will be done through her mychart. No answer, left message with this information and advised to give the office a call with any questions or concerns.

## 2019-05-04 NOTE — Progress Notes (Signed)
Attempted to call pt x2 with no success. Pt was called at the time of appointment and 15 minutes after. Voicemail was left informing pt of attempts and pt was encouraged to give the clinic a call.

## 2019-05-07 ENCOUNTER — Other Ambulatory Visit: Payer: Self-pay

## 2019-05-07 ENCOUNTER — Telehealth (INDEPENDENT_AMBULATORY_CARE_PROVIDER_SITE_OTHER): Payer: BLUE CROSS/BLUE SHIELD | Admitting: Obstetrics and Gynecology

## 2019-05-07 ENCOUNTER — Encounter: Payer: Self-pay | Admitting: Obstetrics and Gynecology

## 2019-05-07 DIAGNOSIS — O09299 Supervision of pregnancy with other poor reproductive or obstetric history, unspecified trimester: Secondary | ICD-10-CM

## 2019-05-07 DIAGNOSIS — Z8759 Personal history of other complications of pregnancy, childbirth and the puerperium: Secondary | ICD-10-CM

## 2019-05-07 DIAGNOSIS — O0990 Supervision of high risk pregnancy, unspecified, unspecified trimester: Secondary | ICD-10-CM

## 2019-05-07 DIAGNOSIS — Z3A24 24 weeks gestation of pregnancy: Secondary | ICD-10-CM

## 2019-05-07 DIAGNOSIS — B009 Herpesviral infection, unspecified: Secondary | ICD-10-CM

## 2019-05-07 DIAGNOSIS — O09899 Supervision of other high risk pregnancies, unspecified trimester: Secondary | ICD-10-CM

## 2019-05-07 DIAGNOSIS — O98513 Other viral diseases complicating pregnancy, third trimester: Secondary | ICD-10-CM

## 2019-05-07 NOTE — Progress Notes (Signed)
[place] VIRTUAL VIDEO VISIT ENCOUNTER NOTE  Provider location: [facility] for [place] at [facility]   I connected with Gwenlyn Saran on 05/07/19 at 11:15 AM EDT by MyChart Video Encounter at home and verified that I am speaking with the correct person using two identifiers.   I discussed the limitations, risks, security and privacy concerns of performing an evaluation and management service by telephone and the availability of in person appointments. I also discussed with the patient that there may be a patient responsible charge related to this service. The patient expressed understanding and agreed to proceed. Subjective:  Christie Ali is a 33 y.o. G2P1001 at [redacted]w[redacted]d being seen today for ongoing prenatal care.  She is currently monitored for the following issues for this low-risk pregnancy and has Asthma; HSV-2 infection; History of gestational hypertension; Supervision of high risk pregnancy, antepartum; Short interval between pregnancies affecting pregnancy, antepartum; Hx of fetal anomaly in prior pregnancy, currently pregnant; and Abdominal pain during pregnancy, second trimester on their problem list.  Patient reports no complaints.  Contractions: Not present. Vag. Bleeding: None.  Movement: Present. Denies any leaking of fluid.   The following portions of the patient's history were reviewed and updated as appropriate: allergies, current medications, past family history, past medical history, past social history, past surgical history and problem list.   Objective:  There were no vitals filed for this visit.  Fetal Status:     Movement: Present     General:  Alert, oriented and cooperative. Patient is in no acute distress.  Respiratory: Normal respiratory effort, no problems with respiration noted  Mental Status: Normal mood and affect. Normal behavior. Normal judgment and thought content.  Rest of physical exam deferred due to type of  encounter  Imaging: [place] Mfm Ob Comp + 14 Wk  Result Date: 04/11/2019 ----------------------------------------------------------------------  OBSTETRICS REPORT                       (Signed Final 04/11/2019 12:56 pm) ---------------------------------------------------------------------- Patient Info  ID #:       [id number]                          D.O.B.:  31-Jul-1986 (32 yrs)  Name:       Christie Ali                Visit Date: 04/11/2019 08:04 am ---------------------------------------------------------------------- Performed By  Performed By:     Enriqueta Shutter           Ref. Address:     [address], [place]                                                             [address]  Attending:        Sander Nephew      [facility]                    MD  [facility]  Referred By:      [facility] ---------------------------------------------------------------------- Orders   #  Description                          Code         Ordered By   1  [place] MFM OB COMP + 14 WK               76805.01     JACOB STINSON  ----------------------------------------------------------------------   #  Order #                    Accession #                 Episode #   1  [id number]                  [id number]                  [id number]  ---------------------------------------------------------------------- Indications   Encounter for antenatal screening for          Z36.3   malformations   Poor obstetric history: Previous gestational   O09.299   HTN   Short interval between pregancies, 2nd         O09.892   trimester   Prev preg extra digit on Rt hand   [redacted] weeks gestation of pregnancy                Z3A.21  ---------------------------------------------------------------------- Fetal Evaluation  Num Of Fetuses:         1  Fetal Heart Rate(bpm):  143  Cardiac Activity:       Observed  Presentation:           Cephalic  Placenta:               Anterior  P.  Cord Insertion:      Visualized, central  Amniotic Fluid  AFI FV:      Within normal limits                              Largest Pocket(cm)                              6.48 ---------------------------------------------------------------------- Biometry  BPD:      53.3  mm     G. Age:  22w 1d         86  %    CI:        76.41   %    70 - 86                                                          FL/HC:      18.6   %    15.9 - 20.3  HC:      193.2  mm     G. Age:  21w 4d         58  %    HC/AC:      1.18        1.06 - 1.25  AC:      163.8  mm  G. Age:  72w 3d         17  %    FL/BPD:     67.5   %  FL:         36  mm     G. Age:  21w 3d         78  %    FL/AC:      22.0   %    20 - 24  HUM:      34.9  mm     G. Age:  22w 0d         68  %  CER:        23  mm     G. Age:  21w 4d         61  %  LV:        6.8  mm  CM:        4.2  mm  Est. FW:     426  gm    0 lb 15 oz      51  % ---------------------------------------------------------------------- OB History  Gravidity:    2         Term:   1  Living:       1 ---------------------------------------------------------------------- Gestational Age  LMP:           21w 1d        Date:  11/14/18                 EDD:   08/21/19  U/S Today:     21w 5d                                        EDD:   08/17/19  Best:          21w 1d     Det. By:  LMP  (11/14/18)          EDD:   08/21/19 ---------------------------------------------------------------------- Anatomy  Cranium:               Appears normal         LVOT:                   Appears normal  Cavum:                 Appears normal         Aortic Arch:            Appears normal  Ventricles:            Appears normal         Ductal Arch:            Appears normal  Choroid Plexus:        Appears normal         Diaphragm:              Appears normal  Cerebellum:            Appears normal         Stomach:                Appears normal, left  sided  Posterior  Fossa:       Appears normal         Abdomen:                Appears normal  Nuchal Fold:           Not applicable (>25    Abdominal Wall:         Appears nml (cord                         wks GA)                                        insert, abd wall)  Face:                  Appears normal         Cord Vessels:           Appears normal (3                         (orbits and profile)                           vessel cord)  Lips:                  Appears normal         Kidneys:                Appear normal  Palate:                Appears normal         Bladder:                Appears normal  Thoracic:              Appears normal         Spine:                  Appears normal  Heart:                 Appears normal         Upper Extremities:      Appears normal                         (4CH, axis, and                         situs)  RVOT:                  Appears normal         Lower Extremities:      Appears normal  Other:  Fetus appears to be a female. Nasal bone visualized. Open hands          visualized. 3VV visualized. ---------------------------------------------------------------------- Cervix Uterus Adnexa  Cervix  Length:           4.18  cm.  Normal appearance by transabdominal scan.  Left Ovary  Within normal limits.  Right Ovary  Within normal limits. ---------------------------------------------------------------------- Impression  Normal interval growth.  No ultrasonic evidence of structural  fetal anomalies. ---------------------------------------------------------------------- Recommendations  Follow up as clinically indicated. ----------------------------------------------------------------------  Sander Nephew, MD Electronically Signed Final Report   04/11/2019 12:56 pm ----------------------------------------------------------------------   Assessment and Plan:  Pregnancy: G2P1001 at [redacted]w[redacted]d 1. Supervision of high risk pregnancy, antepartum Stable Has BP cuff but has not connected  with Baby Rx Nurse reviewed with pt  Glucola next visit  2. Short interval between pregnancies affecting pregnancy, antepartum   3. Hx of fetal anomaly in prior pregnancy, currently pregnant   4. History of gestational hypertension Not taking BASA BP monitoring reviewed with pt  5. HSV-2 infection Will need Valtrex at 36 weeks  Preterm labor symptoms and general obstetric precautions including but not limited to vaginal bleeding, contractions, leaking of fluid and fetal movement were reviewed in detail with the patient. I discussed the assessment and treatment plan with the patient. The patient was provided an opportunity to ask questions and all were answered. The patient agreed with the plan and demonstrated an understanding of the instructions. The patient was advised to call back or seek an in-person office evaluation/go to MAU at [facility] for any urgent or concerning symptoms. Please refer to After Visit Summary for other counseling recommendations.   I provided 11 minutes of face-to-face via MyChart time during this encounter.  Return in about 4 weeks (around 06/04/2019) for OB visit, face to face for Glucola.  No future appointments.  Chancy Milroy, MD [facility] [place], [place]

## 2019-05-10 IMAGING — US US MFM OB COMP + 14 WK
1 series · 14 of 28 positions shown · non-contrast
Comparison: none

[Series 1: us mfm ob comp + 14 wk · 80 acquisitions, 14 frames shown]
[im 3/80]
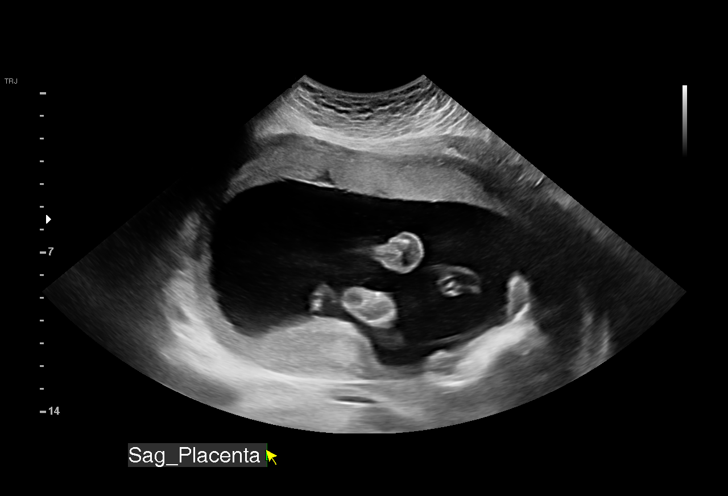
[im 9/80]
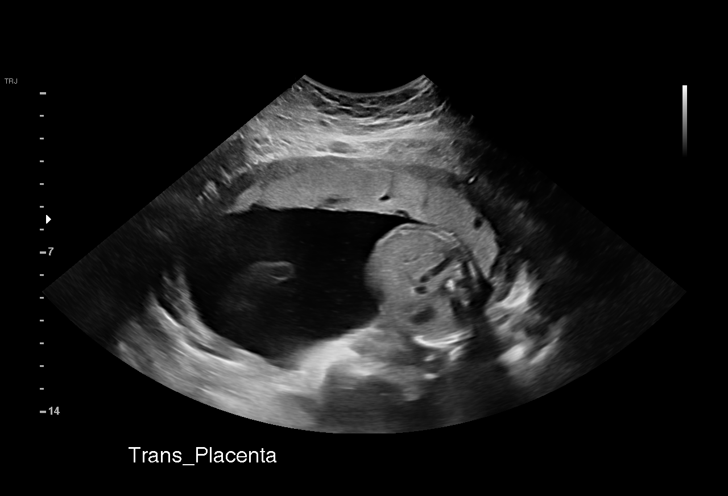
[im 15/80]
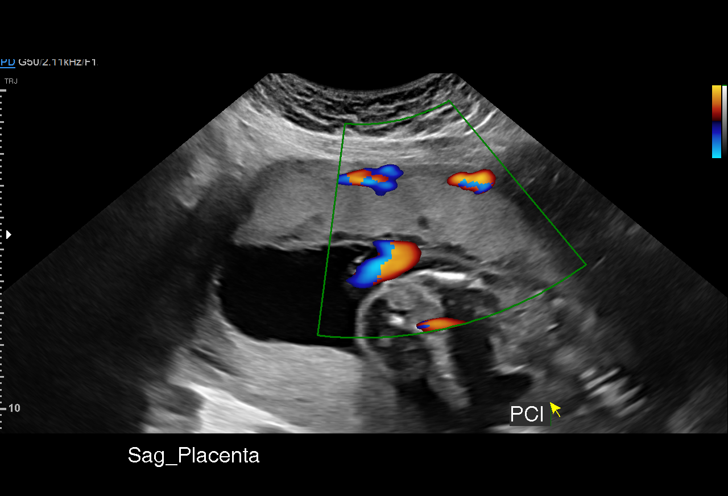
[im 21/80]
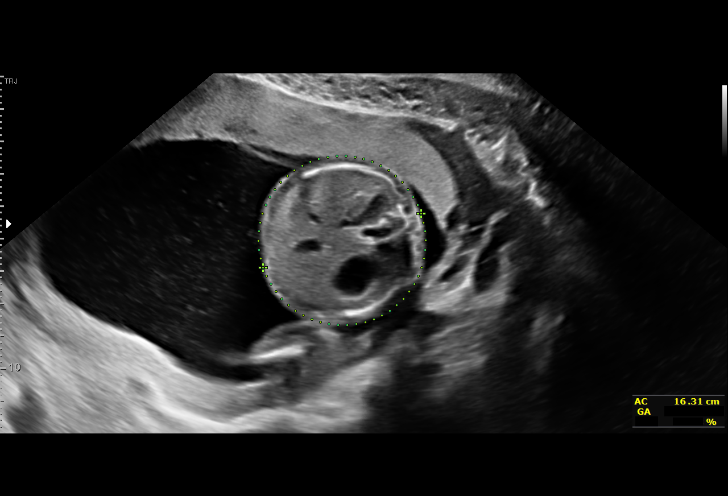
[im 27/80]
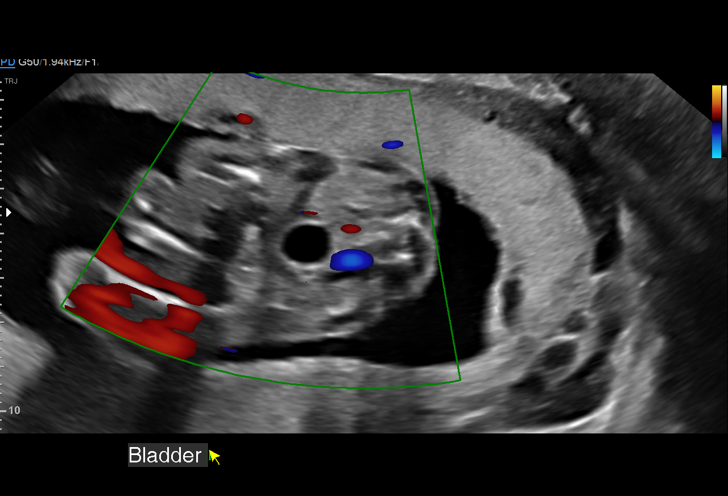
[im 33/80]
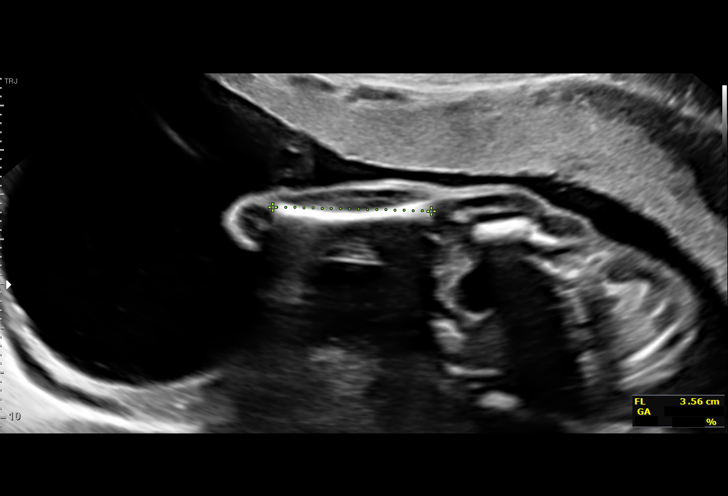
[im 39/80]
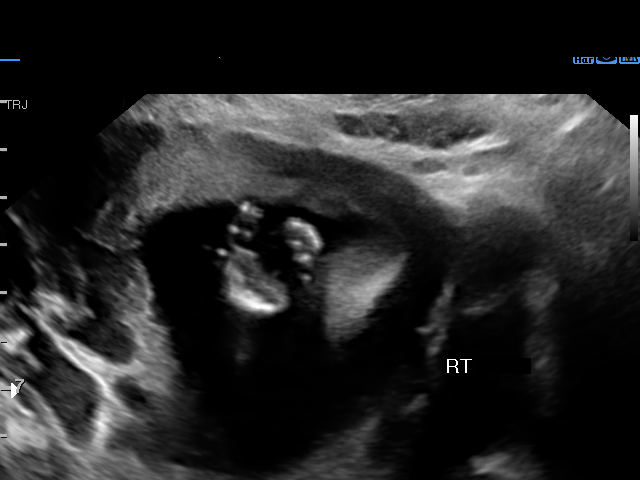
[im 44/80]
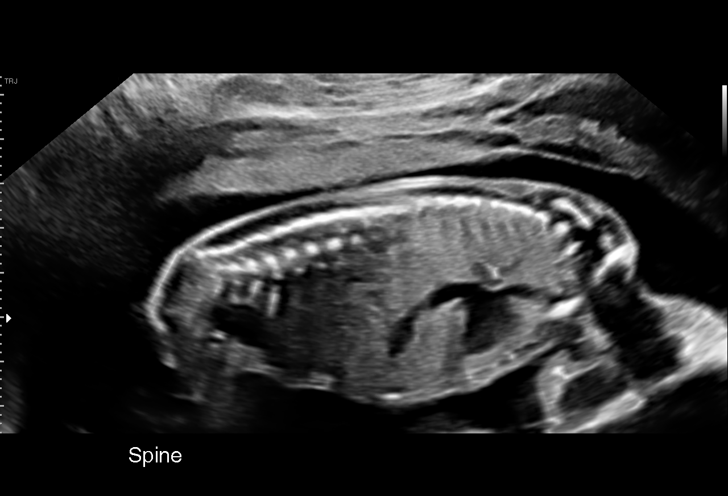
[im 50/80]
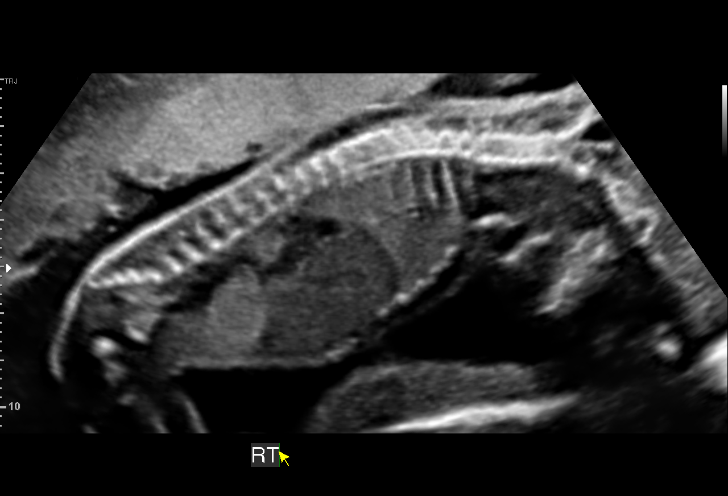
[im 56/80]
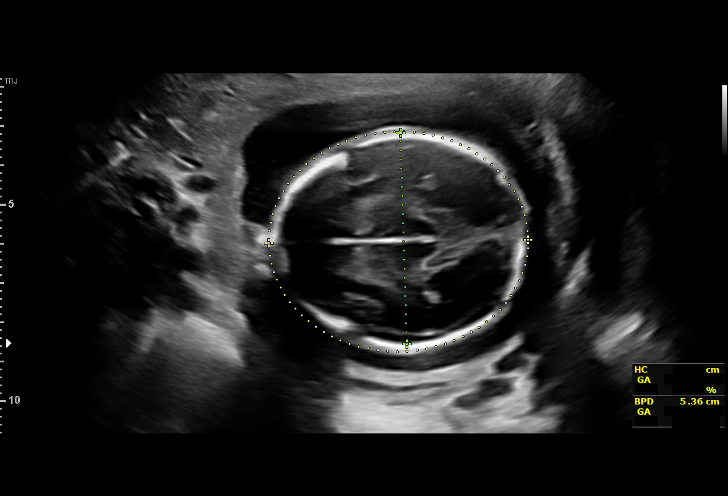
[im 62/80]
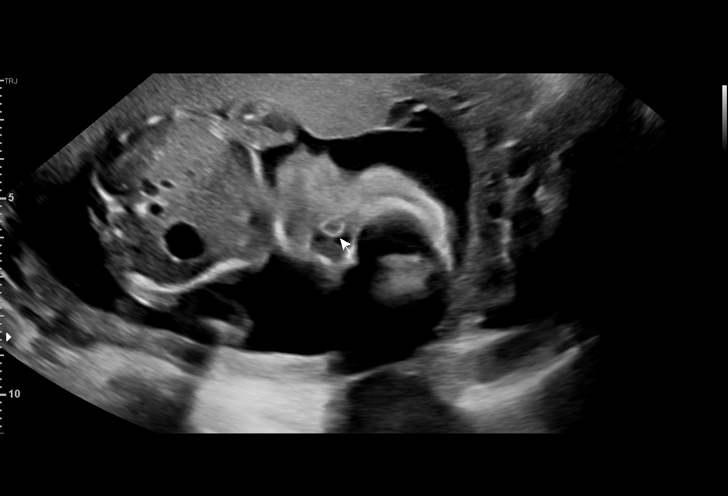
[im 68/80]
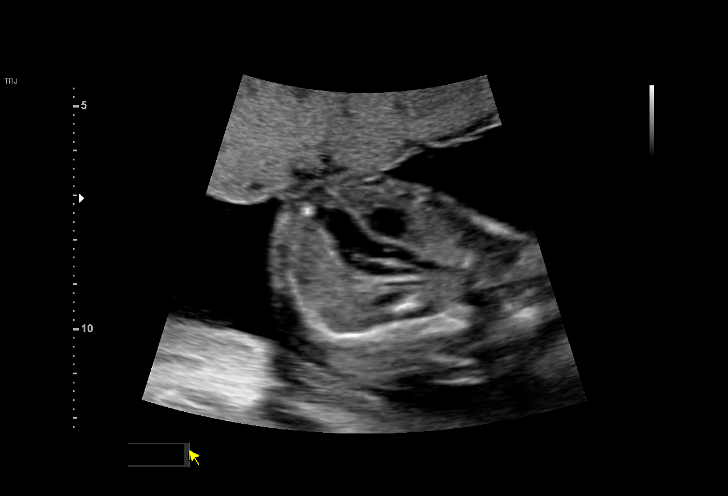
[im 74/80]
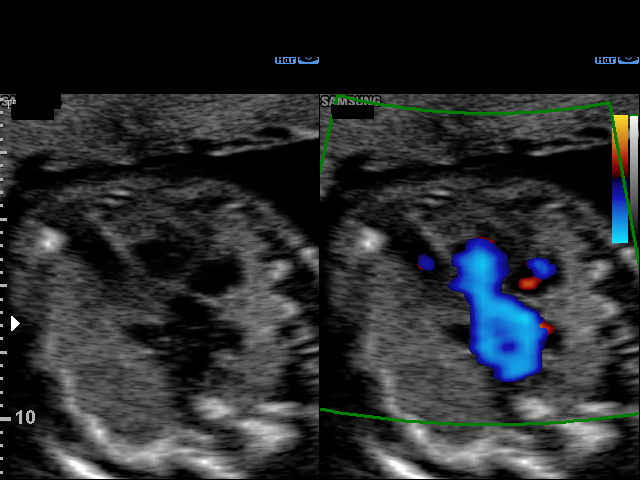
[im 80/80]
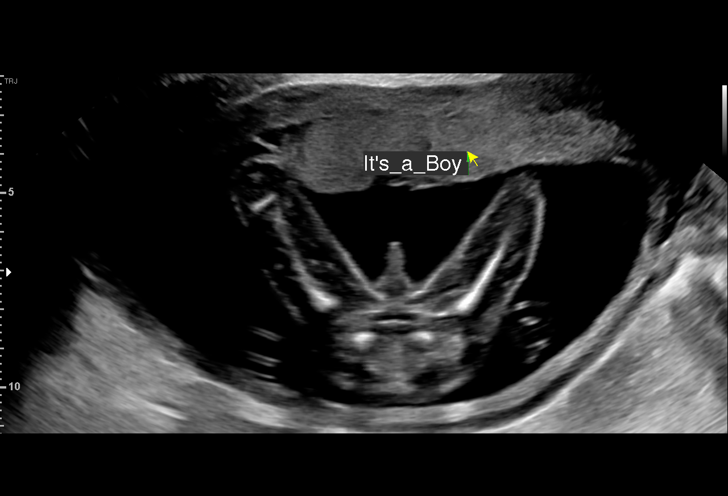

[14 of 28 positions shown; findings below may reference images not displayed]

[address]

 ----------------------------------------------------------------------

 ----------------------------------------------------------------------
Indications

  Encounter for antenatal screening for
  malformations
  Poor obstetric history: Previous gestational
  HTN
  Short interval between pregancies, 2nd
  trimester
  Prev preg extra digit on Rt hand
  21 weeks gestation of pregnancy
 ----------------------------------------------------------------------
Fetal Evaluation

 Num Of Fetuses:         1
 Fetal Heart Rate(bpm):  143
 Cardiac Activity:       Observed
 Presentation:           Cephalic
 Placenta:               Anterior
 P. Cord Insertion:      Visualized, central

 Amniotic Fluid
 AFI FV:      Within normal limits

                             Largest Pocket(cm)

Biometry

 BPD:      53.3  mm     G. Age:  22w 1d         86  %    CI:        76.41   %    70 - 86
                                                         FL/HC:      18.6   %    15.9 -
 HC:      193.2  mm     G. Age:  21w 4d         58  %    HC/AC:      1.18        1.06 -
 AC:      163.8  mm     G. Age:  21w 3d         53  %    FL/BPD:     67.5   %
 FL:         36  mm     G. Age:  21w 3d         50  %    FL/AC:      22.0   %    20 - 24
 HUM:      34.9  mm     G. Age:  22w 0d         68  %
 CER:        23  mm     G. Age:  21w 4d         61  %
 LV:        6.8  mm
 CM:        4.2  mm
 Est. FW:     426  gm    0 lb 15 oz      51  %
OB History

 Gravidity:    2         Term:   1
 Living:       1
Gestational Age

 LMP:           21w 1d        Date:  11/14/18                 EDD:   08/21/19
 U/S Today:     21w 5d                                        EDD:   08/17/19
 Best:          21w 1d     Det. By:  LMP  (11/14/18)          EDD:   08/21/19
Anatomy

 Cranium:               Appears normal         LVOT:                   Appears normal
 Cavum:                 Appears normal         Aortic Arch:            Appears normal
 Ventricles:            Appears normal         Ductal Arch:            Appears normal
 Choroid Plexus:        Appears normal         Diaphragm:              Appears normal
 Cerebellum:            Appears normal         Stomach:                Appears normal, left
                                                                       sided
 Posterior Fossa:       Appears normal         Abdomen:                Appears normal
 Nuchal Fold:           Not applicable (>20    Abdominal Wall:         Appears nml (cord
                        wks GA)                                        insert, abd wall)
 Face:                  Appears normal         Cord Vessels:           Appears normal (3
                        (orbits and profile)                           vessel cord)
 Lips:                  Appears normal         Kidneys:                Appear normal
 Palate:                Appears normal         Bladder:                Appears normal
 Thoracic:              Appears normal         Spine:                  Appears normal
 Heart:                 Appears normal         Upper Extremities:      Appears normal
                        (4CH, axis, and
                        situs)
 RVOT:                  Appears normal         Lower Extremities:      Appears normal

 Other:  Fetus appears to be a male. Nasal bone visualized. Open hands
         visualized. 3VV visualized.
Cervix Uterus Adnexa

 Cervix
 Length:           4.18  cm.
 Normal appearance by transabdominal scan.

 Left Ovary
 Within normal limits.

 Right Ovary
 Within normal limits.
Impression

 Normal interval growth.  No ultrasonic evidence of structural
 fetal anomalies.
Recommendations

 Follow up as clinically indicated.

## 2019-06-01 ENCOUNTER — Other Ambulatory Visit: Payer: Self-pay | Admitting: *Deleted

## 2019-06-01 ENCOUNTER — Telehealth: Payer: Self-pay | Admitting: Obstetrics & Gynecology

## 2019-06-01 DIAGNOSIS — O0990 Supervision of high risk pregnancy, unspecified, unspecified trimester: Secondary | ICD-10-CM

## 2019-06-01 NOTE — Telephone Encounter (Signed)
°  Attempted to call patient to ask her about any symptoms, and to wear her mask the whole visit covering her nose and mouth. Also, to sanitize her hands upon arriving. A message was left.

## 2019-06-04 ENCOUNTER — Other Ambulatory Visit: Payer: Self-pay

## 2019-06-04 ENCOUNTER — Ambulatory Visit (INDEPENDENT_AMBULATORY_CARE_PROVIDER_SITE_OTHER): Payer: BC Managed Care – PPO | Admitting: Obstetrics & Gynecology

## 2019-06-04 DIAGNOSIS — O0990 Supervision of high risk pregnancy, unspecified, unspecified trimester: Secondary | ICD-10-CM

## 2019-06-04 DIAGNOSIS — Z23 Encounter for immunization: Secondary | ICD-10-CM

## 2019-06-04 DIAGNOSIS — O0993 Supervision of high risk pregnancy, unspecified, third trimester: Secondary | ICD-10-CM

## 2019-06-04 DIAGNOSIS — Z3A28 28 weeks gestation of pregnancy: Secondary | ICD-10-CM

## 2019-06-04 NOTE — Patient Instructions (Signed)

## 2019-06-04 NOTE — Progress Notes (Signed)
   PRENATAL VISIT NOTE  Subjective:  Christie Ali is a 33 y.o. G2P1001 at [redacted]w[redacted]d being seen today for ongoing prenatal care.  She is currently monitored for the following issues for this high-risk pregnancy and has Asthma; HSV-2 infection; History of gestational hypertension; Supervision of high risk pregnancy, antepartum; Short interval between pregnancies affecting pregnancy, antepartum; and Hx of fetal anomaly in prior pregnancy, currently pregnant on their problem list.  Patient reports no complaints.  Contractions: Not present. Vag. Bleeding: None.  Movement: Present. Denies leaking of fluid.   The following portions of the patient's history were reviewed and updated as appropriate: allergies, current medications, past family history, past medical history, past social history, past surgical history and problem list.   Objective:   Vitals:   06/04/19 0918  BP: 121/75  Pulse: 95  Weight: 191 lb (86.6 kg)    Fetal Status: Fetal Heart Rate (bpm): 136   Movement: Present     General:  Alert, oriented and cooperative. Patient is in no acute distress.  Skin: Skin is warm and dry. No rash noted.   Cardiovascular: Normal heart rate noted  Respiratory: Normal respiratory effort, no problems with respiration noted  Abdomen: Soft, gravid, appropriate for gestational age.  Pain/Pressure: Absent     Pelvic: Cervical exam deferred        Extremities: Normal range of motion.  Edema: None  Mental Status: Normal mood and affect. Normal behavior. Normal judgment and thought content.   Assessment and Plan:  Pregnancy: G2P1001 at [redacted]w[redacted]d 1. Supervision of high risk pregnancy, antepartum Nl BP, routine labs today  Preterm labor symptoms and general obstetric precautions including but not limited to vaginal bleeding, contractions, leaking of fluid and fetal movement were reviewed in detail with the patient. Please refer to After Visit Summary for other counseling recommendations.   Return in  about 4 weeks (around 07/02/2019).  No future appointments.  Emeterio Reeve, MD

## 2019-06-05 LAB — CBC
Hematocrit: 37.6 % (ref 34.0–46.6)
Hemoglobin: 12.9 g/dL (ref 11.1–15.9)
MCH: 32.4 pg (ref 26.6–33.0)
MCHC: 34.3 g/dL (ref 31.5–35.7)
MCV: 95 fL (ref 79–97)
Platelets: 214 10*3/uL (ref 150–450)
RBC: 3.98 x10E6/uL (ref 3.77–5.28)
RDW: 11.9 % (ref 11.7–15.4)
WBC: 14.4 10*3/uL — ABNORMAL HIGH (ref 3.4–10.8)

## 2019-06-05 LAB — GLUCOSE TOLERANCE, 2 HOURS W/ 1HR
Glucose, 1 hour: 164 mg/dL (ref 65–179)
Glucose, 2 hour: 97 mg/dL (ref 65–152)
Glucose, Fasting: 86 mg/dL (ref 65–91)

## 2019-06-05 LAB — HIV ANTIBODY (ROUTINE TESTING W REFLEX): HIV Screen 4th Generation wRfx: NONREACTIVE

## 2019-06-05 LAB — RPR: RPR Ser Ql: NONREACTIVE

## 2019-06-07 ENCOUNTER — Telehealth (INDEPENDENT_AMBULATORY_CARE_PROVIDER_SITE_OTHER): Payer: BC Managed Care – PPO | Admitting: *Deleted

## 2019-06-07 DIAGNOSIS — O0990 Supervision of high risk pregnancy, unspecified, unspecified trimester: Secondary | ICD-10-CM

## 2019-06-07 NOTE — Telephone Encounter (Signed)
Called pt regarding babyscripts.  Per review, pt has never logged a blood pressure.  Pt states that she does have a blood pressure cuff and is checking her blood pressures and writing them down.  Pt states that she is unable to log into babyscripts and needs to reset her password.  Password reset through babyscripts and email sent to pt to do this.

## 2019-07-02 ENCOUNTER — Encounter: Payer: Self-pay | Admitting: Obstetrics & Gynecology

## 2019-07-02 ENCOUNTER — Other Ambulatory Visit: Payer: Self-pay

## 2019-07-02 ENCOUNTER — Telehealth (INDEPENDENT_AMBULATORY_CARE_PROVIDER_SITE_OTHER): Payer: BC Managed Care – PPO | Admitting: Obstetrics & Gynecology

## 2019-07-02 DIAGNOSIS — O09299 Supervision of pregnancy with other poor reproductive or obstetric history, unspecified trimester: Secondary | ICD-10-CM

## 2019-07-02 DIAGNOSIS — Z3A32 32 weeks gestation of pregnancy: Secondary | ICD-10-CM

## 2019-07-02 DIAGNOSIS — O0990 Supervision of high risk pregnancy, unspecified, unspecified trimester: Secondary | ICD-10-CM

## 2019-07-02 DIAGNOSIS — O09293 Supervision of pregnancy with other poor reproductive or obstetric history, third trimester: Secondary | ICD-10-CM

## 2019-07-02 DIAGNOSIS — O09899 Supervision of other high risk pregnancies, unspecified trimester: Secondary | ICD-10-CM

## 2019-07-02 DIAGNOSIS — O0993 Supervision of high risk pregnancy, unspecified, third trimester: Secondary | ICD-10-CM

## 2019-07-02 NOTE — Progress Notes (Signed)
0900 I called Christie Ali for her virtual visit. I left a message I was calling for her virtual visit and will call back closer to her appointment time.  Linda,RN I connected with  Christie Ali on 07/02/19 at  9:35 AM EDT by telephone and verified that I am speaking with the correct person using two identifiers.   I discussed the limitations, risks, security and privacy concerns of performing an evaluation and management service by telephone and virtually and the availability of in person appointments. I also discussed with the patient that there may be a patient responsible charge related to this service. The patient expressed understanding and agreed to proceed.  She has not been active in babyscripts per what RN can see, but Christie Ali states she is taking her bp and logging it in. . C/o pressure / soreness right pelvic . C/o last night at top of stomach when lays certain way feels a pain.  Linda,RN 07/02/2019  9:47 AM

## 2019-07-02 NOTE — Patient Instructions (Signed)

## 2019-07-02 NOTE — Progress Notes (Signed)
   TELEHEALTH VIRTUAL OBSTETRICS VISIT ENCOUNTER NOTE  I connected with Christie Ali on 07/02/19 at  9:35 AM EDT by telephone at home and verified that I am speaking with the correct person using two identifiers.   I discussed the limitations, risks, security and privacy concerns of performing an evaluation and management service by telephone and the availability of in person appointments. I also discussed with the patient that there may be a patient responsible charge related to this service. The patient expressed understanding and agreed to proceed.  Subjective:  Christie Ali is a 33 y.o. G2P1001 at [redacted]w[redacted]d being followed for ongoing prenatal care.  She is currently monitored for the following issues for this low-risk pregnancy and has Asthma; HSV-2 infection; History of gestational hypertension; Supervision of high risk pregnancy, antepartum; Short interval between pregnancies affecting pregnancy, antepartum; and Hx of fetal anomaly in prior pregnancy, currently pregnant on their problem list.  Patient reports occasional contractions. Reports fetal movement. Denies any contractions, bleeding or leaking of fluid.   The following portions of the patient's history were reviewed and updated as appropriate: allergies, current medications, past family history, past medical history, past social history, past surgical history and problem list.   Objective:   General:  Alert, oriented and cooperative.   Mental Status: Normal mood and affect perceived. Normal judgment and thought content.  Rest of physical exam deferred due to type of encounter  Assessment and Plan:  Pregnancy: G2P1001 at [redacted]w[redacted]d 1. Supervision of high risk pregnancy, antepartum Doing well  2. Short interval between pregnancies affecting pregnancy, antepartum   3. Hx of fetal anomaly in prior pregnancy, currently pregnant Nl [place]  Preterm labor symptoms and general obstetric precautions including but not limited to vaginal  bleeding, contractions, leaking of fluid and fetal movement were reviewed in detail with the patient.  I discussed the assessment and treatment plan with the patient. The patient was provided an opportunity to ask questions and all were answered. The patient agreed with the plan and demonstrated an understanding of the instructions. The patient was advised to call back or seek an in-person office evaluation/go to [facility]U at [facility] for any urgent or concerning symptoms. Please refer to After Visit Summary for other counseling recommendations.   I provided 10 minutes of non-face-to-face time during this encounter.  Return in about 29 days (around 07/31/2019) for in person GBS.  No future appointments.  Emeterio Reeve, MD Center for [place], [place]

## 2019-07-05 DIAGNOSIS — B379 Candidiasis, unspecified: Secondary | ICD-10-CM

## 2019-07-06 MED ORDER — TERCONAZOLE 0.4 % VA CREA: 1.0000 | TOPICAL_CREAM | Freq: Every day | VAGINAL | 0 refills | Status: DC

## 2019-07-17 ENCOUNTER — Other Ambulatory Visit: Payer: Self-pay | Admitting: General Practice

## 2019-07-17 DIAGNOSIS — B009 Herpesviral infection, unspecified: Secondary | ICD-10-CM

## 2019-07-17 DIAGNOSIS — O09299 Supervision of pregnancy with other poor reproductive or obstetric history, unspecified trimester: Secondary | ICD-10-CM

## 2019-07-17 MED ORDER — VALACYCLOVIR HCL 500 MG PO TABS: 500.0000 mg | ORAL_TABLET | Freq: Two times a day (BID) | ORAL | 1 refills | Status: DC

## 2019-07-31 ENCOUNTER — Ambulatory Visit (INDEPENDENT_AMBULATORY_CARE_PROVIDER_SITE_OTHER): Payer: BC Managed Care – PPO | Admitting: Obstetrics and Gynecology

## 2019-07-31 ENCOUNTER — Other Ambulatory Visit: Payer: Self-pay

## 2019-07-31 ENCOUNTER — Encounter: Payer: Self-pay | Admitting: Obstetrics and Gynecology

## 2019-07-31 VITALS — BP 132/80 | HR 86 | Temp 97.9°F | Wt 198.4 lb

## 2019-07-31 DIAGNOSIS — O0990 Supervision of high risk pregnancy, unspecified, unspecified trimester: Secondary | ICD-10-CM

## 2019-07-31 DIAGNOSIS — Z3A37 37 weeks gestation of pregnancy: Secondary | ICD-10-CM

## 2019-07-31 DIAGNOSIS — O0993 Supervision of high risk pregnancy, unspecified, third trimester: Secondary | ICD-10-CM

## 2019-07-31 DIAGNOSIS — Z113 Encounter for screening for infections with a predominantly sexual mode of transmission: Secondary | ICD-10-CM

## 2019-07-31 DIAGNOSIS — Z8759 Personal history of other complications of pregnancy, childbirth and the puerperium: Secondary | ICD-10-CM

## 2019-07-31 LAB — OB RESULTS CONSOLE GBS: GBS: POSITIVE

## 2019-07-31 LAB — OB RESULTS CONSOLE GC/CHLAMYDIA: Gonorrhea: NEGATIVE

## 2019-07-31 NOTE — Patient Instructions (Signed)
Third Trimester of Pregnancy The third trimester is from week 28 through week 40 (months 7 through 9). The third trimester is a time when the unborn baby (fetus) is growing rapidly. At the end of the ninth month, the fetus is about 20 inches in length and weighs 6-10 pounds. Body changes during your third trimester Your body will continue to go through many changes during pregnancy. The changes vary from woman to woman. During the third trimester:  Your weight will continue to increase. You can expect to gain 25-35 pounds (11-16 kg) by the end of the pregnancy.  You may begin to get stretch marks on your hips, abdomen, and breasts.  You may urinate more often because the fetus is moving lower into your pelvis and pressing on your bladder.  You may develop or continue to have heartburn. This is caused by increased hormones that slow down muscles in the digestive tract.  You may develop or continue to have constipation because increased hormones slow digestion and cause the muscles that push waste through your intestines to relax.  You may develop hemorrhoids. These are swollen veins (varicose veins) in the rectum that can itch or be painful.  You may develop swollen, bulging veins (varicose veins) in your legs.  You may have increased body aches in the pelvis, back, or thighs. This is due to weight gain and increased hormones that are relaxing your joints.  You may have changes in your hair. These can include thickening of your hair, rapid growth, and changes in texture. Some women also have hair loss during or after pregnancy, or hair that feels dry or thin. Your hair will most likely return to normal after your baby is born.  Your breasts will continue to grow and they will continue to become tender. A yellow fluid (colostrum) may leak from your breasts. This is the first milk you are producing for your baby.  Your belly button may stick out.  You may notice more swelling in your hands,  face, or ankles.  You may have increased tingling or numbness in your hands, arms, and legs. The skin on your belly may also feel numb.  You may feel short of breath because of your expanding uterus.  You may have more problems sleeping. This can be caused by the size of your belly, increased need to urinate, and an increase in your body's metabolism.  You may notice the fetus "dropping," or moving lower in your abdomen (lightening).  You may have increased vaginal discharge.  You may notice your joints feel loose and you may have pain around your pelvic bone. What to expect at prenatal visits You will have prenatal exams every 2 weeks until week 36. Then you will have weekly prenatal exams. During a routine prenatal visit:  You will be weighed to make sure you and the baby are growing normally.  Your blood pressure will be taken.  Your abdomen will be measured to track your baby's growth.  The fetal heartbeat will be listened to.  Any test results from the previous visit will be discussed.  You may have a cervical check near your due date to see if your cervix has softened or thinned (effaced).  You will be tested for Group B streptococcus. This happens between 35 and 37 weeks. Your health care provider may ask you:  What your birth plan is.  How you are feeling.  If you are feeling the baby move.  If you have had any abnormal   symptoms, such as leaking fluid, bleeding, severe headaches, or abdominal cramping.  If you are using any tobacco products, including cigarettes, chewing tobacco, and electronic cigarettes.  If you have any questions. Other tests or screenings that may be performed during your third trimester include:  Blood tests that check for low iron levels (anemia).  Fetal testing to check the health, activity level, and growth of the fetus. Testing is done if you have certain medical conditions or if there are problems during the pregnancy.  Nonstress test  (NST). This test checks the health of your baby to make sure there are no signs of problems, such as the baby not getting enough oxygen. During this test, a belt is placed around your belly. The baby is made to move, and its heart rate is monitored during movement. What is false labor? False labor is a condition in which you feel small, irregular tightenings of the muscles in the womb (contractions) that usually go away with rest, changing position, or drinking water. These are called Braxton Hicks contractions. Contractions may last for hours, days, or even weeks before true labor sets in. If contractions come at regular intervals, become more frequent, increase in intensity, or become painful, you should see your health care provider. What are the signs of labor?  Abdominal cramps.  Regular contractions that start at 10 minutes apart and become stronger and more frequent with time.  Contractions that start on the top of the uterus and spread down to the lower abdomen and back.  Increased pelvic pressure and dull back pain.  A watery or bloody mucus discharge that comes from the vagina.  Leaking of amniotic fluid. This is also known as your "water breaking." It could be a slow trickle or a gush. Let your health care provider know if it has a color or strange odor. If you have any of these signs, call your health care provider right away, even if it is before your due date. Follow these instructions at home: Medicines  Follow your health care provider's instructions regarding medicine use. Specific medicines may be either safe or unsafe to take during pregnancy.  Take a prenatal vitamin that contains at least 600 micrograms (mcg) of folic acid.  If you develop constipation, try taking a stool softener if your health care provider approves. Eating and drinking   Eat a balanced diet that includes fresh fruits and vegetables, whole grains, good sources of protein such as meat, eggs, or tofu,  and low-fat dairy. Your health care provider will help you determine the amount of weight gain that is right for you.  Avoid raw meat and uncooked cheese. These carry germs that can cause birth defects in the baby.  If you have low calcium intake from food, talk to your health care provider about whether you should take a daily calcium supplement.  Eat four or five small meals rather than three large meals a day.  Limit foods that are high in fat and processed sugars, such as fried and sweet foods.  To prevent constipation: ? Drink enough fluid to keep your urine clear or pale yellow. ? Eat foods that are high in fiber, such as fresh fruits and vegetables, whole grains, and beans. Activity  Exercise only as directed by your health care provider. Most women can continue their usual exercise routine during pregnancy. Try to exercise for 30 minutes at least 5 days a week. Stop exercising if you experience uterine contractions.  Avoid heavy lifting.  Do   not exercise in extreme heat or humidity, or at high altitudes.  Wear low-heel, comfortable shoes.  Practice good posture.  You may continue to have sex unless your health care provider tells you otherwise. Relieving pain and discomfort  Take frequent breaks and rest with your legs elevated if you have leg cramps or low back pain.  Take warm sitz baths to soothe any pain or discomfort caused by hemorrhoids. Use hemorrhoid cream if your health care provider approves.  Wear a good support bra to prevent discomfort from breast tenderness.  If you develop varicose veins: ? Wear support pantyhose or compression stockings as told by your healthcare provider. ? Elevate your feet for 15 minutes, 3-4 times a day. Prenatal care  Write down your questions. Take them to your prenatal visits.  Keep all your prenatal visits as told by your health care provider. This is important. Safety  Wear your seat belt at all times when driving.  Make  a list of emergency phone numbers, including numbers for family, friends, the hospital, and police and fire departments. General instructions  Avoid cat litter boxes and soil used by cats. These carry germs that can cause birth defects in the baby. If you have a cat, ask someone to clean the litter box for you.  Do not travel far distances unless it is absolutely necessary and only with the approval of your health care provider.  Do not use hot tubs, steam rooms, or saunas.  Do not drink alcohol.  Do not use any products that contain nicotine or tobacco, such as cigarettes and e-cigarettes. If you need help quitting, ask your health care provider.  Do not use any medicinal herbs or unprescribed drugs. These chemicals affect the formation and growth of the baby.  Do not douche or use tampons or scented sanitary pads.  Do not cross your legs for long periods of time.  To prepare for the arrival of your baby: ? Take prenatal classes to understand, practice, and ask questions about labor and delivery. ? Make a trial run to the hospital. ? Visit the hospital and tour the maternity area. ? Arrange for maternity or paternity leave through employers. ? Arrange for family and friends to take care of pets while you are in the hospital. ? Purchase a rear-facing car seat and make sure you know how to install it in your car. ? Pack your hospital bag. ? Prepare the baby's nursery. Make sure to remove all pillows and stuffed animals from the baby's crib to prevent suffocation.  Visit your dentist if you have not gone during your pregnancy. Use a soft toothbrush to brush your teeth and be gentle when you floss. Contact a health care provider if:  You are unsure if you are in labor or if your water has broken.  You become dizzy.  You have mild pelvic cramps, pelvic pressure, or nagging pain in your abdominal area.  You have lower back pain.  You have persistent nausea, vomiting, or diarrhea.   You have an unusual or bad smelling vaginal discharge.  You have pain when you urinate. Get help right away if:  Your water breaks before 37 weeks.  You have regular contractions less than 5 minutes apart before 37 weeks.  You have a fever.  You are leaking fluid from your vagina.  You have spotting or bleeding from your vagina.  You have severe abdominal pain or cramping.  You have rapid weight loss or weight gain.  You have  shortness of breath with chest pain.  You notice sudden or extreme swelling of your face, hands, ankles, feet, or legs.  Your baby makes fewer than 10 movements in 2 hours.  You have severe headaches that do not go away when you take medicine.  You have vision changes. Summary  The third trimester is from week 28 through week 40, months 7 through 9. The third trimester is a time when the unborn baby (fetus) is growing rapidly.  During the third trimester, your discomfort may increase as you and your baby continue to gain weight. You may have abdominal, leg, and back pain, sleeping problems, and an increased need to urinate.  During the third trimester your breasts will keep growing and they will continue to become tender. A yellow fluid (colostrum) may leak from your breasts. This is the first milk you are producing for your baby.  False labor is a condition in which you feel small, irregular tightenings of the muscles in the womb (contractions) that eventually go away. These are called Braxton Hicks contractions. Contractions may last for hours, days, or even weeks before true labor sets in.  Signs of labor can include: abdominal cramps; regular contractions that start at 10 minutes apart and become stronger and more frequent with time; watery or bloody mucus discharge that comes from the vagina; increased pelvic pressure and dull back pain; and leaking of amniotic fluid. This information is not intended to replace advice given to you by your health  care provider. Make sure you discuss any questions you have with your health care provider. Document Released: 11/30/2001 Document Revised: 03/29/2019 Document Reviewed: 01/11/2017 Elsevier Patient Education  2020 [employer].

## 2019-07-31 NOTE — Progress Notes (Signed)
Subjective:  Christie Ali is a 33 y.o. G2P1001 at [redacted]w[redacted]d being seen today for ongoing prenatal care.  She is currently monitored for the following issues for this high-risk pregnancy and has Asthma; HSV-2 infection; History of gestational hypertension; Supervision of high risk pregnancy, antepartum; Short interval between pregnancies affecting pregnancy, antepartum; and Hx of fetal anomaly in prior pregnancy, currently pregnant on their problem list.  Patient reports general discomforts of pregnancy.  Contractions: Not present. Vag. Bleeding: None.  Movement: Present. Denies leaking of fluid.   The following portions of the patient's history were reviewed and updated as appropriate: allergies, current medications, past family history, past medical history, past social history, past surgical history and problem list. Problem list updated.  Objective:   Vitals:   07/31/19 0921  BP: 132/80  Pulse: 86  Temp: 97.9 F (36.6 C)  Weight: 198 lb 6.4 oz (90 kg)    Fetal Status: Fetal Heart Rate (bpm): 134   Movement: Present     General:  Alert, oriented and cooperative. Patient is in no acute distress.  Skin: Skin is warm and dry. No rash noted.   Cardiovascular: Normal heart rate noted  Respiratory: Normal respiratory effort, no problems with respiration noted  Abdomen: Soft, gravid, appropriate for gestational age. Pain/Pressure: Present     Pelvic:  Cervical exam performed        Extremities: Normal range of motion.  Edema: None  Mental Status: Normal mood and affect. Normal behavior. Normal judgment and thought content.   Urinalysis:      Assessment and Plan:  Pregnancy: G2P1001 at [redacted]w[redacted]d  1. Supervision of high risk pregnancy, antepartum Labor precautions - Culture, beta strep (group b only) - GC/Chlamydia probe amp ([place])not at [facility]  2. History of gestational hypertension BP stable No S/Sx of PEC  Term labor symptoms and general obstetric precautions including but not  limited to vaginal bleeding, contractions, leaking of fluid and fetal movement were reviewed in detail with the patient. Please refer to After Visit Summary for other counseling recommendations.  Return in about 1 week (around 08/07/2019) for OB visit, face to face.   Chancy Milroy, MD

## 2019-08-01 LAB — GC/CHLAMYDIA PROBE AMP (~~LOC~~) NOT AT ARMC
Chlamydia: NEGATIVE
Neisseria Gonorrhea: NEGATIVE

## 2019-08-04 LAB — CULTURE, BETA STREP (GROUP B ONLY): Strep Gp B Culture: POSITIVE — AB

## 2019-08-07 ENCOUNTER — Telehealth: Payer: Self-pay | Admitting: Family Medicine

## 2019-08-07 NOTE — Telephone Encounter (Signed)
Called and spoke to patient , she is aware of her appointment here in the office on 08-08-2019, she was instructed to wear a face mask and no visitors are not allowed.

## 2019-08-08 ENCOUNTER — Other Ambulatory Visit: Payer: Self-pay

## 2019-08-08 ENCOUNTER — Ambulatory Visit (INDEPENDENT_AMBULATORY_CARE_PROVIDER_SITE_OTHER): Payer: BC Managed Care – PPO | Admitting: Family Medicine

## 2019-08-08 VITALS — BP 119/77 | HR 74 | Wt 198.0 lb

## 2019-08-08 DIAGNOSIS — O98813 Other maternal infectious and parasitic diseases complicating pregnancy, third trimester: Secondary | ICD-10-CM

## 2019-08-08 DIAGNOSIS — B009 Herpesviral infection, unspecified: Secondary | ICD-10-CM

## 2019-08-08 DIAGNOSIS — O09899 Supervision of other high risk pregnancies, unspecified trimester: Secondary | ICD-10-CM

## 2019-08-08 DIAGNOSIS — Z3A38 38 weeks gestation of pregnancy: Secondary | ICD-10-CM

## 2019-08-08 DIAGNOSIS — O0990 Supervision of high risk pregnancy, unspecified, unspecified trimester: Secondary | ICD-10-CM

## 2019-08-08 DIAGNOSIS — Z8759 Personal history of other complications of pregnancy, childbirth and the puerperium: Secondary | ICD-10-CM

## 2019-08-08 NOTE — Patient Instructions (Signed)

## 2019-08-08 NOTE — Progress Notes (Signed)
   Subjective:  Christie Ali is a 33 y.o. G2P1001 at [redacted]w[redacted]d being seen today for ongoing prenatal care.  She is currently monitored for the following issues for this high-risk pregnancy and has Asthma; HSV-2 infection; History of gestational hypertension; Supervision of high risk pregnancy, antepartum; Short interval between pregnancies affecting pregnancy, antepartum; and Hx of fetal anomaly in prior pregnancy, currently pregnant on their problem list.  Patient reports no complaints.  Contractions: Not present. Vag. Bleeding: None.  Movement: Present. Denies leaking of fluid.   The following portions of the patient's history were reviewed and updated as appropriate: allergies, current medications, past family history, past medical history, past social history, past surgical history and problem list. Problem list updated.  Objective:   Vitals:   08/08/19 0823  BP: 119/77  Pulse: 74  Weight: 198 lb (89.8 kg)    Fetal Status: Fetal Heart Rate (bpm): 136 Fundal Height: 38 cm Movement: Present  Presentation: Vertex  General:  Alert, oriented and cooperative. Patient is in no acute distress.  Skin: Skin is warm and dry. No rash noted.   Cardiovascular: Normal heart rate noted  Respiratory: Normal respiratory effort, no problems with respiration noted  Abdomen: Soft, gravid, appropriate for gestational age. Pain/Pressure: Present     Pelvic: Vag. Bleeding: None     Cervical exam performed Dilation: 2 Effacement (%): 30 Station: -2  Extremities: Normal range of motion.  Edema: None  Mental Status: Normal mood and affect. Normal behavior. Normal judgment and thought content.   Urinalysis:      Assessment and Plan:  Pregnancy: G2P1001 at [redacted]w[redacted]d  1. Supervision of high risk pregnancy, antepartum Discussed GBS positive result Has BP cuff at home but prefers in person visit due to poor internet connection F/u 1 week, offer membrane stripping at that time  2. Short interval between  pregnancies affecting pregnancy, antepartum   3. HSV-2 infection Taking valtrex  4. History of gestational hypertension BP's well controlled  Term labor symptoms and general obstetric precautions including but not limited to vaginal bleeding, contractions, leaking of fluid and fetal movement were reviewed in detail with the patient. Please refer to After Visit Summary for other counseling recommendations.  Return in 1 week (on 08/15/2019) for in person, [facility].   Clarnce Flock, MD

## 2019-08-14 ENCOUNTER — Telehealth: Payer: Self-pay | Admitting: Obstetrics and Gynecology

## 2019-08-14 NOTE — Telephone Encounter (Signed)
Spoke to patient about her appointment on 8/26 @ 11:15. Patient instructed to wear a face mask for the entire appointment and no visitors are allowed with her during the visit. Patient screened for covid symptoms and denied having any

## 2019-08-15 ENCOUNTER — Encounter (HOSPITAL_COMMUNITY): Payer: Self-pay | Admitting: *Deleted

## 2019-08-15 ENCOUNTER — Other Ambulatory Visit: Payer: Self-pay

## 2019-08-15 ENCOUNTER — Encounter: Payer: Self-pay | Admitting: Obstetrics and Gynecology

## 2019-08-15 ENCOUNTER — Telehealth (HOSPITAL_COMMUNITY): Payer: Self-pay | Admitting: *Deleted

## 2019-08-15 ENCOUNTER — Ambulatory Visit (INDEPENDENT_AMBULATORY_CARE_PROVIDER_SITE_OTHER): Payer: BC Managed Care – PPO | Admitting: Obstetrics and Gynecology

## 2019-08-15 ENCOUNTER — Encounter: Payer: Self-pay | Admitting: General Practice

## 2019-08-15 VITALS — BP 127/76 | HR 80 | Wt 203.0 lb

## 2019-08-15 DIAGNOSIS — B009 Herpesviral infection, unspecified: Secondary | ICD-10-CM

## 2019-08-15 DIAGNOSIS — Z8759 Personal history of other complications of pregnancy, childbirth and the puerperium: Secondary | ICD-10-CM

## 2019-08-15 DIAGNOSIS — Z3A39 39 weeks gestation of pregnancy: Secondary | ICD-10-CM

## 2019-08-15 DIAGNOSIS — O0993 Supervision of high risk pregnancy, unspecified, third trimester: Secondary | ICD-10-CM

## 2019-08-15 DIAGNOSIS — O0990 Supervision of high risk pregnancy, unspecified, unspecified trimester: Secondary | ICD-10-CM

## 2019-08-15 NOTE — Progress Notes (Signed)
Scheduled IOL 9/8 @ 730

## 2019-08-15 NOTE — Telephone Encounter (Signed)
Preadmission screen  

## 2019-08-15 NOTE — Progress Notes (Signed)
   PRENATAL VISIT NOTE  Subjective:  Christie Ali is a 33 y.o. G2P1001 at [redacted]w[redacted]d being seen today for ongoing prenatal care.  She is currently monitored for the following issues for this high-risk pregnancy and has Asthma; HSV-2 infection; History of gestational hypertension; Supervision of high risk pregnancy, antepartum; Short interval between pregnancies affecting pregnancy, antepartum; and Hx of fetal anomaly in prior pregnancy, currently pregnant on their problem list.  Patient reports no complaints.  Contractions: Not present. Vag. Bleeding: None.  Movement: Present. Denies leaking of fluid.   The following portions of the patient's history were reviewed and updated as appropriate: allergies, current medications, past family history, past medical history, past social history, past surgical history and problem list.   Objective:   Vitals:   08/15/19 1122  BP: 127/76  Pulse: 80  Weight: 203 lb (92.1 kg)    Fetal Status: Fetal Heart Rate (bpm): 136   Movement: Present  Presentation: Vertex  General:  Alert, oriented and cooperative. Patient is in no acute distress.  Skin: Skin is warm and dry. No rash noted.   Cardiovascular: Normal heart rate noted  Respiratory: Normal respiratory effort, no problems with respiration noted  Abdomen: Soft, gravid, appropriate for gestational age.  Pain/Pressure: Present     Pelvic: examined Dilation: 3 Effacement (%): 50 Station: -3  Extremities: Normal range of motion.  Edema: None  Mental Status: Normal mood and affect. Normal behavior. Normal judgment and thought content.   Pt informed that the ultrasound is considered a limited OB ultrasound and is not intended to be a complete ultrasound exam.  Patient also informed that the ultrasound is not being completed with the intent of assessing for fetal or placental anomalies or any pelvic abnormalities.  Explained that the purpose of today's ultrasound is to assess for  presentation.  Patient  acknowledges the purpose of the exam and the limitations of the study.    Bedside US: vertex  Assessment and Plan:  Pregnancy: G2P1001 at [redacted]w[redacted]d  1. Supervision of high risk pregnancy, antepartum IOL scheduled for 41 weeks Orders placed today  2. History of gestational hypertension Was on baby ASA earlier in pregnancy, self-discontinued  3. HSV-2 infection No s/s outbreak  Cont valtrex BID until delivery  Term labor symptoms and general obstetric precautions including but not limited to vaginal bleeding, contractions, leaking of fluid and fetal movement were reviewed in detail with the patient. Please refer to After Visit Summary for other counseling recommendations.   Return in about 1 week (around 08/22/2019) for OB visit (MD), in person.  Future Appointments  Date Time Provider [place]  08/22/2019 10:15 AM [facility] NST [facility] WOC  08/22/2019 11:15 AM Woodroe Mode, MD [facility]    Sloan Leiter, MD

## 2019-08-15 NOTE — Patient Instructions (Signed)
Places to have your son circumcised:                                                                      [facility]     [phone] while you are in hospital         [facility]              [phone]   $269 by 4 wks                      [facility]                     [phone]   $269 by 7 days [facility]                    [phone]   $269 by 4 wks [facility]             [phone]   $225 by 2 wks    These prices sometimes change but are roughly what you can expect to pay. Please call and confirm pricing.   Circumcision is considered an elective/non-medically necessary procedure. There are many reasons parents decide to have their sons circumsized. During the first year of life circumcised males have a reduced risk of urinary tract infections but after this year the rates between circumcised males and uncircumcised males are the same.  It is safe to have your son circumcised outside of the hospital and the places above perform them regularly.   Deciding about Circumcision in Baby Boys  (Up-to-date The Basics)  What is circumcision?   Circumcision is a surgery that removes the skin that covers the tip of the penis, called the "foreskin" Circumcision is usually done when a boy is between 75 and 66 days old. In the [place], circumcision is common. In some other countries, fewer boys are circumcised. Circumcision is a common tradition in some religions.  Should I have my baby boy circumcised?   There is no easy answer. Circumcision has some benefits. But it also has risks. After talking with your doctor, you will have to decide for yourself what is right for your family.  What are the benefits of circumcision?   Circumcised boys seem to have slightly lower rates of: ?Urinary tract infections ?Swelling of the opening at the tip of the penis Circumcised men seem to have slightly lower rates of: ?Urinary tract infections ?Swelling of the opening at the tip of the penis ?Penis cancer ?HIV  and other infections that you catch during sex ?Cervical cancer in the women they have sex with Even so, in the [place], the risks of these problems are small - even in boys and men who have not been circumcised. Plus, boys and men who are not circumcised can reduce these extra risks by: ?Cleaning their penis well ?Using condoms during sex  What are the risks of circumcision?  Risks include: ?Bleeding or infection from the surgery ?Damage to or amputation of the penis ?A chance that the doctor will cut off too much or not enough of the foreskin ?A chance that sex won't feel as good later in life Only about 1 out of every 200  circumcisions leads to problems. There is also a chance that your health insurance won't pay for circumcision.  How is circumcision done in baby boys?  First, the baby gets medicine for pain relief. This might be a cream on the skin or a shot into the base of the penis. Next, the doctor cleans the baby's penis well. Then he or she uses special tools to cut off the foreskin. Finally, the doctor wraps a bandage (called gauze) around the baby's penis. If you have your baby circumcised, his doctor or nurse will give you instructions on how to care for him after the surgery. It is important that you follow those instructions carefully.  

## 2019-08-21 ENCOUNTER — Other Ambulatory Visit: Payer: Self-pay | Admitting: Advanced Practice Midwife

## 2019-08-22 ENCOUNTER — Ambulatory Visit: Payer: Self-pay

## 2019-08-22 ENCOUNTER — Other Ambulatory Visit: Payer: Self-pay

## 2019-08-22 ENCOUNTER — Ambulatory Visit (INDEPENDENT_AMBULATORY_CARE_PROVIDER_SITE_OTHER): Payer: BC Managed Care – PPO | Admitting: *Deleted

## 2019-08-22 ENCOUNTER — Ambulatory Visit (INDEPENDENT_AMBULATORY_CARE_PROVIDER_SITE_OTHER): Payer: BC Managed Care – PPO | Admitting: Obstetrics & Gynecology

## 2019-08-22 VITALS — BP 117/75 | HR 89 | Wt 202.4 lb

## 2019-08-22 DIAGNOSIS — O98313 Other infections with a predominantly sexual mode of transmission complicating pregnancy, third trimester: Secondary | ICD-10-CM

## 2019-08-22 DIAGNOSIS — O0993 Supervision of high risk pregnancy, unspecified, third trimester: Secondary | ICD-10-CM

## 2019-08-22 DIAGNOSIS — O480 Post-term pregnancy: Secondary | ICD-10-CM

## 2019-08-22 DIAGNOSIS — O0990 Supervision of high risk pregnancy, unspecified, unspecified trimester: Secondary | ICD-10-CM

## 2019-08-22 DIAGNOSIS — O09899 Supervision of other high risk pregnancies, unspecified trimester: Secondary | ICD-10-CM

## 2019-08-22 DIAGNOSIS — B009 Herpesviral infection, unspecified: Secondary | ICD-10-CM

## 2019-08-22 DIAGNOSIS — Z3A40 40 weeks gestation of pregnancy: Secondary | ICD-10-CM

## 2019-08-22 DIAGNOSIS — O09893 Supervision of other high risk pregnancies, third trimester: Secondary | ICD-10-CM

## 2019-08-22 NOTE — Progress Notes (Signed)
   PRENATAL VISIT NOTE  Subjective:  Christie Ali is a 33 y.o. G2P1001 at [redacted]w[redacted]d being seen today for ongoing prenatal care.  She is currently monitored for the following issues for this high-risk pregnancy and has Asthma; HSV-2 infection; History of gestational hypertension; Supervision of high risk pregnancy, antepartum; Short interval between pregnancies affecting pregnancy, antepartum; and Hx of fetal anomaly in prior pregnancy, currently pregnant on their problem list.  Patient reports occasional contractions and vaginal discharge.  Contractions: Irregular. Vag. Bleeding: None.  Movement: Present. Denies leaking of fluid.   The following portions of the patient's history were reviewed and updated as appropriate: allergies, current medications, past family history, past medical history, past social history, past surgical history and problem list.   Objective:   Vitals:   08/22/19 1040  BP: 117/75  Pulse: 89  Weight: 202 lb 6.4 oz (91.8 kg)    Fetal Status: Fetal Heart Rate (bpm): NST   Movement: Present     General:  Alert, oriented and cooperative. Patient is in no acute distress.  Skin: Skin is warm and dry. No rash noted.   Cardiovascular: Normal heart rate noted  Respiratory: Normal respiratory effort, no problems with respiration noted  Abdomen: Soft, gravid, appropriate for gestational age.  Pain/Pressure: Present     Pelvic: Cervical exam performed        Extremities: Normal range of motion.  Edema: None  Mental Status: Normal mood and affect. Normal behavior. Normal judgment and thought content.   Assessment and Plan:  Pregnancy: G2P1001 at [redacted]w[redacted]d 1. Supervision of high risk pregnancy, antepartum   2. Post term pregnancy, antepartum condition or complication iol 41 weeks  3. HSV-2 infection Acyclovir, no lesions  4. Short interval between pregnancies affecting pregnancy, antepartum   Term labor symptoms and general obstetric precautions including but not  limited to vaginal bleeding, contractions, leaking of fluid and fetal movement were reviewed in detail with the patient. Please refer to After Visit Summary for other counseling recommendations.   Return in about 4 weeks (around 09/19/2019) for postpartum.  Future Appointments  Date Time Provider [facility]  08/26/2019  8:40 AM [facility]-MAU 1 [facility]-INDC None  08/28/2019  7:30 AM MC-LD SCHED ROOM MC-INDC None    Emeterio Reeve, MD

## 2019-08-22 NOTE — Progress Notes (Signed)

## 2019-08-22 NOTE — Progress Notes (Signed)
Pt reports fluid leaking from vagina x3-4 days. She requests Cx exam and sweeping of membranes today.

## 2019-08-22 NOTE — Patient Instructions (Signed)

## 2019-08-23 ENCOUNTER — Encounter: Payer: Self-pay | Admitting: *Deleted

## 2019-08-24 ENCOUNTER — Other Ambulatory Visit: Payer: Self-pay

## 2019-08-24 ENCOUNTER — Inpatient Hospital Stay (HOSPITAL_COMMUNITY)
Admission: AD | Admit: 2019-08-24 | Discharge: 2019-08-26 | DRG: 806 | Disposition: A | Discharge status: Home / Self Care | Payer: BC Managed Care – PPO | Attending: Obstetrics and Gynecology | Admitting: Obstetrics and Gynecology

## 2019-08-24 ENCOUNTER — Encounter (HOSPITAL_COMMUNITY): Payer: Self-pay | Admitting: *Deleted

## 2019-08-24 DIAGNOSIS — F17210 Nicotine dependence, cigarettes, uncomplicated: Secondary | ICD-10-CM | POA: Diagnosis not present

## 2019-08-24 DIAGNOSIS — Z3A40 40 weeks gestation of pregnancy: Secondary | ICD-10-CM

## 2019-08-24 DIAGNOSIS — O0990 Supervision of high risk pregnancy, unspecified, unspecified trimester: Secondary | ICD-10-CM

## 2019-08-24 DIAGNOSIS — O9832 Other infections with a predominantly sexual mode of transmission complicating childbirth: Secondary | ICD-10-CM | POA: Diagnosis not present

## 2019-08-24 DIAGNOSIS — A6000 Herpesviral infection of urogenital system, unspecified: Secondary | ICD-10-CM | POA: Diagnosis not present

## 2019-08-24 DIAGNOSIS — O09299 Supervision of pregnancy with other poor reproductive or obstetric history, unspecified trimester: Secondary | ICD-10-CM

## 2019-08-24 DIAGNOSIS — O99334 Smoking (tobacco) complicating childbirth: Secondary | ICD-10-CM | POA: Diagnosis present

## 2019-08-24 DIAGNOSIS — O09899 Supervision of other high risk pregnancies, unspecified trimester: Secondary | ICD-10-CM

## 2019-08-24 DIAGNOSIS — O480 Post-term pregnancy: Principal | ICD-10-CM | POA: Diagnosis present

## 2019-08-24 DIAGNOSIS — O134 Gestational [pregnancy-induced] hypertension without significant proteinuria, complicating childbirth: Secondary | ICD-10-CM | POA: Diagnosis not present

## 2019-08-24 DIAGNOSIS — Z20828 Contact with and (suspected) exposure to other viral communicable diseases: Secondary | ICD-10-CM | POA: Diagnosis not present

## 2019-08-24 DIAGNOSIS — O99824 Streptococcus B carrier state complicating childbirth: Secondary | ICD-10-CM | POA: Diagnosis not present

## 2019-08-24 DIAGNOSIS — O26893 Other specified pregnancy related conditions, third trimester: Secondary | ICD-10-CM | POA: Diagnosis not present

## 2019-08-24 LAB — CBC
HCT: 38.4 % (ref 36.0–46.0)
Hemoglobin: 13.2 g/dL (ref 12.0–15.0)
MCH: 33.2 pg (ref 26.0–34.0)
MCHC: 34.4 g/dL (ref 30.0–36.0)
MCV: 96.7 fL (ref 80.0–100.0)
Platelets: 208 10*3/uL (ref 150–400)
RBC: 3.97 MIL/uL (ref 3.87–5.11)
RDW: 12.7 % (ref 11.5–15.5)
WBC: 16.3 10*3/uL — ABNORMAL HIGH (ref 4.0–10.5)
nRBC: 0 % (ref 0.0–0.2)

## 2019-08-24 LAB — SARS CORONAVIRUS 2 BY RT PCR (HOSPITAL ORDER, PERFORMED IN ~~LOC~~ HOSPITAL LAB): SARS Coronavirus 2: NEGATIVE

## 2019-08-24 LAB — TYPE AND SCREEN
ABO/RH(D): B POS
Antibody Screen: NEGATIVE

## 2019-08-24 LAB — POCT FERN TEST: POCT Fern Test: POSITIVE

## 2019-08-24 MED ORDER — VALACYCLOVIR HCL 500 MG PO TABS
500.0000 mg | ORAL_TABLET | Freq: Two times a day (BID) | ORAL | Status: DC
  Administered 2019-08-24 – 2019-08-26 (×4): 500 mg via ORAL
  Filled 2019-08-24 (×4): qty 1

## 2019-08-24 MED ORDER — LACTATED RINGERS IV SOLN: 500.0000 mL | INTRAVENOUS | Status: DC | PRN

## 2019-08-24 MED ORDER — SODIUM CHLORIDE 0.9 % IV SOLN
5.0000 10*6.[IU] | Freq: Once | INTRAVENOUS | Status: AC
  Administered 2019-08-24: 5 10*6.[IU] via INTRAVENOUS
  Filled 2019-08-24: qty 5

## 2019-08-24 MED ORDER — LACTATED RINGERS IV SOLN
INTRAVENOUS | Status: DC
  Administered 2019-08-24 – 2019-08-25 (×2): via INTRAVENOUS

## 2019-08-24 MED ORDER — OXYTOCIN 40 UNITS IN NORMAL SALINE INFUSION - SIMPLE MED
1.0000 m[IU]/min | INTRAVENOUS | Status: DC
  Administered 2019-08-24: 2 m[IU]/min via INTRAVENOUS
  Filled 2019-08-24: qty 1000

## 2019-08-24 MED ORDER — PENICILLIN G 3 MILLION UNITS IVPB - SIMPLE MED
3.0000 10*6.[IU] | INTRAVENOUS | Status: DC
  Administered 2019-08-24 – 2019-08-25 (×3): 3 10*6.[IU] via INTRAVENOUS
  Filled 2019-08-24 (×3): qty 100

## 2019-08-24 MED ORDER — EPHEDRINE 5 MG/ML INJ: 10.0000 mg | INTRAVENOUS | Status: DC | PRN

## 2019-08-24 MED ORDER — LACTATED RINGERS IV SOLN: INTRAVENOUS | Status: DC

## 2019-08-24 MED ORDER — OXYTOCIN 40 UNITS IN NORMAL SALINE INFUSION - SIMPLE MED
2.5000 [IU]/h | INTRAVENOUS | Status: DC
  Administered 2019-08-25: 2.5 [IU]/h via INTRAVENOUS

## 2019-08-24 MED ORDER — TERBUTALINE SULFATE 1 MG/ML IJ SOLN: 0.2500 mg | Freq: Once | INTRAMUSCULAR | Status: DC | PRN

## 2019-08-24 MED ORDER — ONDANSETRON HCL 4 MG/2ML IJ SOLN
4.0000 mg | Freq: Four times a day (QID) | INTRAMUSCULAR | Status: DC | PRN
  Administered 2019-08-25: 4 mg via INTRAVENOUS
  Filled 2019-08-24: qty 2

## 2019-08-24 MED ORDER — OXYTOCIN BOLUS FROM INFUSION
500.0000 mL | Freq: Once | INTRAVENOUS | Status: AC
  Administered 2019-08-25: 500 mL via INTRAVENOUS

## 2019-08-24 MED ORDER — OXYCODONE-ACETAMINOPHEN 5-325 MG PO TABS: 2.0000 | ORAL_TABLET | ORAL | Status: DC | PRN

## 2019-08-24 MED ORDER — OXYCODONE-ACETAMINOPHEN 5-325 MG PO TABS: 1.0000 | ORAL_TABLET | ORAL | Status: DC | PRN

## 2019-08-24 MED ORDER — SOD CITRATE-CITRIC ACID 500-334 MG/5ML PO SOLN: 30.0000 mL | ORAL | Status: DC | PRN

## 2019-08-24 MED ORDER — PHENYLEPHRINE 40 MCG/ML (10ML) SYRINGE FOR IV PUSH (FOR BLOOD PRESSURE SUPPORT)
80.0000 ug | PREFILLED_SYRINGE | INTRAVENOUS | Status: DC | PRN
  Filled 2019-08-24: qty 10

## 2019-08-24 MED ORDER — FENTANYL-BUPIVACAINE-NACL 0.5-0.125-0.9 MG/250ML-% EP SOLN
12.0000 mL/h | EPIDURAL | Status: DC | PRN
  Filled 2019-08-24: qty 250

## 2019-08-24 MED ORDER — LIDOCAINE HCL (PF) 1 % IJ SOLN: 30.0000 mL | INTRAMUSCULAR | Status: DC | PRN

## 2019-08-24 MED ORDER — ACETAMINOPHEN 325 MG PO TABS: 650.0000 mg | ORAL_TABLET | ORAL | Status: DC | PRN

## 2019-08-24 MED ORDER — DIPHENHYDRAMINE HCL 50 MG/ML IJ SOLN: 12.5000 mg | INTRAMUSCULAR | Status: DC | PRN

## 2019-08-24 MED ORDER — PHENYLEPHRINE 40 MCG/ML (10ML) SYRINGE FOR IV PUSH (FOR BLOOD PRESSURE SUPPORT): 80.0000 ug | PREFILLED_SYRINGE | INTRAVENOUS | Status: DC | PRN

## 2019-08-24 MED ORDER — LACTATED RINGERS IV SOLN
500.0000 mL | Freq: Once | INTRAVENOUS | Status: AC
  Administered 2019-08-24: 500 mL via INTRAVENOUS

## 2019-08-24 NOTE — MAU Note (Signed)
Been seeing pieces of her mucous plug coming out.  This afternoon was sitting on the bed, all of the sudden felt really wet, when she stood up, there was a wet spot,  Continues with little spirts. No bleeding.  No pain.

## 2019-08-24 NOTE — Progress Notes (Signed)
Fair, MD, at bedside. Pt may have a light laboring diet. Pitocin is to be initiated an hour after pt eats per MD.

## 2019-08-24 NOTE — H&P (Addendum)
OBSTETRIC ADMISSION HISTORY AND PHYSICAL  Christie Ali is a 33 y.o. female G2P1001 with IUP at [redacted]w[redacted]d by LMP presenting for SROM.  Approx 4cm dilated a few days at clinic visit. Had urinary frequency this morning. After sitting on bed later in the afternoon noticed spot of fluid on the bed. Clear liquid. Did not feel gush of fluid leaking. Followed by mucus plug which was bloody tinged. Called MAU who advised pt to come into hospital for assessment. Reports good fetal movement. Denies vaginal bleeding or discharge. Denies contractions currently. Denies headache, visual changes, chest pain, SOB, cough, fevers or RUQ pain. Would like to breast feed baby. Would like OP circumcision and depo for contraception. Vertex presentation confirmed in MAU by [place].   She received her prenatal care at [facility].  Support person in labor: FOB-Tony  Ultrasounds . Anatomy U/S:normal  Prenatal History/Complications: [id number] 1 term pregnancy, [redacted]w[redacted]d. 3470g, SVD, epidural  -Current pregnancy Post term, short interval between pregnancies GBS Positive HSV2-on valtrax, no lesions on exam   Past Medical History: Past Medical History:  Diagnosis Date  . Asthma    as a child  . Pregnancy induced hypertension    Labor induced @ 40 wks    Past Surgical History: Past Surgical History:  Procedure Laterality Date  . NO PAST SURGERIES      Obstetrical History: OB History    Gravida  2   Para  1   Term  1   Preterm  0   AB  0   Living  1     SAB  0   TAB  0   Ectopic  0   Multiple  0   Live Births  1           Social History: Social History   Socioeconomic History  . Marital status: Single    Spouse name: Not on file  . Number of children: Not on file  . Years of education: Not on file  . Highest education level: Not on file  Occupational History    Comment: unemployed  Social Needs  . Financial resource strain: Not on file  . Food insecurity    Worry: Sometimes true     Inability: Never true  . Transportation needs    Medical: No    Non-medical: No  Tobacco Use  . Smoking status: Current Every Day Smoker    Packs/day: 0.50    Types: Cigarettes  . Smokeless tobacco: Never Used  Substance and Sexual Activity  . Alcohol use: No  . Drug use: Yes    Types: Marijuana    Comment: current use - last 2 days ago  . Sexual activity: Yes    Birth control/protection: None  Lifestyle  . Physical activity    Days per week: Not on file    Minutes per session: Not on file  . Stress: Not on file  Relationships  . Social Herbalist on phone: Not on file    Gets together: Not on file    Attends religious service: Not on file    Active member of club or organization: Not on file    Attends meetings of clubs or organizations: Not on file    Relationship status: Not on file  Other Topics Concern  . Not on file  Social History Narrative  . Not on file    Family History: Family History  Problem Relation Age of Onset  . Hypertension Mother   .  Hypertension Father   . Cancer Maternal Grandmother   . Cancer Paternal Grandmother     Allergies: Allergies  Allergen Reactions  . Sulfa Antibiotics Hives  . Other Rash    Nickel     Medications Prior to Admission  Medication Sig Dispense Refill Last Dose  . Prenatal Vit-Fe Fumarate-FA (MULTIVITAMIN-PRENATAL) 27-0.8 MG TABS tablet Take 1 tablet by mouth daily at 12 noon.   08/24/2019 at Unknown time  . valACYclovir (VALTREX) 500 MG tablet Take 1 tablet (500 mg total) by mouth 2 (two) times daily. 60 tablet 1 08/24/2019 at Unknown time     Review of Systems  All systems reviewed and negative except as stated in HPI  Blood pressure 131/88, pulse 64, temperature 98.3 F (36.8 C), temperature source Oral, resp. rate 18, height 5\' 8"  (1.727 m), weight 91.6 kg, last menstrual period 11/14/2018, SpO2 100 %, currently breastfeeding. General appearance: alert, cooperative and appears stated age Lungs:  chest clear on auscultation, no crackles or wheeze, no respiratory distress Heart: RRR, no rubs or gallops  Abdomen: soft, non-tender; gravid abdomen, bowel sounds present  Pelvic: normal external genitalia, vagina is pink and rugated. No lesions. Cervix: 4.5, 50/60, -1,0 Extremities: Homans sign is negative, no sign of DVT, mild pedal edema.  Presentation: cephalic Fetal monitoring: 130bpm, moderate variability, accels present, no decels Uterine activity: 3-4 UCs noted, lasting 50-70 secs, mild, relaxed tone.  Dilation: 4.5 Effacement (%): 60 Station: -1, 0 Exam by:: Dr. Posey Pronto  Prenatal labs: ABO, Rh: --/--/B POS (09/04 1755) Antibody: NEG (09/04 1755) Rubella: 4.28 (03/20 1157) RPR: Non Reactive (06/15 0853)  HBsAg: Negative (03/20 1157)  HIV: Non Reactive (06/15 0853)  GBS: Positive (08/11 0000)  Glucola: 164, 86, 97 Genetic screening:  NIPS AFP: negative  Prenatal Transfer Tool  Maternal Diabetes: No Genetic Screening: Normal Maternal Ultrasounds/Referrals: Normal Fetal Ultrasounds or other Referrals:  None Maternal Substance Abuse:  No Significant Maternal Medications:  Meds include: Other:  Valacyclovir Significant Maternal Lab Results: None and Group B Strep positive  Results for orders placed or performed during the hospital encounter of 08/24/19 (from the past 24 hour(s))  SARS Coronavirus 2 [facility] order, Performed in [place] hospital lab) Nasopharyngeal Nasopharyngeal Swab   Collection Time: 08/24/19  5:21 PM   Specimen: Nasopharyngeal Swab  Result Value Ref Range   SARS Coronavirus 2 NEGATIVE NEGATIVE  POCT fern test   Collection Time: 08/24/19  5:22 PM  Result Value Ref Range   POCT Fern Test Positive = ruptured amniotic membanes   CBC   Collection Time: 08/24/19  5:55 PM  Result Value Ref Range   WBC 16.3 (H) 4.0 - 10.5 K/uL   RBC 3.97 3.87 - 5.11 MIL/uL   Hemoglobin 13.2 12.0 - 15.0 g/dL   HCT 38.4 36.0 - 46.0 %   MCV 96.7 80.0 - 100.0 fL   MCH  33.2 26.0 - 34.0 pg   MCHC 34.4 30.0 - 36.0 g/dL   RDW 12.7 11.5 - 15.5 %   Platelets 208 150 - 400 K/uL   nRBC 0.0 0.0 - 0.2 %  Type and screen   Collection Time: 08/24/19  5:55 PM  Result Value Ref Range   ABO/RH(D) B POS    Antibody Screen NEG    Sample Expiration      08/27/2019,2359 Performed at [facility], [place] [address], [place],  [id number]     Patient Active Problem List   Diagnosis Date Noted  . Labor and  delivery, indication for care 08/24/2019  . Supervision of high risk pregnancy, antepartum 03/09/2019  . Short interval between pregnancies affecting pregnancy, antepartum 03/09/2019  . Hx of fetal anomaly in prior pregnancy, currently pregnant 03/09/2019  . History of gestational hypertension 08/02/2018  . 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 Asthma 11/30/2017    Assessment/Plan:  MALANNA SHEELY is a 33 y.o. G2P1001 at [redacted]w[redacted]d here for SROM   Labor: Consider Pitocin --Pain control: would like epidural when in active  --Pencillin for GBS  --Monitor for HSV outbreakt, if outbreak will require c-section   Fetal Wellbeing:  --GBS (positive) on Pencillin  --Continuous fetal monitoring  --Circumcision desired but as OP  Postpartum Planning -- Feeding intention: breast milk  --Contraception: Depo --Tdap on 06/04/19   Lattie Haw, MD PGY-1, [place]  I saw and evaluated the patient. I agree with the findings and the plan of care as documented in the resident's note. Hx of gHTN. Patient has not been taking ASA as recommended. Will plan for light diet and then Pitocin ~ 1 hour thereafter. GBS pos on PCN. No vaginal lesions noted on exam. Cont Valtrex.   Barrington Ellison, MD [facility] Family Medicine Fellow, [facility] for [employer], [place]

## 2019-08-24 NOTE — MAU Provider Note (Signed)
First Provider Initiated Contact with Patient 08/24/19 1700       S: Ms. Christie Ali is a 33 y.o. G2P1001 at [redacted]w[redacted]d  who presents to MAU today complaining of leaking of fluid since 1430. She denies vaginal bleeding. She denies contractions. She reports normal fetal movement.    O: BP 124/72 (BP Location: Right Arm)   Pulse 87   Temp 98.5 F (36.9 C) (Oral)   Resp 18   Ht 5\' 8"  (1.727 m)   Wt 91.8 kg   LMP 11/14/2018 (Approximate)   SpO2 100%   BMI 30.77 kg/m  GENERAL: Well-developed, well-nourished female in no acute distress.  HEAD: Normocephalic, atraumatic.  CHEST: Normal effort of breathing, regular heart rate ABDOMEN: Soft, nontender, gravid PELVIC: Normal external female genitalia. Vagina is pink and rugated. Cervix with normal contour, no lesions. Normal discharge.  Positive pooling.   Cervical exam: deferred Presentation: Vertex(per U/S)   Pt informed that the ultrasound is considered a limited OB ultrasound and is not intended to be a complete ultrasound exam.  Patient also informed that the ultrasound is not being completed with the intent of assessing for fetal or placental anomalies or any pelvic abnormalities.  Explained that the purpose of today's ultrasound is to assess for  presentation.  Patient acknowledges the purpose of the exam and the limitations of the study.  Cephalic     Fetal Monitoring: Baseline: 135 Variability: moderate Accelerations: 15x15 Decelerations: none Contractions: irregular  Results for orders placed or performed during the hospital encounter of 08/24/19 (from the past 24 hour(s))  POCT fern test     Status: Abnormal   Collection Time: 08/24/19  5:22 PM  Result Value Ref Range   POCT Fern Test Positive = ruptured amniotic membanes      A: SIUP at [redacted]w[redacted]d  SROM  P: Admit to birthing suites GBS positive HSV +, no outbreak, on valtrex, no lesions on exam Vertex per u/s Care turned over to labor team  Jorje Guild,  NP 08/24/2019 5:22 PM

## 2019-08-25 ENCOUNTER — Encounter (HOSPITAL_COMMUNITY): Payer: Self-pay | Admitting: General Practice

## 2019-08-25 ENCOUNTER — Inpatient Hospital Stay (HOSPITAL_COMMUNITY): Payer: BC Managed Care – PPO | Admitting: Anesthesiology

## 2019-08-25 DIAGNOSIS — O99824 Streptococcus B carrier state complicating childbirth: Secondary | ICD-10-CM

## 2019-08-25 DIAGNOSIS — O480 Post-term pregnancy: Secondary | ICD-10-CM

## 2019-08-25 DIAGNOSIS — Z3A40 40 weeks gestation of pregnancy: Secondary | ICD-10-CM

## 2019-08-25 LAB — RPR: RPR Ser Ql: NONREACTIVE

## 2019-08-25 MED ORDER — ZOLPIDEM TARTRATE 5 MG PO TABS: 5.0000 mg | ORAL_TABLET | Freq: Every evening | ORAL | Status: DC | PRN

## 2019-08-25 MED ORDER — ONDANSETRON HCL 4 MG PO TABS: 4.0000 mg | ORAL_TABLET | ORAL | Status: DC | PRN

## 2019-08-25 MED ORDER — ONDANSETRON HCL 4 MG/2ML IJ SOLN: 4.0000 mg | INTRAMUSCULAR | Status: DC | PRN

## 2019-08-25 MED ORDER — SENNOSIDES-DOCUSATE SODIUM 8.6-50 MG PO TABS
2.0000 | ORAL_TABLET | ORAL | Status: DC
  Administered 2019-08-25: 2 via ORAL
  Filled 2019-08-25: qty 2

## 2019-08-25 MED ORDER — SODIUM CHLORIDE (PF) 0.9 % IJ SOLN
INTRAMUSCULAR | Status: DC | PRN
  Administered 2019-08-25: 12 mL/h via EPIDURAL

## 2019-08-25 MED ORDER — OXYCODONE HCL 5 MG PO TABS: 5.0000 mg | ORAL_TABLET | ORAL | Status: DC | PRN

## 2019-08-25 MED ORDER — DIPHENHYDRAMINE HCL 25 MG PO CAPS: 25.0000 mg | ORAL_CAPSULE | Freq: Four times a day (QID) | ORAL | Status: DC | PRN

## 2019-08-25 MED ORDER — SIMETHICONE 80 MG PO CHEW: 80.0000 mg | CHEWABLE_TABLET | ORAL | Status: DC | PRN

## 2019-08-25 MED ORDER — COCONUT OIL OIL: 1.0000 "application " | TOPICAL_OIL | Status: DC | PRN

## 2019-08-25 MED ORDER — PRENATAL MULTIVITAMIN CH
1.0000 | ORAL_TABLET | Freq: Every day | ORAL | Status: DC
  Administered 2019-08-25 – 2019-08-26 (×2): 1 via ORAL
  Filled 2019-08-25 (×2): qty 1

## 2019-08-25 MED ORDER — ACETAMINOPHEN 325 MG PO TABS
650.0000 mg | ORAL_TABLET | ORAL | Status: DC | PRN
  Administered 2019-08-26: 650 mg via ORAL

## 2019-08-25 MED ORDER — BENZOCAINE-MENTHOL 20-0.5 % EX AERO: 1.0000 "application " | INHALATION_SPRAY | CUTANEOUS | Status: DC | PRN

## 2019-08-25 MED ORDER — LIDOCAINE HCL (PF) 1 % IJ SOLN
INTRAMUSCULAR | Status: DC | PRN
  Administered 2019-08-25 (×2): 4 mL via EPIDURAL

## 2019-08-25 MED ORDER — IBUPROFEN 600 MG PO TABS
600.0000 mg | ORAL_TABLET | Freq: Four times a day (QID) | ORAL | Status: DC
  Administered 2019-08-25 – 2019-08-26 (×5): 600 mg via ORAL
  Filled 2019-08-25 (×5): qty 1

## 2019-08-25 MED ORDER — LACTATED RINGERS IV SOLN: 500.0000 mL | Freq: Once | INTRAVENOUS | Status: DC

## 2019-08-25 MED ORDER — WITCH HAZEL-GLYCERIN EX PADS: 1.0000 "application " | MEDICATED_PAD | CUTANEOUS | Status: DC | PRN

## 2019-08-25 MED ORDER — DIBUCAINE (PERIANAL) 1 % EX OINT: 1.0000 "application " | TOPICAL_OINTMENT | CUTANEOUS | Status: DC | PRN

## 2019-08-25 NOTE — Anesthesia Procedure Notes (Signed)
Epidural Patient location during procedure: OB Start time: 08/25/2019 12:32 AM End time: 08/25/2019 12:35 AM  Staffing Anesthesiologist: Brennan Bailey, MD Performed: anesthesiologist   Preanesthetic Checklist Completed: patient identified, pre-op evaluation, timeout performed, IV checked, risks and benefits discussed and monitors and equipment checked  Epidural Patient position: sitting Prep: site prepped and draped and DuraPrep Patient monitoring: continuous pulse ox, blood pressure and heart rate Approach: midline Location: L3-L4 Injection technique: LOR air  Needle:  Needle type: Tuohy  Needle gauge: 17 G Needle length: 9 cm Needle insertion depth: 6 cm Catheter type: closed end flexible Catheter size: 19 Gauge Catheter at skin depth: 11 cm Test dose: negative and Other (1% lidocaine)  Assessment Events: blood not aspirated, injection not painful, no injection resistance, negative IV test and no paresthesia  Additional Notes Patient identified. Risks, benefits, and alternatives discussed with patient including but not limited to bleeding, infection, nerve damage, paralysis, failed block, incomplete pain control, headache, blood pressure changes, nausea, vomiting, reactions to medication, itching, and postpartum back pain. Confirmed with bedside nurse the patient's most recent platelet count. Confirmed with patient that they are not currently taking any anticoagulation, have any bleeding history, or any family history of bleeding disorders. Patient expressed understanding and wished to proceed. All questions were answered. Sterile technique was used throughout the entire procedure. Please see nursing notes for vital signs.   Crisp LOR on first pass. Test dose was given through epidural catheter and negative prior to continuing to dose epidural or start infusion. Warning signs of high block given to the patient including shortness of breath, tingling/numbness in hands, complete  motor block, or any concerning symptoms with instructions to call for help. Patient was given instructions on fall risk and not to get out of bed. All questions and concerns addressed with instructions to call with any issues or inadequate analgesia.  Reason for block:procedure for pain

## 2019-08-25 NOTE — Lactation Note (Signed)
This note was copied from a baby's chart. Lactation Consultation Note  Patient Name: Christie Ali [id number] Date: 08/25/2019 Reason for consult: Initial assessment;Term  Baby is 4 hours old  As LC entered per mom baby last fed at 1230 for 15 mins.  LC offered to assist with hand expressing and latching. Baby awake and LC worked with mom to obtain depth.  Mom declined cross cradle and football positioning and  Desired to do the cradle position.  LC discussed the importance of depth at the breast and the  Baby learning it so it would prevent soreness , significant weight loss,  And enhance let down.  LC stressed the importance of STS feedings until the baby can stay awake For majority of feeding, back to birth weight, and gaining well.  Discussed nutritive vs non - nutritive feeding patterns and the importance  Of watching the baby for hanging out latched.  LC reviewed [place] resources after  D/C .     Maternal Data Has patient been taught Hand Expression?: Yes Does the patient have breastfeeding experience prior to this delivery?: Yes  Feeding Feeding Type: Breast Fed  LATCH Score Latch: Repeated attempts needed to sustain latch, nipple held in mouth throughout feeding, stimulation needed to elicit sucking reflex.  Audible Swallowing: Spontaneous and intermittent  Type of Nipple: Everted at rest and after stimulation  Comfort (Breast/Nipple): Soft / non-tender  Hold (Positioning): Assistance needed to correctly position infant at breast and maintain latch.  LATCH Score: 8  Interventions Interventions: Breast feeding basics reviewed;Assisted with latch;Skin to skin;Breast massage;Hand express;Breast compression;Adjust position;Support pillows;Position options;Expressed milk  Lactation Tools Discussed/Used     Consult Status Consult Status: Follow-up Date: 08/26/19 Follow-up type: In-patient    [place] 08/25/2019, 1:00 PM

## 2019-08-25 NOTE — Anesthesia Preprocedure Evaluation (Signed)
Anesthesia Evaluation  Patient identified by MRN, date of birth, ID band Patient awake    Reviewed: Allergy & Precautions, Patient's Chart, lab work & pertinent test results  History of Anesthesia Complications Negative for: history of anesthetic complications  Airway Mallampati: II  TM Distance: >3 FB Neck ROM: Full    Dental no notable dental hx.    Pulmonary asthma , Current Smoker,    Pulmonary exam normal        Cardiovascular hypertension (gestational), Normal cardiovascular exam     Neuro/Psych negative neurological ROS     GI/Hepatic negative GI ROS, Neg liver ROS,   Endo/Other  negative endocrine ROS  Renal/GU negative Renal ROS     Musculoskeletal negative musculoskeletal ROS (+)   Abdominal   Peds  Hematology negative hematology ROS (+)   Anesthesia Other Findings Day of surgery medications reviewed with the patient.  Reproductive/Obstetrics (+) Pregnancy                             Anesthesia Physical Anesthesia Plan  ASA: II  Anesthesia Plan: Epidural   Post-op Pain Management:    Induction:   PONV Risk Score and Plan: Treatment may vary due to age or medical condition  Airway Management Planned: Natural Airway  Additional Equipment:   Intra-op Plan:   Post-operative Plan:   Informed Consent: I have reviewed the patients History and Physical, chart, labs and discussed the procedure including the risks, benefits and alternatives for the proposed anesthesia with the patient or authorized representative who has indicated his/her understanding and acceptance.       Plan Discussed with:   Anesthesia Plan Comments:         Anesthesia Quick Evaluation

## 2019-08-25 NOTE — Discharge Summary (Addendum)
Postpartum Discharge Summary     Patient Name: Christie Ali DOB: [birth year] MRN: [id number]  Date of admission: 08/24/2019 Delivering Provider: Lattie Haw   Date of discharge: 08/26/2019  Admitting diagnosis: Leaking Fluid  mild CTX  Intrauterine pregnancy: [redacted]w[redacted]d    Secondary diagnosis:  Active Problems:   Labor and delivery, indication for care  Additional problems: n/a     Discharge diagnosis: Term Pregnancy Delivered                                                                                                Post partum procedures:n/a  Augmentation: Pitocin  Complications: None  Hospital course:  Onset of Labor With Vaginal Delivery     33y.o. yo [id number] admitted in Latent Labor on 08/24/2019. Patient had an uncomplicated labor course as follows:  Membrane Rupture Time/Date: 2:30 PM ,08/24/2019   Intrapartum Procedures: Episiotomy: None [1]                                         Lacerations:  Periurethral [8]  Patient had a delivery of a Viable infant. 08/25/2019  Information for the patient's newborn:  LiParneet, Glantz0[[id number]]Delivery Method: Vaginal, Spontaneous(Filed from Delivery Summary)     Pateint had an uncomplicated postpartum course.  She is ambulating, tolerating a regular diet, passing flatus, and urinating well. Patient is discharged home in stable condition on 08/26/19.  Delivery time: 8:29 AM    Magnesium Sulfate received: No BMZ received: No Rhophylac:No MMR:No Transfusion:No  Physical exam  Vitals:   08/25/19 1545 08/25/19 1945 08/25/19 2347 08/26/19 0540  BP: 110/68 119/67 121/65 122/76  Pulse: 62 (!) 59 (!) 52 (!) 56  Resp: '17 18 18 18  ' Temp: 98 F (36.7 C) 98.1 F (36.7 C) 98.2 F (36.8 C) 97.8 F (36.6 C)  TempSrc: Oral Oral Oral Oral  SpO2: 100% 99% 99%   Weight:      Height:       General: alert, cooperative and no distress Lochia: appropriate Uterine Fundus: firm Incision: N/A DVT Evaluation: No  cords or calf tenderness. No significant calf/ankle edema. Labs: Lab Results  Component Value Date   WBC 16.3 (H) 08/24/2019   HGB 13.2 08/24/2019   HCT 38.4 08/24/2019   MCV 96.7 08/24/2019   PLT 208 08/24/2019   CMP Latest Ref Rng & Units 04/15/2019  Glucose 70 - 99 mg/dL 96  BUN 6 - 20 mg/dL 8  Creatinine 0.44 - 1.00 mg/dL 0.85  Sodium 135 - 145 mmol/L 135  Potassium 3.5 - 5.1 mmol/L 3.7  Chloride 98 - 111 mmol/L 106  CO2 22 - 32 mmol/L 22  Calcium 8.9 - 10.3 mg/dL 8.4(L)  Total Protein 6.5 - 8.1 g/dL 6.5  Total Bilirubin 0.3 - 1.2 mg/dL 0.8  Alkaline Phos 38 - 126 U/L 55  AST 15 - 41 U/L 14(L)  ALT 0 - 44 U/L 8    Discharge instruction: per After Visit  Summary and "Baby and Me Booklet".  After visit meds:  Allergies as of 08/26/2019      Reactions   Sulfa Antibiotics Hives   Other Rash   Nickel       Medication List    TAKE these medications   acetaminophen 325 MG tablet Commonly known as: Tylenol Take 2 tablets (650 mg total) by mouth every 4 (four) hours as needed (for pain scale < 4).   ibuprofen 600 MG tablet Commonly known as: ADVIL Take 1 tablet (600 mg total) by mouth every 6 (six) hours.   multivitamin-prenatal 27-0.8 MG Tabs tablet Take 1 tablet by mouth daily at 12 noon.   senna-docusate 8.6-50 MG tablet Commonly known as: Senokot-S Take 2 tablets by mouth daily. Start taking on: August 27, 2019   valACYclovir 500 MG tablet Commonly known as: Valtrex Take 1 tablet (500 mg total) by mouth 2 (two) times daily.       Diet: routine diet  Activity: Advance as tolerated. Pelvic rest for 6 weeks.   Outpatient follow up: 4 weeks Follow up Appt: No future appointments. Follow up Visit:  Please schedule this patient for PP visit in: 4 weeks Low risk pregnancy complicated by: n/a Delivery mode:  SVD Anticipated Birth Control:  Depo PP Procedures needed: n/a  Schedule Integrated [place] visit: no Provider: Any provider    Newborn  Data: Live born female  Birth Weight: 7 lb 12.3 oz (3524 g) APGAR: 8, 9  Newborn Delivery   Time head delivered: 08/25/2019 08:28:00 Birth date/time: 08/25/2019 08:29:00 Delivery type: Vaginal, Spontaneous      Baby Feeding: Breast Disposition:home with mother  Merilyn Baba, DO 08/26/2019 5:09 PM

## 2019-08-26 ENCOUNTER — Other Ambulatory Visit (HOSPITAL_COMMUNITY)
Admission: RE | Admit: 2019-08-26 | Discharge: 2019-08-26 | Disposition: A | Discharge status: Home / Self Care | Payer: BC Managed Care – PPO | Source: Ambulatory Visit | Attending: Obstetrics and Gynecology | Admitting: Obstetrics and Gynecology

## 2019-08-26 MED ORDER — ACETAMINOPHEN 325 MG PO TABS: 650.0000 mg | ORAL_TABLET | ORAL | Status: DC | PRN

## 2019-08-26 MED ORDER — SENNOSIDES-DOCUSATE SODIUM 8.6-50 MG PO TABS: 2.0000 | ORAL_TABLET | ORAL | Status: DC

## 2019-08-26 MED ORDER — MEDROXYPROGESTERONE ACETATE 150 MG/ML IM SUSP
150.0000 mg | Freq: Once | INTRAMUSCULAR | Status: AC
  Administered 2019-08-26: 150 mg via INTRAMUSCULAR
  Filled 2019-08-26: qty 1

## 2019-08-26 MED ORDER — IBUPROFEN 600 MG PO TABS: 600.0000 mg | ORAL_TABLET | Freq: Four times a day (QID) | ORAL | 0 refills | Status: DC

## 2019-08-26 NOTE — Progress Notes (Signed)
Patient desiring to go home and pediatrician has discharged baby.  Still waiting on discharge order for mother.  Mother will not wait for discharge order, she requested to leave AMA.  Patient provided with AMA form and signed.  Patient declined postpartum and newborn discharge education.    Christie Ali, [place]

## 2019-08-26 NOTE — Lactation Note (Signed)
This note was copied from a baby's chart. Lactation Consultation Note  Patient Name: Christie Ali [id number] Date: 08/26/2019 Reason for consult: Term;Infant weight loss;Nipple pain/trauma(LC updated the doc flow sheets per mom)  Baby is 7 hours old  Per mom the baby recently fed at 1105 for 15 mins and asleep.  LC unable to check a latch today due to baby recently feeding.  Per mom breast are fuller and warmer. LC reassured mom that is a good  Sign her milk is coming in. Sore nipples and engorgement prevention and tx reviewed. Mom already has a hand pump. LC provided the increase flange #27 and shells with instructions.  Mom mentioned she may decide to pump and bottle feed like she did with her 1st baby so she knows exactly how mom her baby is taking each feeding.  LC reviewed supply and demand and the importance of breast stimulation at least 8 times a day to establish and protect milk supply, storage of milk supply.  Per mom has a DEBP at home.  Mom aware of the [facility] resources at [facility] health after D/C.     Maternal Data Has patient been taught Hand Expression?: Yes  Feeding Feeding Type: Breast Fed  LATCH Score                   Interventions Interventions: Breast feeding basics reviewed  Lactation Tools Discussed/Used Tools: Shells;Pump Shell Type: Inverted Breast pump type: Manual Pump Review: Milk Storage   Consult Status Consult Status: Complete Date: 08/26/19    Myer Haff 08/26/2019, 12:02 PM

## 2019-08-26 NOTE — Anesthesia Postprocedure Evaluation (Signed)
Anesthesia Post Note  Patient: Christie Ali  Procedure(s) Performed: AN AD [place]     Patient location during evaluation: Mother Baby Anesthesia Type: Epidural Level of consciousness: awake and alert and oriented Pain management: satisfactory to patient Vital Signs Assessment: post-procedure vital signs reviewed and stable Respiratory status: respiratory function stable Cardiovascular status: stable Postop Assessment: no headache, no backache, epidural receding, patient able to bend at knees, no signs of nausea or vomiting and adequate PO intake Anesthetic complications: no    Last Vitals:  Vitals:   08/25/19 2347 08/26/19 0540  BP: 121/65 122/76  Pulse: (!) 52 (!) 56  Resp: 18 18  Temp: 36.8 C 36.6 C  SpO2: 99%     Last Pain:  Vitals:   08/26/19 0540  TempSrc: Oral  PainSc: 2    Pain Goal: Patients Stated Pain Goal: 2 (08/25/19 0444)                 Katherina Mires

## 2019-08-26 NOTE — Clinical Social Work Maternal (Signed)
CLINICAL SOCIAL WORK MATERNAL/CHILD NOTE  Patient Details  Name: Christie Ali MRN: [id number] Date of Birth: October 05, 1986  Date:  08/26/2019  Clinical Social Worker Initiating Note:  Madilyn Fireman, MSW, LCSW-A Date/Time: Initiated:  08/26/19/1100     Child's Name:  Christie Ali   Biological Parents:  Mother, Father   Need for Interpreter:  None   Reason for Referral:  Current Substance Use/Substance Use During Pregnancy    Address:  [address] [place] [address]    Phone number:  [phone] (home)     Additional phone number: None  Household Members/Support Persons (HM/SP):   Household Member/Support Person 1   HM/SP Name Relationship DOB or Age  HM/SP -1  Malachy Moan Mother    HM/SP -2  Ka'Dyn Kenton Kingfisher Child 06/22/2018  HM/SP -3        HM/SP -4        HM/SP -5        HM/SP -6        HM/SP -7        HM/SP -8          Natural Supports (not living in the home):  Children, Parent   Professional Supports: None   Employment: Unemployed   Type of Work:     Education:  Programmer, systems   Homebound arranged:    Museum/gallery curator Resources:  Multimedia programmer   Other Resources:  [employer], Physicist, medical    Cultural/Religious Considerations Which May Impact Care:  None  Strengths:  Engineer, materials, Ability to meet basic needs , Home prepared for child    Psychotropic Medications:         Pediatrician:    [place]olic[place] area  Pediatrician List:   [place])  [place]      Pediatrician Fax Number:    Risk Factors/Current Problems:  None   Cognitive State:  Alert    Mood/Affect:  Calm , Comfortable , Happy    CSW Assessment: CSW spoke with MOB to complete assessment. CSW explained reason for consult and MOB stating understanding and that she was waiting for the call. MOB states familiarity with hospital drug screening policies due to her recent birth  in July 2019. MOB states this is her second child, her oldest being one named Ka'Dyn. MOB reports that her last marijuana use was approximately two weeks ago. CSW informed MOB that the infant's UDS was negative, but that the cord would be monitored for results and a report would be made. MOB stated understanding and did not have questions or concerns regarding results. MOB reports that the FOB is involved but that she resides with her parents and her daughter. MOB reports she has a car seat for safe transportation of the infant with knowledge of installation and use. MOB reports having a crib and bassinet at home for safe sleeping. SIDS precautions were reviewed. MOB reports she has chosen [facility] for healthcare. MOB reports having a personal vehicle to get to medical appointments. MOB denies any history of mental health diagnoses. MOB denies any further questions at this time. CSW encouraged MOB to reach out for assistance if any needs arise, she stated agreement.  CSW will continue to monitor chart for cord results and will make report if warranted.  CSW Plan/Description:  CSW Will Continue to Monitor Umbilical Cord Tissue Drug Screen Results and Make Report  if Earlie Counts, LCSW 08/26/2019, 11:10 AM

## 2019-08-28 ENCOUNTER — Inpatient Hospital Stay (HOSPITAL_COMMUNITY): Payer: BC Managed Care – PPO

## 2019-08-28 ENCOUNTER — Inpatient Hospital Stay (HOSPITAL_COMMUNITY): Admission: AD | Admit: 2019-08-28 | Payer: BC Managed Care – PPO | Source: Home / Self Care

## 2019-09-24 ENCOUNTER — Telehealth: Payer: Self-pay | Admitting: General Practice

## 2019-09-24 NOTE — Telephone Encounter (Signed)
Called patient regarding mychart message and offered work in appt for tomorrow with a provider at 8:35am. Told patient we can also complete her postpartum visit at that time. Patient verbalized understanding & had no questions.

## 2019-09-25 ENCOUNTER — Encounter: Payer: Self-pay | Admitting: Family Medicine

## 2019-09-25 ENCOUNTER — Ambulatory Visit: Payer: BC Managed Care – PPO | Admitting: Student

## 2019-10-03 ENCOUNTER — Other Ambulatory Visit (HOSPITAL_COMMUNITY)
Admission: RE | Admit: 2019-10-03 | Discharge: 2019-10-03 | Disposition: A | Discharge status: Home / Self Care | Payer: BC Managed Care – PPO | Source: Ambulatory Visit | Attending: Medical | Admitting: Medical

## 2019-10-03 ENCOUNTER — Ambulatory Visit (INDEPENDENT_AMBULATORY_CARE_PROVIDER_SITE_OTHER): Payer: BC Managed Care – PPO | Admitting: Medical

## 2019-10-03 ENCOUNTER — Encounter: Payer: Self-pay | Admitting: Medical

## 2019-10-03 ENCOUNTER — Other Ambulatory Visit: Payer: Self-pay

## 2019-10-03 VITALS — BP 123/86 | HR 76 | Ht 67.0 in | Wt 183.0 lb

## 2019-10-03 DIAGNOSIS — N898 Other specified noninflammatory disorders of vagina: Secondary | ICD-10-CM | POA: Diagnosis not present

## 2019-10-03 DIAGNOSIS — B9689 Other specified bacterial agents as the cause of diseases classified elsewhere: Secondary | ICD-10-CM | POA: Diagnosis not present

## 2019-10-03 DIAGNOSIS — N760 Acute vaginitis: Secondary | ICD-10-CM | POA: Insufficient documentation

## 2019-10-03 NOTE — Progress Notes (Signed)
Subjective:     Christie Ali is a 33 y.o. female who presents for a postpartum visit. She is 5 weeks postpartum following a spontaneous vaginal delivery. I have fully reviewed the prenatal and intrapartum course. The delivery was at 40/4 gestational weeks. Outcome: spontaneous vaginal delivery. Anesthesia: epidural. Postpartum course has been normal. Baby's course has been normal. Baby is feeding by both breast and bottle - [employer] Good Start. Bleeding staining only. Bowel function is normal. Bladder function is normal. Patient is not sexually active. Contraception method is Depo-Provera injections. Postpartum depression screening: negative.  The following portions of the patient's history were reviewed and updated as appropriate: allergies, current medications, past family history, past medical history, past social history, past surgical history and problem list.  Review of Systems Pertinent items are noted in HPI.   Objective:    BP 123/86   Pulse 76   Ht 5\' 7"  (1.702 m)   Wt 183 lb (83 kg)   LMP 11/14/2018 (Approximate)   BMI 28.66 kg/m   General:  alert and cooperative   Breasts:  not performed  Lungs: clear to auscultation bilaterally  Heart:  regular rate and rhythm, S1, S2 normal, no murmur, click, rub or gallop  Abdomen: normal findings: soft, non-tender   Vulva:  not evaluated  Vagina: not evaluated  Cervix:  not evaluated  Corpus: not examined  Adnexa:  not evaluated  Rectal Exam: Not performed.        Assessment:     Normal postpartum exam. Pap smear not done at today's visit.  Last pap smear 03/2019 normal  Vaginal odor Depo provera   Plan:    1. Contraception: Depo-Provera injections 2. Wet prep today - self swab  3.  Follow up in: 2 months for Depo Provera or sooner as needed.    Luvenia Redden, PA-C 10/03/2019 10:20 AM

## 2019-10-03 NOTE — Patient Instructions (Signed)
Medroxyprogesterone injection [Contraceptive] What is this medicine? MEDROXYPROGESTERONE (me DROX ee proe JES te rone) contraceptive injections prevent pregnancy. They provide effective birth control for 3 months. Depo-subQ Provera 104 is also used for treating pain related to endometriosis. This medicine may be used for other purposes; ask your health care provider or pharmacist if you have questions. COMMON BRAND NAME(S): Depo-Provera, Depo-subQ Provera 104 What should I tell my health care provider before I take this medicine? They need to know if you have any of these conditions:  frequently drink alcohol  asthma  blood vessel disease or a history of a blood clot in the lungs or legs  bone disease such as osteoporosis  breast cancer  diabetes  eating disorder (anorexia nervosa or bulimia)  high blood pressure  HIV infection or AIDS  kidney disease  liver disease  mental depression  migraine  seizures (convulsions)  stroke  tobacco smoker  vaginal bleeding  an unusual or allergic reaction to medroxyprogesterone, other hormones, medicines, foods, dyes, or preservatives  pregnant or trying to get pregnant  breast-feeding How should I use this medicine? Depo-Provera Contraceptive injection is given into a muscle. Depo-subQ Provera 104 injection is given under the skin. These injections are given by a health care professional. You must not be pregnant before getting an injection. The injection is usually given during the first 5 days after the start of a menstrual period or 6 weeks after delivery of a baby. Talk to your pediatrician regarding the use of this medicine in children. Special care may be needed. These injections have been used in female children who have started having menstrual periods. Overdosage: If you think you have taken too much of this medicine contact a poison control center or emergency room at once. NOTE: This medicine is only for you. Do not  share this medicine with others. What if I miss a dose? Try not to miss a dose. You must get an injection once every 3 months to maintain birth control. If you cannot keep an appointment, call and reschedule it. If you wait longer than 13 weeks between Depo-Provera contraceptive injections or longer than 14 weeks between Depo-subQ Provera 104 injections, you could get pregnant. Use another method for birth control if you miss your appointment. You may also need a pregnancy test before receiving another injection. What may interact with this medicine? Do not take this medicine with any of the following medications:  bosentan This medicine may also interact with the following medications:  aminoglutethimide  antibiotics or medicines for infections, especially rifampin, rifabutin, rifapentine, and griseofulvin  aprepitant  barbiturate medicines such as phenobarbital or primidone  bexarotene  carbamazepine  medicines for seizures like ethotoin, felbamate, oxcarbazepine, phenytoin, topiramate  modafinil  St. John's wort This list may not describe all possible interactions. Give your health care provider a list of all the medicines, herbs, non-prescription drugs, or dietary supplements you use. Also tell them if you smoke, drink alcohol, or use illegal drugs. Some items may interact with your medicine. What should I watch for while using this medicine? This drug does not protect you against HIV infection (AIDS) or other sexually transmitted diseases. Use of this product may cause you to lose calcium from your bones. Loss of calcium may cause weak bones (osteoporosis). Only use this product for more than 2 years if other forms of birth control are not right for you. The longer you use this product for birth control the more likely you will be at risk   for weak bones. Ask your health care professional how you can keep strong bones. You may have a change in bleeding pattern or irregular periods.  Many females stop having periods while taking this drug. If you have received your injections on time, your chance of being pregnant is very low. If you think you may be pregnant, see your health care professional as soon as possible. Tell your health care professional if you want to get pregnant within the next year. The effect of this medicine may last a long time after you get your last injection. What side effects may I notice from receiving this medicine? Side effects that you should report to your doctor or health care professional as soon as possible:  allergic reactions like skin rash, itching or hives, swelling of the face, lips, or tongue  breast tenderness or discharge  breathing problems  changes in vision  depression  feeling faint or lightheaded, falls  fever  pain in the abdomen, chest, groin, or leg  problems with balance, talking, walking  unusually weak or tired  yellowing of the eyes or skin Side effects that usually do not require medical attention (report to your doctor or health care professional if they continue or are bothersome):  acne  fluid retention and swelling  headache  irregular periods, spotting, or absent periods  temporary pain, itching, or skin reaction at site where injected  weight gain This list may not describe all possible side effects. Call your doctor for medical advice about side effects. You may report side effects to FDA at 1-800-FDA-1088. Where should I keep my medicine? This does not apply. The injection will be given to you by a health care professional. NOTE: This sheet is a summary. It may not cover all possible information. If you have questions about this medicine, talk to your doctor, pharmacist, or health care provider.  2020 Elsevier/Gold Standard (2008-12-27 18:37:56)  

## 2019-10-08 LAB — CERVICOVAGINAL ANCILLARY ONLY
Bacterial Vaginitis (gardnerella): POSITIVE — AB
Candida Glabrata: NEGATIVE
Candida Vaginitis: NEGATIVE
Chlamydia: NEGATIVE
Comment: NEGATIVE
Comment: NEGATIVE
Comment: NEGATIVE
Comment: NEGATIVE
Comment: NEGATIVE
Comment: NORMAL
Neisseria Gonorrhea: NEGATIVE
Trichomonas: NEGATIVE

## 2019-10-09 MED ORDER — METRONIDAZOLE 500 MG PO TABS: 500.0000 mg | ORAL_TABLET | Freq: Two times a day (BID) | ORAL | 0 refills | Status: DC

## 2019-10-09 NOTE — Addendum Note (Signed)
Addended by: Luvenia Redden on: 10/09/2019 10:33 AM   Modules accepted: Orders

## 2019-12-03 ENCOUNTER — Ambulatory Visit: Payer: BC Managed Care – PPO

## 2019-12-05 ENCOUNTER — Other Ambulatory Visit: Payer: Self-pay

## 2019-12-05 ENCOUNTER — Ambulatory Visit (INDEPENDENT_AMBULATORY_CARE_PROVIDER_SITE_OTHER): Payer: BC Managed Care – PPO | Admitting: General Practice

## 2019-12-05 VITALS — BP 111/74 | HR 80 | Ht 69.0 in | Wt 199.0 lb

## 2019-12-05 DIAGNOSIS — Z3042 Encounter for surveillance of injectable contraceptive: Secondary | ICD-10-CM

## 2019-12-05 MED ORDER — MEDROXYPROGESTERONE ACETATE 150 MG/ML IM SUSP
150.0000 mg | Freq: Once | INTRAMUSCULAR | Status: AC
  Administered 2019-12-05: 14:00:00 150 mg via INTRAMUSCULAR

## 2019-12-05 NOTE — Progress Notes (Signed)
Gwenlyn Saran here for Depo-Provera  Injection.  Injection administered without complication. Patient will return in 3 months for next injection.  Derinda Late, RN 12/05/2019  1:48 PM

## 2019-12-28 NOTE — Progress Notes (Signed)
I have reviewed this chart and agree with the RN/CMA assessment and management.    Chancelor Hardrick C Tiny Chaudhary, MD, FACOG Attending Physician, [facility]  

## 2020-02-20 ENCOUNTER — Ambulatory Visit (INDEPENDENT_AMBULATORY_CARE_PROVIDER_SITE_OTHER): Payer: Medicaid Other | Admitting: *Deleted

## 2020-02-20 ENCOUNTER — Other Ambulatory Visit: Payer: Self-pay

## 2020-02-20 VITALS — BP 109/63 | HR 68 | Wt 205.3 lb

## 2020-02-20 DIAGNOSIS — Z3042 Encounter for surveillance of injectable contraceptive: Secondary | ICD-10-CM

## 2020-02-20 MED ORDER — MEDROXYPROGESTERONE ACETATE 150 MG/ML IM SUSP
150.0000 mg | Freq: Once | INTRAMUSCULAR | Status: AC
  Administered 2020-02-20: 10:00:00 150 mg via INTRAMUSCULAR

## 2020-02-20 NOTE — Progress Notes (Signed)
Gwenlyn Saran here for Depo-provera   Injection.  Injection administered without complication. Patient will return in 3 months for next injection.  Tiffiny Worthy,RN 02/20/2020  10:02 AM

## 2020-02-20 NOTE — Progress Notes (Signed)
Agree with A & P. 

## 2020-05-07 ENCOUNTER — Ambulatory Visit: Payer: Medicaid Other

## 2020-10-21 ENCOUNTER — Ambulatory Visit (INDEPENDENT_AMBULATORY_CARE_PROVIDER_SITE_OTHER): Payer: BC Managed Care – PPO | Admitting: Lactation Services

## 2020-10-21 ENCOUNTER — Other Ambulatory Visit: Payer: Self-pay

## 2020-10-21 DIAGNOSIS — Z3201 Encounter for pregnancy test, result positive: Secondary | ICD-10-CM

## 2020-10-21 DIAGNOSIS — Z3401 Encounter for supervision of normal first pregnancy, first trimester: Secondary | ICD-10-CM

## 2020-10-21 LAB — POCT PREGNANCY, URINE: Preg Test, Ur: POSITIVE — AB

## 2020-10-21 MED ORDER — PRENATAL 27-0.8 MG PO TABS: 1.0000 | ORAL_TABLET | Freq: Every day | ORAL | 11 refills | Status: DC

## 2020-10-21 NOTE — Progress Notes (Signed)
Patient was assessed and managed by nursing staff during this encounter. I have reviewed the chart and agree with the documentation and plan. I have also made any necessary editorial changes.  Clarisa Fling, NP 10/21/2020 1:13 PM

## 2020-10-21 NOTE — Progress Notes (Signed)
Patient with drop off UPT. UPT positive.   Called patient with results. Patient reports her LMP was 09/14. EDD 07/09/2021  Patient is not taking PNV, prescription sent to pharmacy.   Message sent to front desk to schedule to start [facility].   Miscarriage/Ectopic precautions given. Informed patient to seek emergency care at [facility] at [facility] for extreme abdominal/pelvic pain or heavy bleeding.

## 2020-10-28 ENCOUNTER — Encounter: Payer: Self-pay | Admitting: Family Medicine

## 2020-11-11 ENCOUNTER — Encounter: Payer: Self-pay | Admitting: Emergency Medicine

## 2020-11-11 ENCOUNTER — Emergency Department
Admission: EM | Admit: 2020-11-11 | Discharge: 2020-11-11 | Disposition: A | Discharge status: Home / Self Care | Payer: BC Managed Care – PPO | Attending: Emergency Medicine | Admitting: Emergency Medicine

## 2020-11-11 ENCOUNTER — Emergency Department: Payer: BC Managed Care – PPO

## 2020-11-11 ENCOUNTER — Other Ambulatory Visit: Payer: Self-pay

## 2020-11-11 DIAGNOSIS — J45909 Unspecified asthma, uncomplicated: Secondary | ICD-10-CM | POA: Diagnosis not present

## 2020-11-11 DIAGNOSIS — Z3A01 Less than 8 weeks gestation of pregnancy: Secondary | ICD-10-CM | POA: Diagnosis not present

## 2020-11-11 DIAGNOSIS — O039 Complete or unspecified spontaneous abortion without complication: Secondary | ICD-10-CM

## 2020-11-11 DIAGNOSIS — N939 Abnormal uterine and vaginal bleeding, unspecified: Secondary | ICD-10-CM

## 2020-11-11 DIAGNOSIS — F17210 Nicotine dependence, cigarettes, uncomplicated: Secondary | ICD-10-CM | POA: Insufficient documentation

## 2020-11-11 DIAGNOSIS — O4691 Antepartum hemorrhage, unspecified, first trimester: Secondary | ICD-10-CM | POA: Diagnosis not present

## 2020-11-11 DIAGNOSIS — O209 Hemorrhage in early pregnancy, unspecified: Secondary | ICD-10-CM | POA: Diagnosis not present

## 2020-11-11 LAB — URINALYSIS, COMPLETE (UACMP) WITH MICROSCOPIC
Bacteria, UA: NONE SEEN
Bilirubin Urine: NEGATIVE
Glucose, UA: NEGATIVE mg/dL
Ketones, ur: NEGATIVE mg/dL
Nitrite: NEGATIVE
Protein, ur: 30 mg/dL — AB
RBC / HPF: >50 RBC/hpf — ABNORMAL HIGH (ref 0–5)
Specific Gravity, Urine: 1.027 (ref 1.005–1.030)
WBC, UA: NONE SEEN WBC/hpf (ref 0–5)
pH: 6 (ref 5.0–8.0)

## 2020-11-11 LAB — CBC
HCT: 43.2 % (ref 36.0–46.0)
Hemoglobin: 14.5 g/dL (ref 12.0–15.0)
MCH: 31.9 pg (ref 26.0–34.0)
MCHC: 33.6 g/dL (ref 30.0–36.0)
MCV: 94.9 fL (ref 80.0–100.0)
Platelets: 244 10*3/uL (ref 150–400)
RBC: 4.55 MIL/uL (ref 3.87–5.11)
RDW: 11.3 % — ABNORMAL LOW (ref 11.5–15.5)
WBC: 9.3 10*3/uL (ref 4.0–10.5)
nRBC: 0 % (ref 0.0–0.2)

## 2020-11-11 LAB — COMPREHENSIVE METABOLIC PANEL
ALT: 10 U/L (ref 0–44)
AST: 17 U/L (ref 15–41)
Albumin: 4.3 g/dL (ref 3.5–5.0)
Alkaline Phosphatase: 49 U/L (ref 38–126)
Anion gap: 8 (ref 5–15)
BUN: 10 mg/dL (ref 6–20)
CO2: 26 mmol/L (ref 22–32)
Calcium: 8.9 mg/dL (ref 8.9–10.3)
Chloride: 104 mmol/L (ref 98–111)
Creatinine, Ser: 1.07 mg/dL — ABNORMAL HIGH (ref 0.44–1.00)
GFR, Estimated: >60 mL/min (ref >60)
Glucose, Bld: 73 mg/dL (ref 70–99)
Potassium: 3.6 mmol/L (ref 3.5–5.1)
Sodium: 138 mmol/L (ref 135–145)
Total Bilirubin: 1 mg/dL (ref 0.3–1.2)
Total Protein: 7.2 g/dL (ref 6.5–8.1)

## 2020-11-11 LAB — HCG, QUANTITATIVE, PREGNANCY: hCG, Beta Chain, Quant, S: 4106 m[IU]/mL — ABNORMAL HIGH (ref <5)

## 2020-11-11 LAB — POC URINE PREG, ED: Preg Test, Ur: POSITIVE — AB

## 2020-11-11 MED ORDER — IBUPROFEN 800 MG PO TABS: 800.0000 mg | ORAL_TABLET | Freq: Three times a day (TID) | ORAL | 0 refills | Status: DC | PRN

## 2020-11-11 MED ORDER — ONDANSETRON 4 MG PO TBDP: 4.0000 mg | ORAL_TABLET | Freq: Three times a day (TID) | ORAL | 0 refills | Status: DC | PRN

## 2020-11-11 NOTE — ED Provider Notes (Signed)
[facility] Emergency Department Provider Note  ____________________________________________   First MD Initiated Contact with Patient 11/11/20 [phone]     (approximate)  I have reviewed the triage vital signs and the nursing notes.   HISTORY  Chief Complaint Vaginal Bleeding    HPI Christie Ali is a 34 y.o. female G3P2002 at estimated 6-[redacted] wk GA here with vaginal bleeding.  The patient states that her symptoms started 2 to 3 days ago as initially light vaginal bleeding and spotting.  She states that she is an estimated 6 to [redacted] weeks pregnant.  She is a G3, P2 with a 29 and 63-year-old.  She denies any significant issues with bleeding during her previous pregnancies.  No history of miscarriage.  She has had some mild lower back pain but states she believes this is due to working, and she recently began a new job at [employer].  Denies any significant increased abdominal cramping.  Denies any nausea or vomiting.  No fevers or chills.  No vaginal bleeding or discharge.  No urinary symptoms.       Past Medical History:  Diagnosis Date  . Asthma    as a child  . Pregnancy induced hypertension    Labor induced @ 40 wks    Patient Active Problem List   Diagnosis Date Noted  . History of gestational hypertension 08/02/2018  . 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 Asthma 11/30/2017    Past Surgical History:  Procedure Laterality Date  . NO PAST SURGERIES      Prior to Admission medications   Medication Sig Start Date End Date Taking? Authorizing Provider  acetaminophen (TYLENOL) 325 MG tablet Take 2 tablets (650 mg total) by mouth every 4 (four) hours as needed (for pain scale < 4). 08/26/19   Lattie Haw, MD  ibuprofen (ADVIL) 800 MG tablet Take 1 tablet (800 mg total) by mouth every 8 (eight) hours as needed for moderate pain or cramping. 11/11/20   Duffy Bruce, MD  metroNIDAZOLE (FLAGYL) 500 MG tablet Take 1 tablet (500 mg total) by mouth 2 (two) times daily.  10/09/19   Luvenia Redden, PA-C  ondansetron (ZOFRAN ODT) 4 MG disintegrating tablet Take 1 tablet (4 mg total) by mouth every 8 (eight) hours as needed for nausea or vomiting. 11/11/20   Duffy Bruce, MD  Prenatal Vit-Fe Fumarate-FA (MULTIVITAMIN-PRENATAL) 27-0.8 MG TABS tablet Take 1 tablet by mouth daily at 12 noon. 10/21/20   Nugent, Gerrie Nordmann, NP  senna-docusate (SENOKOT-S) 8.6-50 MG tablet Take 2 tablets by mouth daily. Patient not taking: Reported on 10/03/2019 08/27/19   Lattie Haw, MD  valACYclovir (VALTREX) 500 MG tablet Take 1 tablet (500 mg total) by mouth 2 (two) times daily. 07/17/19   Glenice Bow, DO    Allergies Sulfa antibiotics and Other  Family History  Problem Relation Age of Onset  . Hypertension Mother   . Hypertension Father   . Cancer Maternal Grandmother   . Cancer Paternal Grandmother     Social History Social History   Tobacco Use  . Smoking status: Current Every Day Smoker    Packs/day: 0.50    Types: Cigarettes  . Smokeless tobacco: Never Used  Vaping Use  . Vaping Use: Never used  Substance Use Topics  . Alcohol use: No  . Drug use: Yes    Types: Marijuana    Comment: current use - last 2 days ago    Review of Systems  Review of Systems  Constitutional:  Negative for fatigue and fever.  HENT: Negative for congestion and sore throat.   Eyes: Negative for visual disturbance.  Respiratory: Negative for cough and shortness of breath.   Cardiovascular: Negative for chest pain.  Gastrointestinal: Negative for abdominal pain, diarrhea, nausea and vomiting.  Genitourinary: Positive for vaginal bleeding. Negative for flank pain.  Musculoskeletal: Negative for back pain and neck pain.  Skin: Negative for rash and wound.  Neurological: Negative for weakness.  All other systems reviewed and are negative.    ____________________________________________  PHYSICAL EXAM:      VITAL SIGNS: ED Triage Vitals [11/11/20 0615]  Enc Vitals Group      BP 116/74     Pulse Rate 71     Resp 20     Temp 97.7 F (36.5 C)     Temp Source Oral     SpO2 100 %     Weight 185 lb (83.9 kg)     Height 5\' 9"  (1.753 m)     Head Circumference      Peak Flow      Pain Score 0     Pain Loc      Pain Edu?      Excl. in [place]?      Physical Exam Vitals and nursing note reviewed.  Constitutional:      General: She is not in acute distress.    Appearance: She is well-developed.  HENT:     Head: Normocephalic and atraumatic.  Eyes:     Conjunctiva/sclera: Conjunctivae normal.  Cardiovascular:     Rate and Rhythm: Normal rate and regular rhythm.     Heart sounds: Normal heart sounds.  Pulmonary:     Effort: Pulmonary effort is normal. No respiratory distress.     Breath sounds: No wheezing.  Abdominal:     General: There is no distension.  Musculoskeletal:     Cervical back: Neck supple.  Skin:    General: Skin is warm.     Capillary Refill: Capillary refill takes less than 2 seconds.     Findings: No rash.  Neurological:     Mental Status: She is alert and oriented to person, place, and time.     Motor: No abnormal muscle tone.       ____________________________________________   LABS (all labs ordered are listed, but only abnormal results are displayed)  Labs Reviewed  CBC - Abnormal; Notable for the following components:      Result Value   RDW 11.3 (*)    All other components within normal limits  COMPREHENSIVE METABOLIC PANEL - Abnormal; Notable for the following components:   Creatinine, Ser 1.07 (*)    All other components within normal limits  URINALYSIS, COMPLETE (UACMP) WITH MICROSCOPIC - Abnormal; Notable for the following components:   Color, Urine YELLOW (*)    APPearance CLOUDY (*)    Hgb urine dipstick LARGE (*)    Protein, ur 30 (*)    Leukocytes,Ua TRACE (*)    RBC / HPF >50 (*)    All other components within normal limits  HCG, QUANTITATIVE, PREGNANCY - Abnormal; Notable for the following  components:   hCG, Beta Chain, Quant, S 4,106 (*)    All other components within normal limits  POC URINE PREG, ED - Abnormal; Notable for the following components:   Preg Test, Ur POSITIVE (*)    All other components within normal limits    ____________________________________________  EKG:  ________________________________________  RADIOLOGY All imaging, including plain  films, CT scans, and ultrasounds, independently reviewed by me, and interpretations confirmed via formal radiology reads.  ED MD interpretation:   Ultrasound: 7-week intrauterine gestation, no fetal heart tones, meeting criteria for failed pregnancy  Official radiology report(s): US OB Comp Less 14 Wks  Result Date: 11/11/2020 CLINICAL DATA:  Bleeding EXAM: OBSTETRIC <14 WK [place] AND TRANSVAGINAL OB US TECHNIQUE: Both transabdominal and transvaginal ultrasound examinations were performed for complete evaluation of the gestation as well as the maternal uterus, adnexal regions, and pelvic cul-de-sac. Transvaginal technique was performed to assess early pregnancy. COMPARISON:  None. FINDINGS: Intrauterine gestational sac: Single Yolk sac:  Visualized Embryo:  Visualized Cardiac Activity: Not visualized Heart Rate:   bpm MSD:   mm    w     d CRL:  9.7 mm   7 w   0 d                  [place] EDC: 06/30/2021 Subchorionic hemorrhage:  None visualized. Maternal uterus/adnexae: No adnexal mass or free fluid. IMPRESSION: Seven week intrauterine gestation. No fetal heart tones detected. Findings meet definitive criteria for failed pregnancy. This follows SRU consensus guidelines: Diagnostic Criteria for Nonviable Pregnancy Early in the First Trimester. Alison Stalling J Med [phone]. Electronically Signed   By: Rolm Baptise M.D.   On: 11/11/2020 08:11   US OB Transvaginal  Result Date: 11/11/2020 CLINICAL DATA:  Bleeding EXAM: OBSTETRIC <14 WK [place] AND TRANSVAGINAL OB US TECHNIQUE: Both transabdominal and transvaginal ultrasound examinations  were performed for complete evaluation of the gestation as well as the maternal uterus, adnexal regions, and pelvic cul-de-sac. Transvaginal technique was performed to assess early pregnancy. COMPARISON:  None. FINDINGS: Intrauterine gestational sac: Single Yolk sac:  Visualized Embryo:  Visualized Cardiac Activity: Not visualized Heart Rate:   bpm MSD:   mm    w     d CRL:  9.7 mm   7 w   0 d                  [place] EDC: 06/30/2021 Subchorionic hemorrhage:  None visualized. Maternal uterus/adnexae: No adnexal mass or free fluid. IMPRESSION: Seven week intrauterine gestation. No fetal heart tones detected. Findings meet definitive criteria for failed pregnancy. This follows SRU consensus guidelines: Diagnostic Criteria for Nonviable Pregnancy Early in the First Trimester. Alison Stalling J Med [phone]. Electronically Signed   By: Rolm Baptise M.D.   On: 11/11/2020 08:11    ____________________________________________  PROCEDURES   Procedure(s) performed (including Critical Care):  Procedures  ____________________________________________  INITIAL IMPRESSION / MDM / [place] / ED COURSE  As part of my medical decision making, I reviewed the following data within the [place] notes reviewed and incorporated, Old chart reviewed, Notes from prior ED visits, and [place] Controlled Substance Database       *Christie Ali was evaluated in Emergency Department on 11/11/2020 for the symptoms described in the history of present illness. She was evaluated in the context of the global COVID-19 pandemic, which necessitated consideration that the patient might be at risk for infection with the SARS-CoV-2 virus that causes COVID-19. Institutional protocols and algorithms that pertain to the evaluation of patients at risk for COVID-19 are in a state of rapid change based on information released by regulatory bodies including the CDC and federal and state organizations. These  policies and algorithms were followed during the patient's care in the ED.  Some ED evaluations and interventions may be  delayed as a result of limited staffing during the pandemic.*     Medical Decision Making: 34 year old G3, P2 here with spontaneous miscarriage at estimated [redacted] weeks gestation.  Ultrasound reviewed by myself, shows crown-rump length of 9.7 mm with no fetal heart activity, consistent with failed pregnancy.  No free fluid or evidence of ectopic or bleeding.  She is B+.  Lab work reassuring with no significant anemia.  No signs of UTI.  No vaginal discharge.  Unfortunately, suspect spontaneous miscarriage in the first trimester.  Os is closed on ultrasound, and she has no severe bleeding here, though her bleeding does seem to have increased over the last several hours and I suspect she will likely spontaneously miscarry in the next day or so.  Discussed management options with the patient.  At this time, will elect for expectant management with supportive care, NSAIDs and Zofran, and have her call her OB.  She may ultimately require Cytotec D&C, but will try natural expectant management at this time.  Return precautions given.  ____________________________________________  FINAL CLINICAL IMPRESSION(S) / ED DIAGNOSES  Final diagnoses:  Spontaneous miscarriage  Vaginal bleeding     MEDICATIONS GIVEN DURING THIS VISIT:  Medications - No data to display   ED Discharge Orders         Ordered    ibuprofen (ADVIL) 800 MG tablet  Every 8 hours PRN        11/11/20 0826    ondansetron (ZOFRAN ODT) 4 MG disintegrating tablet  Every 8 hours PRN        11/11/20 0826           Note:  This document was prepared using Dragon voice recognition software and may include unintentional dictation errors.   Duffy Bruce, MD 11/11/20 [phone]

## 2020-11-11 NOTE — ED Triage Notes (Signed)
Pt in with co vaginal bleeding x 3 days, pt is approx [redacted] weeks pregnant. Pt denies any abd pain.

## 2020-11-11 NOTE — ED Notes (Signed)
Pt returned from [place]. No complaints, call light in reach.

## 2020-11-11 NOTE — Discharge Instructions (Addendum)
Unfortunately, your ultrasound today showed a miscarriage.  I am so sorry for your loss.  As we discussed, for now, I would recommend going home and processing.  Take the Advil and Zofran as needed for pain and nausea.  Call your OB/GYN today to discuss your ultrasound results.  They may want to see you in the clinic to discuss medication options, and will also want to schedule a repeat ultrasound in the near future.  I would expect some bleeding and passage of tissue over the next 24 to 48 hours.  If you bleed through more than 1 pad per hour for more than 6 to 8 hours, return to the ER immediately.  Return to the ER with any questions or concerns.

## 2020-11-11 NOTE — ED Notes (Signed)
Pt states she started spotting started on Sunday with "brown blood," then has progressively increased to now she is wearing a pad and has stayed brown output with some red in it. Has not had this before with previous 2 pregnancies.  No complaints of pain at this time, oriented to room and call bell.

## 2020-11-11 NOTE — ED Notes (Signed)
Pt taken to US

## 2020-11-11 NOTE — ED Notes (Signed)
Dc instructions reviewed with pt, pt tearful due to miscarriage, support offered. Pt verbalized understanding of dc paperwork.

## 2020-11-12 ENCOUNTER — Telehealth: Payer: Self-pay | Admitting: *Deleted

## 2020-11-12 NOTE — Telephone Encounter (Signed)
Transition Care Management Unsuccessful Follow-up Telephone Call  Date of discharge and from where:  11/11/20 [facility] ED  Attempts:  1st Attempt  Reason for unsuccessful TCM follow-up call:  Voice mail full

## 2020-11-14 NOTE — Telephone Encounter (Signed)
Transition Care Management Follow-up Telephone Call  Date of discharge and from where: 11/11/2020 from [facility] ED  How have you been since you were released from the hospital? Pt states that she doing well.   Any questions or concerns? No  Items Reviewed:  Did the pt receive and understand the discharge instructions provided? Yes   Medications obtained and verified? Yes   Other? No   Any new allergies since your discharge? No   Dietary orders reviewed? N/A  Do you have support at home? Yes   Functional Questionnaire: (I = Independent and D = Dependent) ADLs: I  Bathing/Dressing- I  Meal Prep- I  Eating- I  Maintaining continence- I  Transferring/Ambulation- I  Managing Meds- I  Follow up appointments reviewed:   PCP Hospital f/u appt confirmed? No    Are transportation arrangements needed? No   If their condition worsens, is the pt aware to call PCP or go to the Emergency Dept.? Yes  Was the patient provided with contact information for the PCP's office or ED? Yes  Was to pt encouraged to call back with questions or concerns? Yes

## 2020-11-25 ENCOUNTER — Encounter: Payer: Medicaid Other | Admitting: Obstetrics & Gynecology

## 2020-12-18 ENCOUNTER — Telehealth: Payer: Self-pay | Admitting: Family Medicine

## 2020-12-18 NOTE — Telephone Encounter (Signed)
Attempted to reach Christie Ali to get her scheduled for prenatal care. A child answered the phone, and she never came to the phone.

## 2020-12-20 NOTE — L&D Delivery Note (Signed)
Operative Delivery Note Py pushed x 35 minutes. Mild variable w/ pushing. At 5:23 PM a viable and healthy female was delivered via Vaginal, Spontaneous.  Presentation: vertex; Position: Right,, Occiput,, Anterior; Mild shoulder dystocia encountered. After gentle downward traction of the head did not result in delivery of shoulders, the following maneuvers were performed to affect delivery.   Delivery of the head: 08/23/2021  5:22 PM First maneuver: 08/23/2021  5:22 PM, McRoberts Second maneuver: 08/23/2021  5:23 PM, Suprapubic Pressure Third maneuver: 08/23/2021  5:23 PM, Deliver of posterior arm by grasping and rotating left (posterior) axilla counterclockwise.    Infant placed skin-to-skin w/ mom. Stimulated. Strong cry. Delayed cord clamping x 1 minute. Cord clamped x 2 and cut by FOB.   APGAR: 7, 9; weight 8 lb 6.6 oz (3816 g).   Placenta status: spontaneous, intact .   Cord:  with the following complications: None.  Cord pH: NA  Anesthesia: Epidural Episiotomy: None Lacerations: None Suture Repair:  NA Est. Blood Loss (mL): 500  Mom to postpartum.  Baby to Couplet care / Skin to Skin.  Manya Silvas 08/23/2021, 8:40 PM

## 2020-12-20 NOTE — L&D Delivery Note (Deleted)
Delivery Note  Called to room  # 5. Pt C/C/+3. Pt delivered a viable female infant over an intact perineum without problems. Infant to mothers abd. Cord clamped and cut by FOB. Cord blood obtained. 3 vessel cord intact placenta by gentle traction. No laceration or tears noted. EBL 250. Apgars and weight as recorded. Mother and infant recovering in room together.

## 2020-12-22 ENCOUNTER — Other Ambulatory Visit: Payer: Self-pay | Admitting: *Deleted

## 2020-12-22 ENCOUNTER — Ambulatory Visit: Payer: BC Managed Care – PPO

## 2020-12-22 ENCOUNTER — Other Ambulatory Visit: Payer: Self-pay | Admitting: Family Medicine

## 2020-12-22 DIAGNOSIS — O039 Complete or unspecified spontaneous abortion without complication: Secondary | ICD-10-CM | POA: Diagnosis not present

## 2020-12-22 NOTE — Progress Notes (Signed)
Pt came by office to due a UPT due to having  A positive one at home, Pt went to ED on 11/24 for a SAB. Discussed with Dr Shawnie Pons, pt needs trending HCG. Will place order today for HCG

## 2020-12-23 LAB — BETA HCG QUANT (REF LAB): hCG Quant: 1108 m[IU]/mL

## 2020-12-26 ENCOUNTER — Other Ambulatory Visit: Payer: BC Managed Care – PPO

## 2020-12-26 ENCOUNTER — Other Ambulatory Visit: Payer: Self-pay

## 2020-12-26 DIAGNOSIS — O039 Complete or unspecified spontaneous abortion without complication: Secondary | ICD-10-CM

## 2020-12-27 LAB — BETA HCG QUANT (REF LAB): hCG Quant: 4560 m[IU]/mL

## 2021-01-12 ENCOUNTER — Telehealth: Payer: Self-pay | Admitting: *Deleted

## 2021-01-12 MED ORDER — DOXYLAMINE-PYRIDOXINE 10-10 MG PO TBEC: 2.0000 | DELAYED_RELEASE_TABLET | Freq: Every day | ORAL | 2 refills | Status: DC

## 2021-01-12 MED ORDER — PROMETHAZINE HCL 25 MG PO TABS: 25.0000 mg | ORAL_TABLET | Freq: Four times a day (QID) | ORAL | 1 refills | Status: DC | PRN

## 2021-01-12 NOTE — Telephone Encounter (Signed)
Informed pt that we can send in meds to help with her nausea.

## 2021-01-12 NOTE — Telephone Encounter (Signed)
-----   Message from Allena Earing, NT sent at 01/12/2021  2:46 PM EST ----- Regarding: patient requesting nausea med Patient called and left message requesting a medication for her nausea, this is all that she left on her message.    Thanks

## 2021-01-20 DIAGNOSIS — Z20822 Contact with and (suspected) exposure to covid-19: Secondary | ICD-10-CM | POA: Diagnosis not present

## 2021-01-27 ENCOUNTER — Other Ambulatory Visit: Payer: Self-pay

## 2021-01-27 ENCOUNTER — Encounter: Payer: Self-pay | Admitting: Obstetrics & Gynecology

## 2021-01-27 ENCOUNTER — Ambulatory Visit (INDEPENDENT_AMBULATORY_CARE_PROVIDER_SITE_OTHER): Payer: BC Managed Care – PPO | Admitting: Obstetrics & Gynecology

## 2021-01-27 VITALS — BP 131/76 | HR 78 | Wt 176.0 lb

## 2021-01-27 DIAGNOSIS — O98311 Other infections with a predominantly sexual mode of transmission complicating pregnancy, first trimester: Secondary | ICD-10-CM

## 2021-01-27 DIAGNOSIS — A6009 Herpesviral infection of other urogenital tract: Secondary | ICD-10-CM

## 2021-01-27 DIAGNOSIS — R2231 Localized swelling, mass and lump, right upper limb: Secondary | ICD-10-CM

## 2021-01-27 DIAGNOSIS — Z3A09 9 weeks gestation of pregnancy: Secondary | ICD-10-CM

## 2021-01-27 DIAGNOSIS — Z8759 Personal history of other complications of pregnancy, childbirth and the puerperium: Secondary | ICD-10-CM

## 2021-01-27 DIAGNOSIS — Z3480 Encounter for supervision of other normal pregnancy, unspecified trimester: Secondary | ICD-10-CM

## 2021-01-27 MED ORDER — ASPIRIN EC 81 MG PO TBEC: 81.0000 mg | DELAYED_RELEASE_TABLET | Freq: Every day | ORAL | 2 refills | Status: DC

## 2021-01-27 MED ORDER — VALACYCLOVIR HCL 1 G PO TABS: ORAL_TABLET | ORAL | 5 refills | Status: DC

## 2021-01-27 NOTE — Progress Notes (Signed)
History:   Christie Ali is a 35 y.o. [phone] at [redacted]w[redacted]d by early ultrasound done in office today being seen today for her first obstetrical visit.  Her obstetrical history is significant for recent MAB on 11/11/20. Also history of GHTN during last pregnancy.  Patient does intend to breast feed.  Desires waterbirth. Pregnancy history fully reviewed.  Patient reports long history of right axillary lump. Has been evaluated by other providers, thought to be a lipoma. She is concerned as it is a little bigger. FH of breast cancer in both grandmothers.      HISTORY: OB History  Gravida Para Term Preterm AB Living  4 2 2  0 1 2  SAB IAB Ectopic Multiple Live Births  1 0 0 0 2    # Outcome Date GA Lbr Len/2nd Weight Sex Delivery Anes PTL Lv  4 Current           3 SAB 11/11/20          2 Term 08/25/19 [redacted]w[redacted]d 03:30 / 00:29 7 lb 12.3 oz (3.524 kg) M Vag-Spont EPI  LIV     Birth Comments: WNL     Name: LAURAINE, CRESPO     Apgar1: 8  Apgar5: 9  1 Term 06/22/18 [redacted]w[redacted]d 06:11 / 01:15 7 lb 10.4 oz (3.47 kg) F Vag-Spont EPI  LIV     Birth Comments: extra finger on right hand     Name: MEEYAH, OVITT     Apgar1: 7  Apgar5: 9    Last pap smear was done 03/26/2019 and was normal  Past Medical History:  Diagnosis Date  . Asthma    as a child  . Pregnancy induced hypertension    Labor induced @ 40 wks   Past Surgical History:  Procedure Laterality Date  . NO PAST SURGERIES     Family History  Problem Relation Age of Onset  . Hypertension Mother   . Hypertension Father   . Cancer Maternal Grandmother   . Cancer Paternal Grandmother    Social History   Tobacco Use  . Smoking status: Current Every Day Smoker    Packs/day: 0.50    Types: Cigarettes  . Smokeless tobacco: Never Used  Vaping Use  . Vaping Use: Never used  Substance Use Topics  . Alcohol use: No  . Drug use: Yes    Types: Marijuana    Comment: current use - last 2 days ago   Allergies  Allergen Reactions  .  Sulfa Antibiotics Hives  . Other Rash    [place]    Current Outpatient Medications on File Prior to Visit  Medication Sig Dispense Refill  . Prenatal Vit-Fe Fumarate-FA (MULTIVITAMIN-PRENATAL) 27-0.8 MG TABS tablet Take 1 tablet by mouth daily at 12 noon. 30 tablet 11  . acetaminophen (TYLENOL) 325 MG tablet Take 2 tablets (650 mg total) by mouth every 4 (four) hours as needed (for pain scale < 4). (Patient not taking: Reported on 01/27/2021)    . Doxylamine-Pyridoxine (DICLEGIS) 10-10 MG TBEC Take 2 tablets by mouth at bedtime. If symptoms persist, add one tablet in the morning and one in the afternoon (Patient not taking: Reported on 01/27/2021) 100 tablet 2  . ondansetron (ZOFRAN ODT) 4 MG disintegrating tablet Take 1 tablet (4 mg total) by mouth every 8 (eight) hours as needed for nausea or vomiting. (Patient not taking: Reported on 01/27/2021) 12 tablet 0  . promethazine (PHENERGAN) 25 MG tablet Take 1 tablet (25 mg total) by mouth every  6 (six) hours as needed for nausea or vomiting. (Patient not taking: Reported on 01/27/2021) 30 tablet 1   No current facility-administered medications on file prior to visit.    Review of Systems Pertinent items noted in HPI and remainder of comprehensive ROS otherwise negative.  Physical Exam:   Vitals:   01/27/21 1030  BP: 131/76  Pulse: 78  Weight: 176 lb (79.8 kg)   Bedside Ultrasound for FHR check: Viable intrauterine pregnancy with positive cardiac activity noted, Fetal Heart Rate (bpm): 162 bpm Patient informed that the ultrasound is considered a limited obstetric ultrasound and is not intended to be a complete ultrasound exam.  Patient also informed that the ultrasound is not being completed with the intent of assessing for fetal or placental anomalies or any pelvic abnormalities.  Explained that the purpose of today's ultrasound is to assess for fetal heart rate.  Patient acknowledges the purpose of the exam and the limitations of the  study. General: well-developed, well-nourished female in no acute distress  Breasts:  normal appearance, no masses or tenderness bilaterally in the breasts, no nipple discharge; 6 x 7 cm soft and mobile mass palpated in inferior portion of right axilla, no other axillary masses, lymphadenopathy bilaterally   Skin: normal coloration and turgor, no rashes  Neurologic: oriented, normal, negative, normal mood  Extremities: normal strength, tone, and muscle mass, ROM of all joints is normal  HEENT PERRLA, extraocular movement intact and sclera clear, anicteric  Neck supple and no masses  Cardiovascular: regular rate and rhythm  Respiratory:  no respiratory distress, normal breath sounds  Abdomen: soft, non-tender; bowel sounds normal; no masses,  no organomegaly  Pelvic: deferred    Assessment:    Pregnancy: [id number] Patient Active Problem List   Diagnosis Date Noted  . History of gestational hypertension 08/02/2018  . 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 Asthma 11/30/2017     Plan:    1. Mass of right axilla Likely lipoma or other benign subcutaneous mass. Will obtain imaging for further evaluation. Mammogram not ordered for now. - US BREAST LTD UNI RIGHT INC AXILLA; Future  2. Genital herpes affecting pregnancy No recent outbreaks. Valtrex prescribed for any episodes/prodromal symptoms during pregnancy and for suppression. - valACYclovir (VALTREX) 1000 MG tablet; Take one tablet by mouth twice daily for ten days for any episode; then for suppression take one tablet daily for remainder of pregnancy  Dispense: 90 tablet; Refill: 5  3. History of gestational hypertension ASA prescribed. - aspirin EC 81 MG tablet; Take 1 tablet (81 mg total) by mouth daily. Take after 12 weeks for prevention of preeclampsia later in pregnancy  Dispense: 300 tablet; Refill: 2  4. [redacted] weeks gestation of pregnancy 5. Supervision of other normal pregnancy, antepartum Will return in two weeks for NIPS, labs and  repeat evaluation (viability check given recent MAB). - Babyscripts Schedule Optimization  Continue prenatal vitamins. Problem list reviewed and updated. Genetic Screening discussed, NIPS: to be done next visit. Ultrasound discussed; fetal anatomic survey: to be ordered next visit. Anticipatory guidance about prenatal visits given including labs, ultrasounds, and testing. Discussed usage of Babyscripts and virtual visits as additional source of managing and completing prenatal visits in midst of coronavirus and pandemic.   Encouraged to complete MyChart Registration for her ability to review results, send requests, and have questions addressed.  Information about waterbirth given to patient, will meet with CNM for next visit. The nature of [place] for [facility]Faculty Practice with multiple MDs  and Advanced Practice Providers was explained to patient; also emphasized that residents, students are part of our team. Routine obstetric precautions reviewed. Encouraged to seek out care at office or emergency room [facility] preferred) for urgent and/or emergent concerns. Return in about 2 weeks (around 02/10/2021) for OFFICE OB VISIT (CNM only) -- desires waterbirth, prenatal labs and NIPS.     Verita Schneiders, MD, [place] for [employer], [place]

## 2021-01-27 NOTE — Progress Notes (Signed)
Has MAB 11/11/20, did not have cycle in December, did a home UPT end of December and it was positive, We did HCG's on 1/3 and 1/7.   DATING AND VIABILITY SONOGRAM   Christie Ali is a 35 y.o. year old [id number] with LMP unknown. She had a SAB on 11/11/20, and no cycle and then took home UPT in late December which was positive.  She is here today for a confirmatory initial sonogram.    GESTATION: SINGLETON yes  FETAL ACTIVITY:          Heart rate        162          The fetus is current.   GESTATIONAL AGE AND  BIOMETRICS:  Gestational criteria: Estimated Date of Delivery: 08/30/21 by early ultrasound now at [redacted]w[redacted]d  Previous Scans:0  GESTATIONAL SAC            mm          weeks  CROWN RUMP LENGTH           2.56cm         9.2 weeks                                                   AVERAGE EGA(BY THIS SCAN):  9.2 weeks  WORKING EDD( early ultrasound ):  08/30/2021     TECHNICIAN COMMENTS:  Patient informed that the ultrasound is considered a limited obstetric ultrasound and is not intended to be a complete ultrasound exam.  Patient also informed that the ultrasound is not being completed with the intent of assessing for fetal or placental anomalies or any pelvic abnormalities. Explained that the purpose of today's ultrasound is to assess for fetal heart rate.  Patient acknowledges the purpose of the exam and the limitations of the study.           A copy of this report including all images has been saved and backed up to a second source for retrieval if needed. All measures and details of the anatomical scan, placentation, fluid volume and pelvic anatomy are contained in that report.  Crosby Oyster 01/27/2021 10:45 AM

## 2021-01-27 NOTE — Patient Instructions (Addendum)
First Trimester of Pregnancy  The first trimester of pregnancy starts on the first day of your last menstrual period until the end of week 12. This is months 1 through 3 of pregnancy. A week after a sperm fertilizes an egg, the egg will implant into the wall of the uterus and begin to develop into a baby. By the end of 12 weeks, all the baby's organs will be formed and the baby will be 2-3 inches in size. Body changes during your first trimester Your body goes through many changes during pregnancy. The changes vary and generally return to normal after your baby is born. Physical changes  You may gain or lose weight.  Your breasts may begin to grow larger and become tender. The tissue that surrounds your nipples (areola) may become darker.  Dark spots or blotches (chloasma or mask of pregnancy) may develop on your face.  You may have changes in your hair. These can include thickening or thinning of your hair or changes in texture. Health changes  You may feel nauseous, and you may vomit.  You may have heartburn.  You may develop headaches.  You may develop constipation.  Your gums may bleed and may be sensitive to brushing and flossing. Other changes  You may tire easily.  You may urinate more often.  Your menstrual periods will stop.  You may have a loss of appetite.  You may develop cravings for certain kinds of food.  You may have changes in your emotions from day to day.  You may have more vivid and strange dreams. Follow these instructions at home: Medicines  Follow your health care provider's instructions regarding medicine use. Specific medicines may be either safe or unsafe to take during pregnancy. Do not take any medicines unless told to by your health care provider.  Take a prenatal vitamin that contains at least 600 micrograms (mcg) of folic acid. Eating and drinking  Eat a healthy diet that includes fresh fruits and vegetables, whole grains, good sources of  protein such as meat, eggs, or tofu, and low-fat dairy products.  Avoid raw meat and unpasteurized juice, milk, and cheese. These carry germs that can harm you and your baby.  If you feel nauseous or you vomit: ? Eat 4 or 5 small meals a day instead of 3 large meals. ? Try eating a few soda crackers. ? Drink liquids between meals instead of during meals.  You may need to take these actions to prevent or treat constipation: ? Drink enough fluid to keep your urine pale yellow. ? Eat foods that are high in fiber, such as beans, whole grains, and fresh fruits and vegetables. ? Limit foods that are high in fat and processed sugars, such as fried or sweet foods. Activity  Exercise only as directed by your health care provider. Most people can continue their usual exercise routine during pregnancy. Try to exercise for 30 minutes at least 5 days a week.  Stop exercising if you develop pain or cramping in the lower abdomen or lower back.  Avoid exercising if it is very hot or humid or if you are at high altitude.  Avoid heavy lifting.  If you choose to, you may have sex unless your health care provider tells you not to. Relieving pain and discomfort  Wear a good support bra to relieve breast tenderness.  Rest with your legs elevated if you have leg cramps or low back pain.  If you develop bulging veins (varicose veins) in  your legs: ? Wear support hose as told by your health care provider. ? Elevate your feet for 15 minutes, 3-4 times a day. ? Limit salt in your diet. Safety  Wear your seat belt at all times when driving or riding in a car.  Talk with your health care provider if someone is verbally or physically abusive to you.  Talk with your health care provider if you are feeling sad or have thoughts of hurting yourself. Lifestyle  Do not use hot tubs, steam rooms, or saunas.  Do not douche. Do not use tampons or scented sanitary pads.  Do not use herbal remedies, alcohol,  illegal drugs, or medicines that are not approved by your health care provider. Chemicals in these products can harm your baby.  Do not use any products that contain nicotine or tobacco, such as cigarettes, e-cigarettes, and chewing tobacco. If you need help quitting, ask your health care provider.  Avoid cat litter boxes and soil used by cats. These carry germs that can cause birth defects in the baby and possibly loss of the unborn baby (fetus) by miscarriage or stillbirth. General instructions  During routine prenatal visits in the first trimester, your health care provider will do a physical exam, perform necessary tests, and ask you how things are going. Keep all follow-up visits. This is important.  Ask for help if you have counseling or nutritional needs during pregnancy. Your health care provider can offer advice or refer you to specialists for help with various needs.  Schedule a dentist appointment. At home, brush your teeth with a soft toothbrush. Floss gently.  Write down your questions. Take them to your prenatal visits. Where to find more information  American Pregnancy Association: americanpregnancy.[place] and Gynecologists: PoolDevices.com.pt  Office on [employer]: KeywordPortfolios.com.br Contact a health care provider if you have:  Dizziness.  A fever.  Mild pelvic cramps, pelvic pressure, or nagging pain in the abdominal area.  Nausea, vomiting, or diarrhea that lasts for 24 hours or longer.  A bad-smelling vaginal discharge.  Pain when you urinate.  Known exposure to a contagious illness, such as chickenpox, measles, Zika virus, HIV, or hepatitis. Get help right away if you have:  Spotting or bleeding from your vagina.  Severe abdominal cramping or pain.  Shortness of breath or chest pain.  Any kind of trauma, such as from a fall or a car crash.  New or increased pain, swelling, or redness in an  arm or leg. Summary  The first trimester of pregnancy starts on the first day of your last menstrual period until the end of week 12 (months 1 through 3).  Eating 4 or 5 small meals a day rather than 3 large meals may help to relieve nausea and vomiting.  Do not use any products that contain nicotine or tobacco, such as cigarettes, e-cigarettes, and chewing tobacco. If you need help quitting, ask your health care provider.  Keep all follow-up visits. This is important. This information is not intended to replace advice given to you by your health care provider. Make sure you discuss any questions you have with your health care provider. Document Revised: 05/14/2020 Document Reviewed: 03/20/2020 Elsevier Patient Education  2021 [place]? Guide for patients at [facility] [employer] [facility]) Why consider waterbirth? . Gentle birth for babies  . Less pain medicine used in labor  . May allow for passive descent/less pushing  . May reduce perineal tears  .  More mobility and instinctive maternal position changes  . Increased maternal relaxation   Is waterbirth safe? What are the risks of infection, drowning or other complications? . Infection:  Marland Kitchen Very low risk (3.7 % for tub vs 4.8% for bed)  . 7 in 42 waterbirths with documented infection  . Poorly cleaned equipment most common cause  . Slightly lower group B strep transmission rate  . Drowning  . Maternal:  . Very low risk  . Related to seizures or fainting  . Newborn:  Marland Kitchen Very low risk. No evidence of increased risk of respiratory problems in multiple large studies  . Physiological protection from breathing under water  . Avoid underwater birth if there are any fetal complications  . Once baby's head is out of the water, keep it out.  . Birth complication  . Some reports of cord trauma, but risk decreased by bringing baby to surface gradually  . No evidence of increased risk of shoulder  dystocia. Mothers can usually change positions faster in water than in a bed, possibly aiding the maneuvers to free the shoulder.   There are 2 things you MUST do to have a waterbirth with [facility]: 1. Attend a waterbirth class at [place] [facility]   a. 3rd Wednesday of every month from 7-9 pm (virtual during [place]) b. Free c. Register by calling [phone] or register online at [email or web address] d. Bring [place] the certificate from the class to your prenatal appointment or send via MyChart 2. Meet with a midwife at 36 weeks* to see if you can still plan a waterbirth and to sign the consent.   *We also recommend that you schedule as many of your prenatal visits with a midwife as possible.    Helpful information: . You may want to bring a bathing suit top to the hospital to wear during labor but this is optional.  All other supplies are provided by the hospital. . Please arrive at the hospital with signs of active labor, and do not wait at home until late in labor. It takes 45 min- 2 hours for COVID testing, fetal monitoring, and check in to your room to take place, plus transport and filling of the waterbirth tub.    Things that would prevent you from having a waterbirth: . Unknown or Positive COVID-19 diagnosis upon admission to hospital* . Premature, <37wks  . Previous cesarean birth  . Presence of thick meconium-stained fluid  . Multiple gestation (Twins, triplets, etc.)  . Uncontrolled diabetes or gestational diabetes requiring medication  . Hypertension diagnosed in pregnancy or preexisting hypertension (gestational hypertension, preeclampsia, or chronic hypertension) . Heavy vaginal bleeding  . Non-reassuring fetal heart rate  . Active infection (MRSA, etc.). Group B Strep is NOT a contraindication for waterbirth.  . If your labor has to be induced and induction method requires continuous monitoring of the baby's heart rate  . Other risks/issues identified  by your obstetrical provider   Please remember that birth is unpredictable. Under certain unforeseeable circumstances your provider may advise against giving birth in the tub. These decisions will be made on a case-by-case basis and with the safety of you and your baby as our highest priority.   *Please remember that in order to have a waterbirth, you must test Negative to COVID-19 upon admission to the hospital.  Updated 11/04/2020

## 2021-01-29 ENCOUNTER — Ambulatory Visit
Admission: RE | Admit: 2021-01-29 | Discharge: 2021-01-29 | Disposition: A | Discharge status: Home / Self Care | Payer: BC Managed Care – PPO | Source: Ambulatory Visit | Attending: Obstetrics & Gynecology | Admitting: Obstetrics & Gynecology

## 2021-01-29 ENCOUNTER — Other Ambulatory Visit: Payer: Self-pay

## 2021-01-29 DIAGNOSIS — N6310 Unspecified lump in the right breast, unspecified quadrant: Secondary | ICD-10-CM | POA: Diagnosis not present

## 2021-01-29 DIAGNOSIS — R2231 Localized swelling, mass and lump, right upper limb: Secondary | ICD-10-CM | POA: Diagnosis not present

## 2021-02-11 ENCOUNTER — Other Ambulatory Visit: Payer: Self-pay

## 2021-02-11 ENCOUNTER — Other Ambulatory Visit (HOSPITAL_COMMUNITY)
Admission: RE | Admit: 2021-02-11 | Discharge: 2021-02-11 | Disposition: A | Discharge status: Home / Self Care | Payer: BC Managed Care – PPO | Source: Ambulatory Visit | Attending: Family Medicine | Admitting: Family Medicine

## 2021-02-11 ENCOUNTER — Encounter: Payer: BC Managed Care – PPO | Admitting: Family Medicine

## 2021-02-11 ENCOUNTER — Ambulatory Visit (INDEPENDENT_AMBULATORY_CARE_PROVIDER_SITE_OTHER): Payer: BC Managed Care – PPO | Admitting: Family Medicine

## 2021-02-11 VITALS — BP 128/74 | HR 70 | Wt 175.0 lb

## 2021-02-11 DIAGNOSIS — Z3481 Encounter for supervision of other normal pregnancy, first trimester: Secondary | ICD-10-CM | POA: Diagnosis not present

## 2021-02-11 DIAGNOSIS — Z3480 Encounter for supervision of other normal pregnancy, unspecified trimester: Secondary | ICD-10-CM | POA: Insufficient documentation

## 2021-02-11 DIAGNOSIS — O0990 Supervision of high risk pregnancy, unspecified, unspecified trimester: Secondary | ICD-10-CM | POA: Insufficient documentation

## 2021-02-11 DIAGNOSIS — Z8759 Personal history of other complications of pregnancy, childbirth and the puerperium: Secondary | ICD-10-CM

## 2021-02-11 NOTE — Progress Notes (Signed)
   PRENATAL VISIT NOTE  Subjective:  Christie Ali is a 35 y.o. [phone] at [redacted]w[redacted]d being seen today for ongoing prenatal care.  She is currently monitored for the following issues for this low-risk pregnancy and has Asthma; HSV-2 infection; History of gestational hypertension; and Supervision of other normal pregnancy, antepartum on their problem list.  Patient reports no complaints.  Contractions: Not present. Vag. Bleeding: None.   . Denies leaking of fluid.   The following portions of the patient's history were reviewed and updated as appropriate: allergies, current medications, past family history, past medical history, past social history, past surgical history and problem list.   Objective:   Vitals:   02/11/21 1354  BP: 128/74  Pulse: 70  Weight: 175 lb (79.4 kg)    Fetal Status: Fetal Heart Rate (bpm): 152         General:  Alert, oriented and cooperative. Patient is in no acute distress.  Skin: Skin is warm and dry. No rash noted.   Cardiovascular: Normal heart rate noted  Respiratory: Normal respiratory effort, no problems with respiration noted  Abdomen: Soft, gravid, appropriate for gestational age.  Pain/Pressure: Absent     Pelvic: Cervical exam deferred        Extremities: Normal range of motion.  Edema: None  Mental Status: Normal mood and affect. Normal behavior. Normal judgment and thought content.   Assessment and Plan:  Pregnancy: [id number] at [redacted]w[redacted]d 1. Supervision of other normal pregnancy, antepartum Labs today Nausea is present, no vomiting Reports poor appetite.  - CBC/D/Plt+RPR+Rh+ABO+Rub Ab... - Culture, OB Urine - Genetic Screening - GC/Chlamydia probe amp ([place])not at [facility] - [place] MFM OB COMP + 14 WK; Future  2. History of gestational hypertension Start ASA in 1 week  Preterm labor symptoms and general obstetric precautions including but not limited to vaginal bleeding, contractions, leaking of fluid and fetal movement were reviewed in detail  with the patient. Please refer to After Visit Summary for other counseling recommendations.   Return in about 4 weeks (around 03/11/2021) for Routine prenatal care, MD or APP.  Future Appointments  Date Time Provider [place]  03/11/2021  8:15 AM Caren Macadam, MD [facility]  04/06/2021  8:00 AM [facility] NURSE [facility]  04/06/2021  8:15 AM [facility] US2 [facility]    Caren Macadam, MD

## 2021-02-12 LAB — CBC/D/PLT+RPR+RH+ABO+RUB AB...
Antibody Screen: NEGATIVE
Basophils Absolute: 0.1 10*3/uL (ref 0.0–0.2)
Basos: 0 %
EOS (ABSOLUTE): 0.4 10*3/uL (ref 0.0–0.4)
Eos: 3 %
HCV Ab: 0.1 s/co ratio (ref 0.0–0.9)
HIV Screen 4th Generation wRfx: NONREACTIVE
Hematocrit: 41.6 % (ref 34.0–46.6)
Hemoglobin: 14.3 g/dL (ref 11.1–15.9)
Hepatitis B Surface Ag: NEGATIVE
Immature Grans (Abs): 0 10*3/uL (ref 0.0–0.1)
Immature Granulocytes: 0 %
Lymphocytes Absolute: 4.5 10*3/uL — ABNORMAL HIGH (ref 0.7–3.1)
Lymphs: 32 %
MCH: 31 pg (ref 26.6–33.0)
MCHC: 34.4 g/dL (ref 31.5–35.7)
MCV: 90 fL (ref 79–97)
Monocytes Absolute: 0.7 10*3/uL (ref 0.1–0.9)
Monocytes: 5 %
Neutrophils Absolute: 8.4 10*3/uL — ABNORMAL HIGH (ref 1.4–7.0)
Neutrophils: 60 %
Platelets: 266 10*3/uL (ref 150–450)
RBC: 4.61 x10E6/uL (ref 3.77–5.28)
RDW: 11.9 % (ref 11.7–15.4)
RPR Ser Ql: NONREACTIVE
Rh Factor: POSITIVE
Rubella Antibodies, IGG: 3.14 index (ref >0.99)
WBC: 14.1 10*3/uL — ABNORMAL HIGH (ref 3.4–10.8)

## 2021-02-12 LAB — GC/CHLAMYDIA PROBE AMP (~~LOC~~) NOT AT ARMC
Chlamydia: NEGATIVE
Comment: NEGATIVE
Comment: NORMAL
Neisseria Gonorrhea: NEGATIVE

## 2021-02-12 LAB — HCV INTERPRETATION

## 2021-02-13 LAB — CULTURE, OB URINE

## 2021-02-13 LAB — URINE CULTURE, OB REFLEX

## 2021-02-17 ENCOUNTER — Encounter: Payer: Self-pay | Admitting: Radiology

## 2021-02-17 ENCOUNTER — Telehealth: Payer: Self-pay | Admitting: Radiology

## 2021-02-17 NOTE — Telephone Encounter (Signed)
Left message for patient to call [facility] for Panorama results

## 2021-02-27 IMAGING — US US BREAST*R* LIMITED INC AXILLA
1 series · 3 of 3 positions shown · non-contrast
Comparison: None.

CLINICAL DATA: Palpable right axillary mass.

EXAM:
ULTRASOUND OF THE RIGHT BREAST

[Series 1: us breast*right* limited inc axilla · 0.07mm/px · 3 of 3 slices shown]
[im 1/3]
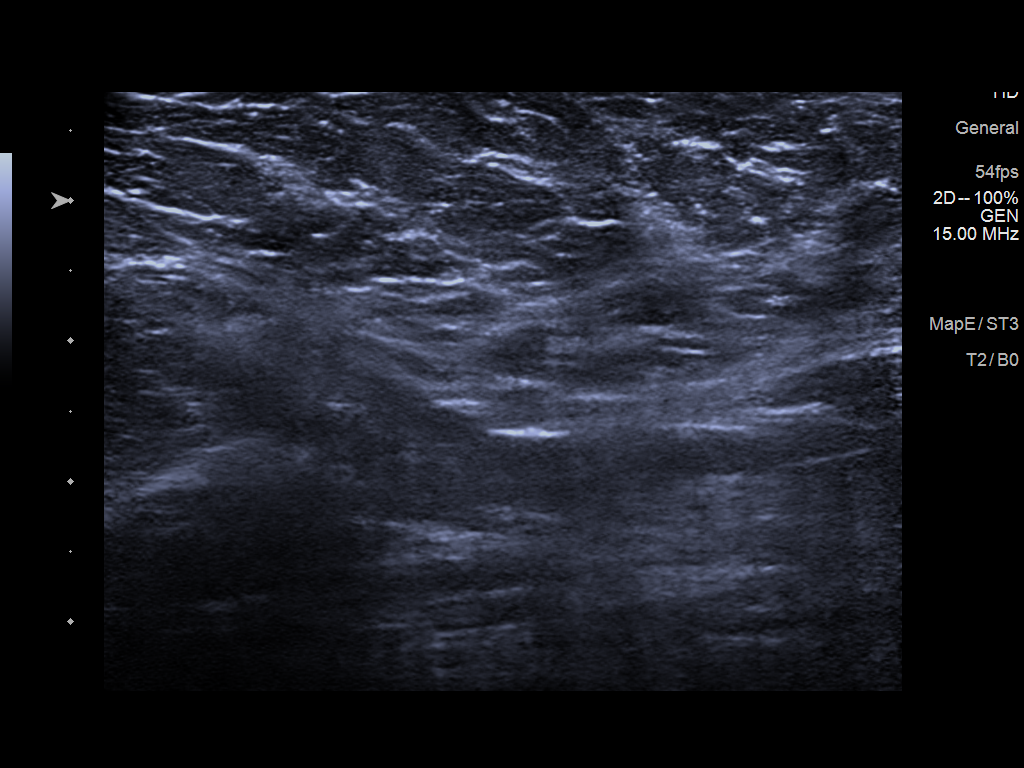
[im 2/3]
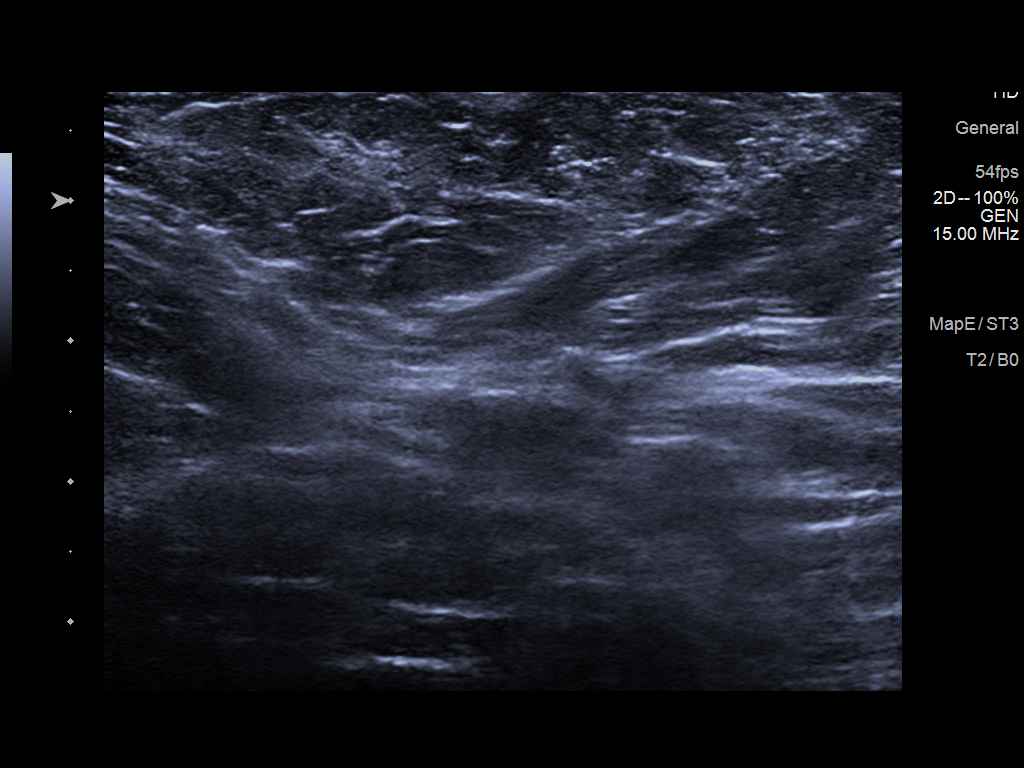
[im 3/3]
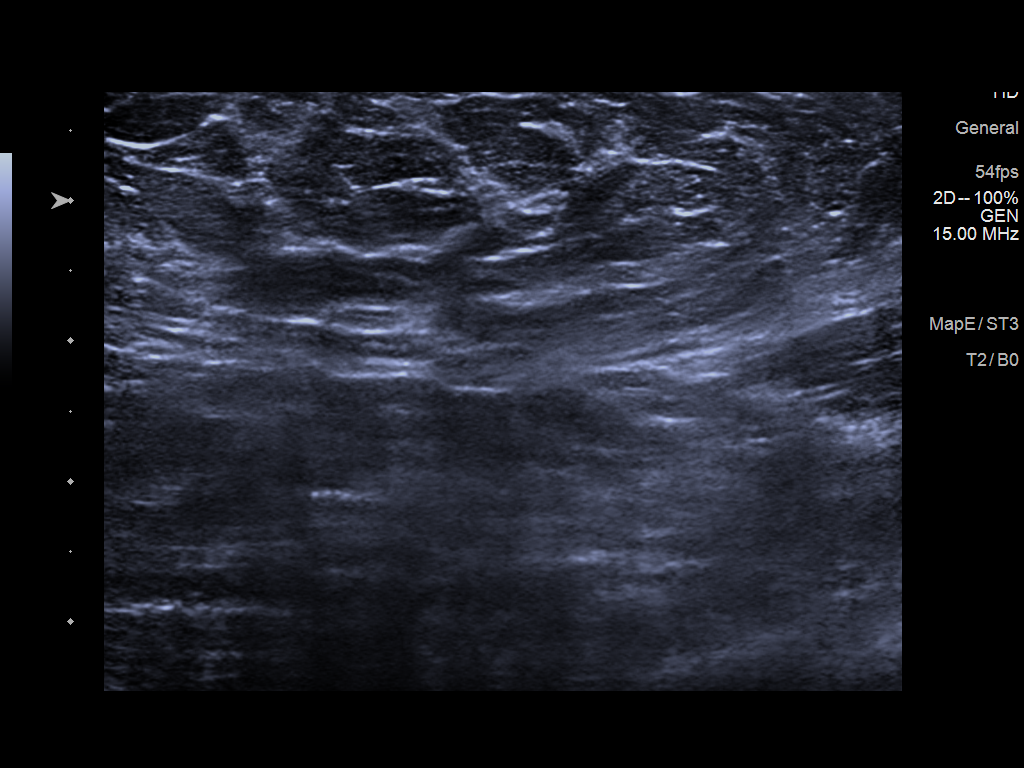

[3 of 3 positions shown; findings below may reference images not displayed]

FINDINGS: On physical exam, there is a soft circumscribed protruding mass in
the right axilla measuring approximately 6-7 cm.

Targeted right axillary ultrasound is performed, showing no
suspicious masses or shadowing lesions. No discrete mass is seen
sonographically at the site of the palpable mass on physical exam.
IMPRESSION: No sonographically apparent mass in the right axilla. This may
represent a lipoma, which is obscured by the adjacent subcutaneous
tissue.

RECOMMENDATION:
Clinical follow-up.

I have discussed the findings and recommendations with the patient.
If applicable, a reminder letter will be sent to the patient
regarding the next appointment.

BI-RADS CATEGORY  2: Benign.

## 2021-03-05 ENCOUNTER — Encounter: Payer: Self-pay | Admitting: Family Medicine

## 2021-03-05 DIAGNOSIS — Z3480 Encounter for supervision of other normal pregnancy, unspecified trimester: Secondary | ICD-10-CM

## 2021-03-11 ENCOUNTER — Encounter: Payer: Self-pay | Admitting: Family Medicine

## 2021-03-11 ENCOUNTER — Other Ambulatory Visit: Payer: Self-pay

## 2021-03-11 ENCOUNTER — Ambulatory Visit (INDEPENDENT_AMBULATORY_CARE_PROVIDER_SITE_OTHER): Payer: Medicaid Other | Admitting: Family Medicine

## 2021-03-11 VITALS — BP 109/70 | HR 76 | Wt 178.6 lb

## 2021-03-11 DIAGNOSIS — Z8759 Personal history of other complications of pregnancy, childbirth and the puerperium: Secondary | ICD-10-CM

## 2021-03-11 DIAGNOSIS — B009 Herpesviral infection, unspecified: Secondary | ICD-10-CM

## 2021-03-11 DIAGNOSIS — J301 Allergic rhinitis due to pollen: Secondary | ICD-10-CM

## 2021-03-11 DIAGNOSIS — Z3480 Encounter for supervision of other normal pregnancy, unspecified trimester: Secondary | ICD-10-CM

## 2021-03-11 MED ORDER — CETIRIZINE HCL 10 MG PO TABS: 10.0000 mg | ORAL_TABLET | Freq: Every day | ORAL | 11 refills | Status: DC

## 2021-03-11 MED ORDER — FLUTICASONE PROPIONATE 50 MCG/ACT NA SUSP: 2.0000 | Freq: Every day | NASAL | 2 refills | Status: DC

## 2021-03-11 NOTE — Progress Notes (Signed)
   PRENATAL VISIT NOTE  Subjective:  Christie Ali is a 35 y.o. [phone] at [redacted]w[redacted]d being seen today for ongoing prenatal care.  She is currently monitored for the following issues for this low-risk pregnancy and has Asthma; HSV-2 infection; History of gestational hypertension; and Supervision of other normal pregnancy, antepartum on their problem list.  Patient reports no complaints.  Contractions: Not present. Vag. Bleeding: None.   . Denies leaking of fluid.   The following portions of the patient's history were reviewed and updated as appropriate: allergies, current medications, past family history, past medical history, past social history, past surgical history and problem list.   Objective:   Vitals:   03/11/21 0817  BP: 109/70  Pulse: 76  Weight: 178 lb 9.6 oz (81 kg)    Fetal Status: Fetal Heart Rate (bpm): 156         General:  Alert, oriented and cooperative. Patient is in no acute distress.  Skin: Skin is warm and dry. No rash noted.   Cardiovascular: Normal heart rate noted  Respiratory: Normal respiratory effort, no problems with respiration noted  Abdomen: Soft, gravid, appropriate for gestational age.  Pain/Pressure: Absent     Pelvic: Cervical exam deferred        Extremities: Normal range of motion.  Edema: None  Mental Status: Normal mood and affect. Normal behavior. Normal judgment and thought content.   Assessment and Plan:  Pregnancy: [id number] at [redacted]w[redacted]d  1. Supervision of other normal pregnancy, antepartum Up to date Reviewed WB process with patient. She signed up for class. We discussed she cannot get epidural if she is in the tub. Has epidural x2 with prior pregnancies.  Discussed contraception-- wants IUD and reviewed post placental placement and she likes this idea.  2. History of gestational hypertension BP wl today  3. HSV-2 infection Will need ppx at 36 wks.   Preterm labor symptoms and general obstetric precautions including but not limited to  vaginal bleeding, contractions, leaking of fluid and fetal movement were reviewed in detail with the patient. Please refer to After Visit Summary for other counseling recommendations.   Return in about 4 weeks (around 04/08/2021) for Routine prenatal care.  Future Appointments  Date Time Provider [place]  04/06/2021  8:00 AM [facility] NURSE [facility]  04/06/2021  8:15 AM [facility] US2 [facility]  04/08/2021  9:00 AM Caren Macadam, MD [facility]    Caren Macadam, MD

## 2021-04-01 ENCOUNTER — Other Ambulatory Visit (HOSPITAL_COMMUNITY)
Admission: RE | Admit: 2021-04-01 | Discharge: 2021-04-01 | Disposition: A | Discharge status: Home / Self Care | Payer: Medicaid Other | Source: Ambulatory Visit | Attending: Advanced Practice Midwife | Admitting: Advanced Practice Midwife

## 2021-04-01 ENCOUNTER — Ambulatory Visit (INDEPENDENT_AMBULATORY_CARE_PROVIDER_SITE_OTHER): Payer: Medicaid Other

## 2021-04-01 ENCOUNTER — Other Ambulatory Visit: Payer: Self-pay

## 2021-04-01 VITALS — BP 110/69 | HR 81

## 2021-04-01 DIAGNOSIS — Z113 Encounter for screening for infections with a predominantly sexual mode of transmission: Secondary | ICD-10-CM

## 2021-04-01 DIAGNOSIS — N898 Other specified noninflammatory disorders of vagina: Secondary | ICD-10-CM

## 2021-04-01 DIAGNOSIS — M549 Dorsalgia, unspecified: Secondary | ICD-10-CM

## 2021-04-01 DIAGNOSIS — O99891 Other specified diseases and conditions complicating pregnancy: Secondary | ICD-10-CM

## 2021-04-01 LAB — POCT URINALYSIS DIPSTICK OB
Glucose, UA: NEGATIVE
Ketones, UA: NEGATIVE
Leukocytes, UA: NEGATIVE
Nitrite, UA: NEGATIVE

## 2021-04-01 NOTE — Progress Notes (Signed)
SUBJECTIVE:  35 y.o. female complains of vaginal itching x2 days and back pain. Denies abnormal vaginal bleeding or significant pelvic pain or fever. No UTI symptoms. Denies history of known exposure to STD.  No LMP recorded (lmp unknown). Patient is pregnant.  OBJECTIVE:  She appears well, afebrile. Urine dipstick: negative for all components.  ASSESSMENT:  Vaginal Itching  PLAN:  GC, chlamydia, trichomonas, BVAG, CVAG probe sent to lab. Treatment: To be determined once lab results are received ROV prn if symptoms persist or worsen.

## 2021-04-01 NOTE — Progress Notes (Signed)
Patient was assessed and managed by nursing staff during this encounter. I have reviewed the chart and agree with the documentation and plan. I have also made any necessary editorial changes.  Verita Schneiders, MD 04/01/2021 10:43 AM

## 2021-04-02 ENCOUNTER — Other Ambulatory Visit: Payer: Self-pay | Admitting: Obstetrics & Gynecology

## 2021-04-02 DIAGNOSIS — O23592 Infection of other part of genital tract in pregnancy, second trimester: Secondary | ICD-10-CM

## 2021-04-02 LAB — CERVICOVAGINAL ANCILLARY ONLY
Bacterial Vaginitis (gardnerella): NEGATIVE
Candida Glabrata: NEGATIVE
Candida Vaginitis: POSITIVE — AB
Chlamydia: NEGATIVE
Comment: NEGATIVE
Comment: NEGATIVE
Comment: NEGATIVE
Comment: NEGATIVE
Comment: NEGATIVE
Comment: NORMAL
Neisseria Gonorrhea: NEGATIVE
Trichomonas: NEGATIVE

## 2021-04-02 MED ORDER — TERCONAZOLE 0.8 % VA CREA: 1.0000 | TOPICAL_CREAM | Freq: Every day | VAGINAL | 1 refills | Status: DC

## 2021-04-06 ENCOUNTER — Ambulatory Visit: Payer: Medicaid Other | Admitting: *Deleted

## 2021-04-06 ENCOUNTER — Other Ambulatory Visit: Payer: Self-pay | Admitting: Family Medicine

## 2021-04-06 ENCOUNTER — Other Ambulatory Visit: Payer: Self-pay

## 2021-04-06 ENCOUNTER — Ambulatory Visit: Payer: Medicaid Other | Attending: Family Medicine

## 2021-04-06 ENCOUNTER — Other Ambulatory Visit: Payer: Self-pay | Admitting: *Deleted

## 2021-04-06 ENCOUNTER — Encounter: Payer: Self-pay | Admitting: *Deleted

## 2021-04-06 VITALS — BP 119/67 | HR 75

## 2021-04-06 DIAGNOSIS — Z3480 Encounter for supervision of other normal pregnancy, unspecified trimester: Secondary | ICD-10-CM | POA: Insufficient documentation

## 2021-04-06 DIAGNOSIS — Z8759 Personal history of other complications of pregnancy, childbirth and the puerperium: Secondary | ICD-10-CM

## 2021-04-08 ENCOUNTER — Ambulatory Visit (INDEPENDENT_AMBULATORY_CARE_PROVIDER_SITE_OTHER): Payer: Medicaid Other | Admitting: Family Medicine

## 2021-04-08 ENCOUNTER — Other Ambulatory Visit: Payer: Self-pay

## 2021-04-08 ENCOUNTER — Encounter: Payer: Self-pay | Admitting: Family Medicine

## 2021-04-08 VITALS — BP 114/72 | HR 78 | Wt 184.4 lb

## 2021-04-08 DIAGNOSIS — Z3480 Encounter for supervision of other normal pregnancy, unspecified trimester: Secondary | ICD-10-CM | POA: Diagnosis not present

## 2021-04-08 NOTE — Progress Notes (Signed)
   PRENATAL VISIT NOTE  Subjective:  Christie Ali is a 35 y.o. [phone] at [redacted]w[redacted]d being seen today for ongoing prenatal care.  She is currently monitored for the following issues for this low-risk pregnancy and has Asthma; HSV-2 infection; History of gestational hypertension; and Supervision of other normal pregnancy, antepartum on their problem list.  Patient reports no complaints.  Contractions: Not present. Vag. Bleeding: None.  Movement: Present. Denies leaking of fluid.   The following portions of the patient's history were reviewed and updated as appropriate: allergies, current medications, past family history, past medical history, past social history, past surgical history and problem list.   Objective:   Vitals:   04/08/21 0923  BP: 114/72  Pulse: 78  Weight: 184 lb 6.4 oz (83.6 kg)    Fetal Status: Fetal Heart Rate (bpm): 141   Movement: Present     General:  Alert, oriented and cooperative. Patient is in no acute distress.  Skin: Skin is warm and dry. No rash noted.   Cardiovascular: Normal heart rate noted  Respiratory: Normal respiratory effort, no problems with respiration noted  Abdomen: Soft, gravid, appropriate for gestational age.  Pain/Pressure: Absent     Pelvic: Cervical exam deferred        Extremities: Normal range of motion.  Edema: None  Mental Status: Normal mood and affect. Normal behavior. Normal judgment and thought content.   Assessment and Plan:  Pregnancy: [id number] at [redacted]w[redacted]d 1. Supervision of other normal pregnancy, antepartum Up to date Anatomy scan WNL, needs face images-- scheduled in may for [place] Has signed up for WB class and will attend in May - AFP, Serum, Open Spina Bifida  Preterm labor symptoms and general obstetric precautions including but not limited to vaginal bleeding, contractions, leaking of fluid and fetal movement were reviewed in detail with the patient. Please refer to After Visit Summary for other counseling recommendations.    Return in about 4 weeks (around 05/06/2021) for Routine prenatal care, OK to do virtual if desired.  Future Appointments  Date Time Provider [place]  05/04/2021  7:15 AM [facility] NURSE [facility]  05/04/2021  7:30 AM [facility] US3 [facility] [place]    Caren Macadam, MD

## 2021-04-10 LAB — AFP, SERUM, OPEN SPINA BIFIDA
AFP MoM: 0.84
AFP Value: 42.5 ng/mL
Gest. Age on Collection Date: 19 weeks
Maternal Age At EDD: 34.2 yr
OSBR Risk 1 IN: 10000
Test Results:: NEGATIVE
Weight: 184 [lb_av]

## 2021-04-22 ENCOUNTER — Encounter: Payer: Self-pay | Admitting: Family Medicine

## 2021-04-23 ENCOUNTER — Encounter: Payer: Self-pay | Admitting: Radiology

## 2021-05-04 ENCOUNTER — Ambulatory Visit: Payer: Medicaid Other | Attending: Obstetrics

## 2021-05-04 ENCOUNTER — Other Ambulatory Visit: Payer: Self-pay

## 2021-05-04 ENCOUNTER — Encounter: Payer: Self-pay | Admitting: *Deleted

## 2021-05-04 ENCOUNTER — Ambulatory Visit: Payer: Medicaid Other | Admitting: *Deleted

## 2021-05-04 VITALS — BP 119/64 | HR 73

## 2021-05-04 DIAGNOSIS — O99512 Diseases of the respiratory system complicating pregnancy, second trimester: Secondary | ICD-10-CM | POA: Diagnosis not present

## 2021-05-04 DIAGNOSIS — O09892 Supervision of other high risk pregnancies, second trimester: Secondary | ICD-10-CM | POA: Diagnosis not present

## 2021-05-04 DIAGNOSIS — J45909 Unspecified asthma, uncomplicated: Secondary | ICD-10-CM

## 2021-05-04 DIAGNOSIS — Z8759 Personal history of other complications of pregnancy, childbirth and the puerperium: Secondary | ICD-10-CM

## 2021-05-04 DIAGNOSIS — O09523 Supervision of elderly multigravida, third trimester: Secondary | ICD-10-CM

## 2021-05-04 DIAGNOSIS — Z362 Encounter for other antenatal screening follow-up: Secondary | ICD-10-CM

## 2021-05-04 DIAGNOSIS — Z3A23 23 weeks gestation of pregnancy: Secondary | ICD-10-CM

## 2021-05-04 DIAGNOSIS — O09292 Supervision of pregnancy with other poor reproductive or obstetric history, second trimester: Secondary | ICD-10-CM

## 2021-05-05 ENCOUNTER — Other Ambulatory Visit: Payer: Self-pay | Admitting: *Deleted

## 2021-05-05 DIAGNOSIS — Q261 Persistent left superior vena cava: Secondary | ICD-10-CM

## 2021-05-06 ENCOUNTER — Telehealth (INDEPENDENT_AMBULATORY_CARE_PROVIDER_SITE_OTHER): Payer: Medicaid Other | Admitting: Family Medicine

## 2021-05-06 ENCOUNTER — Other Ambulatory Visit: Payer: Self-pay

## 2021-05-06 DIAGNOSIS — O09292 Supervision of pregnancy with other poor reproductive or obstetric history, second trimester: Secondary | ICD-10-CM

## 2021-05-06 DIAGNOSIS — O98512 Other viral diseases complicating pregnancy, second trimester: Secondary | ICD-10-CM

## 2021-05-06 DIAGNOSIS — Z3480 Encounter for supervision of other normal pregnancy, unspecified trimester: Secondary | ICD-10-CM

## 2021-05-06 DIAGNOSIS — B009 Herpesviral infection, unspecified: Secondary | ICD-10-CM

## 2021-05-06 DIAGNOSIS — Z3A23 23 weeks gestation of pregnancy: Secondary | ICD-10-CM

## 2021-05-06 DIAGNOSIS — Z8759 Personal history of other complications of pregnancy, childbirth and the puerperium: Secondary | ICD-10-CM

## 2021-05-06 NOTE — Progress Notes (Signed)
Fetal echo on Monday   I connected with  Gwenlyn Saran on 05/06/21 at 10:30 AM EDT by telephone and verified that I am speaking with the correct person using two identifiers.   I discussed the limitations, risks, security and privacy concerns of performing an evaluation and management service by telephone and the availability of in person appointments. I also discussed with the patient that there may be a patient responsible charge related to this service. The patient expressed understanding and agreed to proceed.  Crosby Oyster, RN 05/06/2021  11:17 AM

## 2021-05-06 NOTE — Progress Notes (Signed)
I connected with Christie Ali 05/06/21 at 10:30 AM EDT by: MyChart video and verified that I am speaking with the correct person using two identifiers.  Patient is located at home and provider is located at [facility].     The purpose of this virtual visit is to provide medical care while limiting exposure to the novel coronavirus. I discussed the limitations, risks, security and privacy concerns of performing an evaluation and management service by MyChart video and the availability of in person appointments. I also discussed with the patient that there may be a patient responsible charge related to this service. By engaging in this virtual visit, you consent to the provision of healthcare.  Additionally, you authorize for your insurance to be billed for the services provided during this visit.  The patient expressed understanding and agreed to proceed.  The following staff members participated in the virtual visit:  Crosby Oyster    PRENATAL VISIT NOTE  Subjective:  Christie Ali is a 35 y.o. [id number] at [redacted]w[redacted]d  For a Mychart Visit for ongoing prenatal care.  She is currently monitored for the following issues for this low-risk pregnancy and has Asthma; HSV-2 infection; History of gestational hypertension; and Supervision of other normal pregnancy, antepartum on their problem list.  Patient reports vaginal irritation and white discharge.  Contractions: Not present. Vag. Bleeding: None.  Movement: Present. Denies leaking of fluid.   Monday fetal echo  The following portions of the patient's history were reviewed and updated as appropriate: allergies, current medications, past family history, past medical history, past social history, past surgical history and problem list.   Objective:  There were no vitals filed for this visit. Self-Obtained  Fetal Status:     Movement: Present     Assessment and Plan:  Pregnancy: [id number] at [redacted]w[redacted]d   1. Supervision of other normal pregnancy,  antepartum Having vaginal irritation and chunky /dc-- recommended repeat dose of diflucan and monitor sx. Come in for swab if not improved.   2. History of gestational hypertension  3. HSV-2 infection PPX at 36 wks     Preterm labor symptoms and general obstetric precautions including but not limited to vaginal bleeding, contractions, leaking of fluid and fetal movement were reviewed in detail with the patient.  Return in about 4 weeks (around 06/03/2021) for Routine prenatal care, 28 wk labs.  Future Appointments  Date Time Provider [place]  06/03/2021  8:30 AM Caren Macadam, MD [facility]  06/15/2021  7:45 AM [facility] NURSE [facility]  06/15/2021  8:00 AM [facility] US1 [facility] [place]     Time spent on virtual visit: 20 minutes  Caren Macadam, MD

## 2021-05-08 ENCOUNTER — Encounter: Payer: Self-pay | Admitting: Pediatric Cardiology

## 2021-05-11 DIAGNOSIS — O359XX0 Maternal care for (suspected) fetal abnormality and damage, unspecified, not applicable or unspecified: Secondary | ICD-10-CM | POA: Diagnosis not present

## 2021-05-27 ENCOUNTER — Telehealth: Payer: Self-pay

## 2021-05-27 NOTE — Telephone Encounter (Signed)
Mar/FETAL ECHO COMPLETED 05/11/2021.

## 2021-06-03 ENCOUNTER — Other Ambulatory Visit: Payer: Self-pay

## 2021-06-03 ENCOUNTER — Ambulatory Visit (INDEPENDENT_AMBULATORY_CARE_PROVIDER_SITE_OTHER): Payer: Medicaid Other | Admitting: Family Medicine

## 2021-06-03 ENCOUNTER — Encounter: Payer: Self-pay | Admitting: Family Medicine

## 2021-06-03 VITALS — BP 113/69 | HR 82 | Wt 190.8 lb

## 2021-06-03 DIAGNOSIS — L2082 Flexural eczema: Secondary | ICD-10-CM

## 2021-06-03 DIAGNOSIS — Z8759 Personal history of other complications of pregnancy, childbirth and the puerperium: Secondary | ICD-10-CM

## 2021-06-03 DIAGNOSIS — B009 Herpesviral infection, unspecified: Secondary | ICD-10-CM

## 2021-06-03 DIAGNOSIS — Z23 Encounter for immunization: Secondary | ICD-10-CM

## 2021-06-03 DIAGNOSIS — Z3480 Encounter for supervision of other normal pregnancy, unspecified trimester: Secondary | ICD-10-CM

## 2021-06-03 MED ORDER — TRIAMCINOLONE ACETONIDE 0.1 % EX CREA: 1.0000 "application " | TOPICAL_CREAM | Freq: Two times a day (BID) | CUTANEOUS | 0 refills | Status: DC

## 2021-06-03 MED ORDER — TRIAMCINOLONE ACETONIDE 0.5 % EX OINT: 1.0000 "application " | TOPICAL_OINTMENT | Freq: Two times a day (BID) | CUTANEOUS | 1 refills | Status: AC

## 2021-06-03 NOTE — Progress Notes (Signed)
   PRENATAL VISIT NOTE  Subjective:  Christie Ali is a 35 y.o. [phone] at [redacted]w[redacted]d being seen today for ongoing prenatal care.  She is currently monitored for the following issues for this low-risk pregnancy and has Asthma; HSV-2 infection; History of gestational hypertension; and Supervision of other normal pregnancy, antepartum on their problem list.  Patient reports  rash/.itching on right arm .  Contractions: Not present. Vag. Bleeding: None.  Movement: Present. Denies leaking of fluid.   The following portions of the patient's history were reviewed and updated as appropriate: allergies, current medications, past family history, past medical history, past social history, past surgical history and problem list.   Objective:   Vitals:   06/03/21 0831  BP: 113/69  Pulse: 82  Weight: 190 lb 12.8 oz (86.5 kg)    Fetal Status: Fetal Heart Rate (bpm): 140   Movement: Present     General:  Alert, oriented and cooperative. Patient is in no acute distress.  Skin: Skin is warm and dry. No rash noted.   Cardiovascular: Normal heart rate noted  Respiratory: Normal respiratory effort, no problems with respiration noted  Abdomen: Soft, gravid, appropriate for gestational age.  Pain/Pressure: Absent     Pelvic: Cervical exam deferred        Extremities: Normal range of motion.     Mental Status: Normal mood and affect. Normal behavior. Normal judgment and thought content.   Assessment and Plan:  Pregnancy: [id number] at [redacted]w[redacted]d  1. Supervision of other normal pregnancy, antepartum Up to date - Glucose Tolerance, 2 Hours w/1 Hour - CBC - RPR - HIV Antibody (routine testing w rflx) - Tdap vaccine greater than or equal to 7yo IM  2. History of gestational hypertension BP WNL today  3. HSV-2 infection PPX at 36 weeks  4. Flexural eczema -kenalog ointment rx sent   Preterm labor symptoms and general obstetric precautions including but not limited to vaginal bleeding, contractions,  leaking of fluid and fetal movement were reviewed in detail with the patient. Please refer to After Visit Summary for other counseling recommendations.   Return in about 3 weeks (around 06/24/2021) for Routine prenatal care, MD or APP.  Future Appointments  Date Time Provider [place]  06/15/2021  7:45 AM [facility] NURSE [facility]  06/15/2021  8:00 AM [facility] US1 [facility]    Caren Macadam, MD

## 2021-06-04 LAB — CBC
Hematocrit: 36.5 % (ref 34.0–46.6)
Hemoglobin: 12.3 g/dL (ref 11.1–15.9)
MCH: 32.4 pg (ref 26.6–33.0)
MCHC: 33.7 g/dL (ref 31.5–35.7)
MCV: 96 fL (ref 79–97)
Platelets: 192 10*3/uL (ref 150–450)
RBC: 3.8 x10E6/uL (ref 3.77–5.28)
RDW: 12.1 % (ref 11.7–15.4)
WBC: 13.3 10*3/uL — ABNORMAL HIGH (ref 3.4–10.8)

## 2021-06-04 LAB — GLUCOSE TOLERANCE, 2 HOURS W/ 1HR
Glucose, 1 hour: 128 mg/dL (ref 65–179)
Glucose, 2 hour: 76 mg/dL (ref 65–152)
Glucose, Fasting: 77 mg/dL (ref 65–91)

## 2021-06-04 LAB — HIV ANTIBODY (ROUTINE TESTING W REFLEX): HIV Screen 4th Generation wRfx: NONREACTIVE

## 2021-06-04 LAB — RPR: RPR Ser Ql: NONREACTIVE

## 2021-06-15 ENCOUNTER — Other Ambulatory Visit: Payer: Self-pay | Admitting: *Deleted

## 2021-06-15 ENCOUNTER — Encounter: Payer: Self-pay | Admitting: *Deleted

## 2021-06-15 ENCOUNTER — Other Ambulatory Visit: Payer: Self-pay

## 2021-06-15 ENCOUNTER — Ambulatory Visit: Payer: Medicaid Other | Admitting: *Deleted

## 2021-06-15 ENCOUNTER — Ambulatory Visit: Payer: Medicaid Other | Attending: Obstetrics and Gynecology

## 2021-06-15 ENCOUNTER — Other Ambulatory Visit: Payer: Self-pay | Admitting: Obstetrics and Gynecology

## 2021-06-15 VITALS — BP 126/73 | HR 71

## 2021-06-15 DIAGNOSIS — O09893 Supervision of other high risk pregnancies, third trimester: Secondary | ICD-10-CM

## 2021-06-15 DIAGNOSIS — O09299 Supervision of pregnancy with other poor reproductive or obstetric history, unspecified trimester: Secondary | ICD-10-CM | POA: Diagnosis not present

## 2021-06-15 DIAGNOSIS — O09293 Supervision of pregnancy with other poor reproductive or obstetric history, third trimester: Secondary | ICD-10-CM

## 2021-06-15 DIAGNOSIS — Q261 Persistent left superior vena cava: Secondary | ICD-10-CM | POA: Insufficient documentation

## 2021-06-15 DIAGNOSIS — Z8759 Personal history of other complications of pregnancy, childbirth and the puerperium: Secondary | ICD-10-CM | POA: Insufficient documentation

## 2021-06-15 DIAGNOSIS — O133 Gestational [pregnancy-induced] hypertension without significant proteinuria, third trimester: Secondary | ICD-10-CM

## 2021-06-24 ENCOUNTER — Other Ambulatory Visit: Payer: Self-pay

## 2021-06-24 ENCOUNTER — Telehealth (INDEPENDENT_AMBULATORY_CARE_PROVIDER_SITE_OTHER): Payer: Medicaid Other | Admitting: Advanced Practice Midwife

## 2021-06-24 DIAGNOSIS — Z3A30 30 weeks gestation of pregnancy: Secondary | ICD-10-CM

## 2021-06-24 DIAGNOSIS — Z3480 Encounter for supervision of other normal pregnancy, unspecified trimester: Secondary | ICD-10-CM

## 2021-06-24 DIAGNOSIS — O09293 Supervision of pregnancy with other poor reproductive or obstetric history, third trimester: Secondary | ICD-10-CM

## 2021-06-24 DIAGNOSIS — Z8759 Personal history of other complications of pregnancy, childbirth and the puerperium: Secondary | ICD-10-CM

## 2021-06-24 NOTE — Progress Notes (Signed)
No complaints Unable to give BP today, she is not at home. BP yesterday 123 69. She will take again when she returns home.

## 2021-06-24 NOTE — Progress Notes (Signed)
OBSTETRICS PRENATAL VIRTUAL VISIT ENCOUNTER NOTE  Provider location: [facility]   Patient location: Home  I connected with Christie Ali on 06/24/21 at  8:30 AM EDT by MyChart Video Encounter and verified that I am speaking with the correct person using two identifiers. I discussed the limitations, risks, security and privacy concerns of performing an evaluation and management service virtually and the availability of in person appointments. I also discussed with the patient that there may be a patient responsible charge related to this service. The patient expressed understanding and agreed to proceed. Subjective:  Christie Ali is a 35 y.o. [phone] at [redacted]w[redacted]d being seen today for ongoing prenatal care.  She is currently monitored for the following issues for this low-risk pregnancy and has Asthma; HSV-2 infection; History of gestational hypertension; and Supervision of other normal pregnancy, antepartum on their problem list.  Patient reports no complaints.  Contractions: Not present. Vag. Bleeding: None.  Movement: Present. Denies any leaking of fluid.   The following portions of the patient's history were reviewed and updated as appropriate: allergies, current medications, past family history, past medical history, past social history, past surgical history and problem list.   Objective:  There were no vitals filed for this visit.  Fetal Status:     Movement: Present     General:  Alert, oriented and cooperative. Patient is in no acute distress.  Respiratory: Normal respiratory effort, no problems with respiration noted  Mental Status: Normal mood and affect. Normal behavior. Normal judgment and thought content.  Rest of physical exam deferred due to type of encounter  Imaging: [place] MFM OB FOLLOW UP  Result Date: 06/15/2021 ----------------------------------------------------------------------  OBSTETRICS REPORT                       (Signed  Final 06/15/2021 08:59 am) ---------------------------------------------------------------------- Patient Info  ID #:       [id number]                          D.O.B.:  06/11/1986 (35 yrs)  Name:       Christie Ali                Visit Date: 06/15/2021 07:53 am ---------------------------------------------------------------------- Performed By  Attending:        Tama High MD        Ref. Address:     [place]  Performed By:     Vance Peper BS,      [facility]  Referred By:      Caren Macadam MD ---------------------------------------------------------------------- Orders  #  Description                           Code        Ordered By  1  [place] MFM OB FOLLOW UP                   77824.23    Tama High ----------------------------------------------------------------------  #  Order #                     Accession #                Episode #  1  [id number]                   [id number]                 [id number] ---------------------------------------------------------------------- Indications  Poor obstetric history: Previous               O09.299  preeclampsia / eclampsia/gestational HTN  (ASA)  Asthma                                         O99.89 j45.909  History of fetal abnormality in previous       O09.299  pregnancy, currently pregnant (Polydactyl)  Short interval between pregancies, 2nd         O09.892  trimester  [redacted] weeks gestation of pregnancy                Z3A.29 ---------------------------------------------------------------------- Fetal Evaluation  Num Of Fetuses:         1  Fetal Heart Rate(bpm):  135  Cardiac Activity:       Observed  Presentation:           Cephalic   Placenta:               Posterior  P. Cord Insertion:      Visualized, central  Amniotic Fluid  AFI FV:      Within normal limits  AFI Sum(cm)     %Tile       Largest Pocket(cm)  19.85           78          5.21  RUQ(cm)       RLQ(cm)       LUQ(cm)        LLQ(cm)  5.21          5.12          4.87           4.65 ---------------------------------------------------------------------- Biometry  BPD:      78.9  mm     G. Age:  31w 5d         96  %  CI:        80.11   %    70 - 86                                                          FL/HC:      19.6   %    19.6 - 20.8  HC:      278.5  mm     G. Age:  30w 3d         80  %    HC/AC:      1.07        0.99 - 1.21  AC:      261.5  mm     G. Age:  30w 2d         77  %    FL/BPD:     69.2   %    71 - 87  FL:       54.6  mm     G. Age:  28w 6d         27  %    FL/AC:      20.9   %    20 - 24  LV:        3.8  mm  Est. FW:    1484  gm      3 lb 4 oz     67  % ---------------------------------------------------------------------- OB History  Gravidity:    4  Living:       2 ---------------------------------------------------------------------- Gestational Age  U/S Today:     30w 2d                                        EDD:   08/22/21  Best:          29w 1d     Det. By:  Loman Chroman         EDD:   08/30/21                                      (01/27/21) ---------------------------------------------------------------------- Anatomy  Cranium:               Appears normal         LVOT:                   Previously seen  Cavum:                 Appears normal         Aortic Arch:            Previously seen  Ventricles:            Appears normal         Ductal Arch:            Previously seen  Choroid Plexus:        Previously seen        Diaphragm:              Appears normal  Cerebellum:            Previously  seen        Stomach:                Appears normal, left                                                                        sided  Posterior Fossa:       Previously  seen        Abdomen:                Appears normal  Nuchal Fold:           Previously seen        Abdominal Wall:         Previously seen  Face:                  Orbits and profile     Cord Vessels:           Appears normal (3                         previously seen                                vessel cord)  Lips:                  Previously seen        Kidneys:                Appear normal  Palate:                Previously seen        Bladder:                Appears normal  Thoracic:              Appears normal         Spine:                  Previously seen  Heart:                 Appears normal         Upper Extremities:      Previously seen                         (4CH, axis, and                         situs)  RVOT:                  Appears normal         Lower Extremities:      Previously seen  Other:  Heels and open hands/5th digits previously visualized.  VC,  Nasal          bone and lenses previously visualized.Fetus previously appears to be          a female. ---------------------------------------------------------------------- Targeted Anatomy  Thorax  3 Vessel View:         Appears normal  3 V Trachea View:       Appears normal ---------------------------------------------------------------------- Cervix Uterus Adnexa  Cervix  Not visualized (advanced GA >24wks) ---------------------------------------------------------------------- Impression  Patient returned for fetal growth assessment.  She does not  have gestational diabetes.  Her second delivery was  complicated by shoulder dystocia but with no neurological  injuries.  Patient had fetal echocardiography on 05/11/2021 and it was  performed because of suspected left superior vena cava. It  was reported as normal.  Fetal growth is appropriate for gestational age.  Amniotic fluid  is normal and good fetal activity seen.  Cardiac anatomy  including four-chamber view and three-vessel view appears  normal.  I reassured the patient of the findings.  ---------------------------------------------------------------------- Recommendations  -An appointment was made for her to return in 8 weeks for  fetal growth assessment (history of shoulder dystocia). ----------------------------------------------------------------------                  Tama High, MD Electronically Signed Final Report   06/15/2021 08:59 am ----------------------------------------------------------------------   Assessment and Plan:  Pregnancy: [id number] at [redacted]w[redacted]d 1. Supervision of other normal pregnancy, antepartum - Routine care - Patient with children at emergency Peds visit during original appointment. Able to finish appointment during additional call a few hours later - Previously desired waterbirth due to believing she would give birth at home, not on L&D inpatient.  - Patient no longer desires waterbirth  2. History of gestational hypertension - Home BPs normal yesterday. Patient to send updated BP once she is home from [facility] appointment  3. [redacted] weeks gestation of pregnancy - Begin daily kick counts  Preterm labor symptoms and general obstetric precautions including but not limited to vaginal bleeding, contractions, leaking of fluid and fetal movement were reviewed in detail with the patient. I discussed the assessment and treatment plan with the patient. The patient was provided an opportunity to ask questions and all were answered. The patient agreed with the plan and demonstrated an understanding of the instructions. The patient was advised to call back or seek an in-person office evaluation/go to [facility] at [facility] for any urgent or concerning symptoms. Please refer to After Visit Summary for other counseling recommendations.   I provided 15 minutes of face-to-face time during this encounter.  No follow-ups on file.  Future Appointments  Date Time Provider [place]  07/07/2021  9:30 AM Constant, Vickii Chafe, MD [facility]  08/10/2021   7:30 AM [facility] NURSE [facility]  08/10/2021  7:45 AM [facility] US4 [facility] [place], [place] for [employer], [place]

## 2021-07-07 ENCOUNTER — Other Ambulatory Visit: Payer: Self-pay

## 2021-07-07 ENCOUNTER — Encounter: Payer: Self-pay | Admitting: Obstetrics and Gynecology

## 2021-07-07 ENCOUNTER — Ambulatory Visit (INDEPENDENT_AMBULATORY_CARE_PROVIDER_SITE_OTHER): Payer: Medicaid Other | Admitting: Obstetrics and Gynecology

## 2021-07-07 VITALS — BP 128/78 | HR 105 | Wt 191.0 lb

## 2021-07-07 DIAGNOSIS — Z8759 Personal history of other complications of pregnancy, childbirth and the puerperium: Secondary | ICD-10-CM

## 2021-07-07 DIAGNOSIS — B009 Herpesviral infection, unspecified: Secondary | ICD-10-CM

## 2021-07-07 DIAGNOSIS — Z3480 Encounter for supervision of other normal pregnancy, unspecified trimester: Secondary | ICD-10-CM

## 2021-07-07 NOTE — Progress Notes (Signed)
   PRENATAL VISIT NOTE  Subjective:  Christie Ali is a 35 y.o. [phone] at [redacted]w[redacted]d being seen today for ongoing prenatal care.  She is currently monitored for the following issues for this low-risk pregnancy and has Asthma; HSV-2 infection; History of gestational hypertension; and Supervision of other normal pregnancy, antepartum on their problem list.  Patient reports no complaints.  Contractions: Not present. Vag. Bleeding: None.  Movement: Present. Denies leaking of fluid.   The following portions of the patient's history were reviewed and updated as appropriate: allergies, current medications, past family history, past medical history, past social history, past surgical history and problem list.   Objective:   Vitals:   07/07/21 0926  BP: 128/78  Pulse: (!) 105  Weight: 191 lb (86.6 kg)    Fetal Status: Fetal Heart Rate (bpm): 155 Fundal Height: 32 cm Movement: Present     General:  Alert, oriented and cooperative. Patient is in no acute distress.  Skin: Skin is warm and dry. No rash noted.   Cardiovascular: Normal heart rate noted  Respiratory: Normal respiratory effort, no problems with respiration noted  Abdomen: Soft, gravid, appropriate for gestational age.  Pain/Pressure: Present     Pelvic: Cervical exam deferred        Extremities: Normal range of motion.  Edema: Trace  Mental Status: Normal mood and affect. Normal behavior. Normal judgment and thought content.   Assessment and Plan:  Pregnancy: [id number] at [redacted]w[redacted]d 1. Supervision of other normal pregnancy, antepartum Patient is doing well without complaints  2. History of gestational hypertension Normotensive Preeclampsia symptoms reviewed  3. HSV-2 infection Prophylaxis at 36 weeks  Preterm labor symptoms and general obstetric precautions including but not limited to vaginal bleeding, contractions, leaking of fluid and fetal movement were reviewed in detail with the patient. Please refer to After Visit Summary  for other counseling recommendations.   Return in about 2 weeks (around 07/21/2021) for in person, ROB, Low risk.  Future Appointments  Date Time Provider [place]  08/10/2021  7:30 AM [facility] NURSE [facility]  08/10/2021  7:45 AM [facility] US4 [facility] [place]    Mora Bellman, MD

## 2021-07-22 ENCOUNTER — Other Ambulatory Visit: Payer: Self-pay

## 2021-07-22 ENCOUNTER — Ambulatory Visit (INDEPENDENT_AMBULATORY_CARE_PROVIDER_SITE_OTHER): Payer: Medicaid Other | Admitting: Family Medicine

## 2021-07-22 VITALS — BP 129/84 | HR 83 | Wt 191.8 lb

## 2021-07-22 DIAGNOSIS — B009 Herpesviral infection, unspecified: Secondary | ICD-10-CM

## 2021-07-22 DIAGNOSIS — Z8759 Personal history of other complications of pregnancy, childbirth and the puerperium: Secondary | ICD-10-CM

## 2021-07-22 DIAGNOSIS — Z3480 Encounter for supervision of other normal pregnancy, unspecified trimester: Secondary | ICD-10-CM

## 2021-07-22 NOTE — Patient Instructions (Signed)
[email or web address]">  Third Trimester of Pregnancy  The third trimester of pregnancy is from week 28 through week 40. This is months 7 through 9. The third trimester is a time when the unborn baby (fetus) is growing rapidly. At the end of the ninth month, the fetus is about 20inches long and weighs 6-10 pounds. Body changes during your third trimester During the third trimester, your body will continue to go through many changes.The changes vary and generally return to normal after your baby is born. Physical changes Your weight will continue to increase. You can expect to gain 25-35 pounds (11-16 kg) by the end of the pregnancy if you begin pregnancy at a normal weight. If you are underweight, you can expect to gain 28-40 lb (about 13-18 kg), and if you are overweight, you can expect to gain 15-25 lb (about 7-11 kg). You may begin to get stretch marks on your hips, abdomen, and breasts. Your breasts will continue to grow and may hurt. A yellow fluid (colostrum) may leak from your breasts. This is the first milk you are producing for your baby. You may have changes in your hair. These can include thickening of your hair, rapid growth, and changes in texture. Some people also have hair loss during or after pregnancy, or hair that feels dry or thin. Your belly button may stick out. You may notice more swelling in your hands, face, or ankles. Health changes You may have heartburn. You may have constipation. You may develop hemorrhoids. You may develop swollen, bulging veins (varicose veins) in your legs. You may have increased body aches in the pelvis, back, or thighs. This is due to weight gain and increased hormones that are relaxing your joints. You may have increased tingling or numbness in your hands, arms, and legs. The skin on your abdomen may also feel numb. You may feel short of breath because of your expanding uterus. Other  changes You may urinate more often because the fetus is moving lower into your pelvis and pressing on your bladder. You may have more problems sleeping. This may be caused by the size of your abdomen, an increased need to urinate, and an increase in your body's metabolism. You may notice the fetus "dropping," or moving lower in your abdomen (lightening). You may have increased vaginal discharge. You may notice that you have pain around your pelvic bone as your uterus distends. Follow these instructions at home: Medicines Follow your health care provider's instructions regarding medicine use. Specific medicines may be either safe or unsafe to take during pregnancy. Do not take any medicines unless approved by your health care provider. Take a prenatal vitamin that contains at least 600 micrograms (mcg) of folic acid. Eating and drinking Eat a healthy diet that includes fresh fruits and vegetables, whole grains, good sources of protein such as meat, eggs, or tofu, and low-fat dairy products. Avoid raw meat and unpasteurized juice, milk, and cheese. These carry germs that can harm you and your baby. Eat 4 or 5 small meals rather than 3 large meals a day. You may need to take these actions to prevent or treat constipation: Drink enough fluid to keep your urine pale yellow. Eat foods that are high in fiber, such as beans, whole grains, and fresh fruits and vegetables. Limit foods that are high in fat and processed sugars, such as fried or sweet foods. Activity Exercise only as directed by your health care provider. Most people can continue their usual exercise routine during pregnancy. Try  to exercise for 30 minutes at least 5 days a week. Stop exercising if you experience contractions in the uterus. Stop exercising if you develop pain or cramping in the lower abdomen or lower back. Avoid heavy lifting. Do not exercise if it is very hot or humid or if you are at a high altitude. If you choose to,  you may continue to have sex unless your health care provider tells you not to. Relieving pain and discomfort Take frequent breaks and rest with your legs raised (elevated) if you have leg cramps or low back pain. Take warm sitz baths to soothe any pain or discomfort caused by hemorrhoids. Use hemorrhoid cream if your health care provider approves. Wear a supportive bra to prevent discomfort from breast tenderness. If you develop varicose veins: Wear support hose as told by your health care provider. Elevate your feet for 15 minutes, 3-4 times a day. Limit salt in your diet. Safety Talk to your health care provider before traveling far distances. Do not use hot tubs, steam rooms, or saunas. Wear your seat belt at all times when driving or riding in a car. Talk with your health care provider if someone is verbally or physically abusive to you. Preparing for birth To prepare for the arrival of your baby: Take prenatal classes to understand, practice, and ask questions about labor and delivery. Visit the hospital and tour the maternity area. Purchase a rear-facing car seat and make sure you know how to install it in your car. Prepare the baby's room or sleeping area. Make sure to remove all pillows and stuffed animals from the baby's crib to prevent suffocation. General instructions Avoid cat litter boxes and soil used by cats. These carry germs that can cause birth defects in the baby. If you have a cat, ask someone to clean the litter box for you. Do not douche or use tampons. Do not use scented sanitary pads. Do not use any products that contain nicotine or tobacco, such as cigarettes, e-cigarettes, and chewing tobacco. If you need help quitting, ask your health care provider. Do not use any herbal remedies, illegal drugs, or medicines that were not prescribed to you. Chemicals in these products can harm your baby. Do not drink alcohol. You will have more frequent prenatal exams during the  third trimester. During a routine prenatal visit, your health care provider will do a physical exam, perform tests, and discuss your overall health. Keep all follow-up visits. This is important. Where to find more information American Pregnancy Association: americanpregnancy.[place] and Gynecologists: PoolDevices.com.pt Office on [employer]: KeywordPortfolios.com.br Contact a health care provider if you have: A fever. Mild pelvic cramps, pelvic pressure, or nagging pain in your abdominal area or lower back. Vomiting or diarrhea. Bad-smelling vaginal discharge or foul-smelling urine. Pain when you urinate. A headache that does not go away when you take medicine. Visual changes or see spots in front of your eyes. Get help right away if: Your water breaks. You have regular contractions less than 5 minutes apart. You have spotting or bleeding from your vagina. You have severe abdominal pain. You have difficulty breathing. You have chest pain. You have fainting spells. You have not felt your baby move for the time period told by your health care provider. You have new or increased pain, swelling, or redness in an arm or leg. Summary The third trimester of pregnancy is from week 28 through week 40 (months 7 through 9). You may have more problems  sleeping. This can be caused by the size of your abdomen, an increased need to urinate, and an increase in your body's metabolism. You will have more frequent prenatal exams during the third trimester. Keep all follow-up visits. This is important. This information is not intended to replace advice given to you by your health care provider. Make sure you discuss any questions you have with your healthcare provider. Document Revised: 05/14/2020 Document Reviewed: 03/20/2020 Elsevier Patient Education  2022 [employer].

## 2021-07-22 NOTE — Progress Notes (Signed)
   PRENATAL VISIT NOTE  Subjective:  Christie Ali is a 35 y.o. [phone] at 14w3dbeing seen today for ongoing prenatal care.  She is currently monitored for the following issues for this low-risk pregnancy and has Asthma; HSV-2 infection; History of gestational hypertension; and Supervision of other normal pregnancy, antepartum on their problem list.  Patient reports no complaints.  Contractions: Not present. Vag. Bleeding: None.  Movement: Present. Denies leaking of fluid.   The following portions of the patient's history were reviewed and updated as appropriate: allergies, current medications, past family history, past medical history, past social history, past surgical history and problem list.   Objective:   Vitals:   07/22/21 0947  BP: 129/84  Pulse: 83  Weight: 191 lb 12.8 oz (87 kg)    Fetal Status: Fetal Heart Rate (bpm): 128 Fundal Height: 34 cm Movement: Present     General:  Alert, oriented and cooperative. Patient is in no acute distress.  Skin: Skin is warm and dry. No rash noted.   Cardiovascular: Normal heart rate noted  Respiratory: Normal respiratory effort, no problems with respiration noted  Abdomen: Soft, gravid, appropriate for gestational age.  Pain/Pressure: Present     Pelvic: Cervical exam deferred        Extremities: Normal range of motion.     Mental Status: Normal mood and affect. Normal behavior. Normal judgment and thought content.   Assessment and Plan:  Pregnancy: [id number] 371w3d. History of gestational hypertension BP is normal today  2. HSV-2 infection On Valtrex  3. Supervision of other normal pregnancy, antepartum Continue routine prenatal care.   Preterm labor symptoms and general obstetric precautions including but not limited to vaginal bleeding, contractions, leaking of fluid and fetal movement were reviewed in detail with the patient. Please refer to After Visit Summary for other counseling recommendations.   Return in 2  weeks (on 08/05/2021).  Future Appointments  Date Time Provider DeSan [place]8/16/2022  8:15 AM PiAletha HalimMD [facility]  08/10/2021  7:30 AM [facility]/22/2022  7:45 AM [facility] US[facility]8/24/2022  4:10 PM NeCaren MacadamMD [facility]  08/20/2021  4:10 PM PiAletha HalimMD [facility]  08/26/2021  8:35 AM NeCaren MacadamMD [facility]    TaDonnamae JudeMD

## 2021-08-04 ENCOUNTER — Other Ambulatory Visit: Payer: Self-pay

## 2021-08-04 ENCOUNTER — Other Ambulatory Visit: Payer: Self-pay | Admitting: Obstetrics and Gynecology

## 2021-08-04 ENCOUNTER — Other Ambulatory Visit (HOSPITAL_COMMUNITY)
Admission: RE | Admit: 2021-08-04 | Discharge: 2021-08-04 | Disposition: A | Discharge status: Home / Self Care | Payer: Medicaid Other | Source: Ambulatory Visit | Attending: Obstetrics and Gynecology | Admitting: Obstetrics and Gynecology

## 2021-08-04 ENCOUNTER — Ambulatory Visit (INDEPENDENT_AMBULATORY_CARE_PROVIDER_SITE_OTHER): Payer: Medicaid Other | Admitting: Obstetrics and Gynecology

## 2021-08-04 VITALS — BP 120/74 | HR 89 | Wt 192.0 lb

## 2021-08-04 DIAGNOSIS — B009 Herpesviral infection, unspecified: Secondary | ICD-10-CM

## 2021-08-04 DIAGNOSIS — Z3480 Encounter for supervision of other normal pregnancy, unspecified trimester: Secondary | ICD-10-CM | POA: Insufficient documentation

## 2021-08-04 DIAGNOSIS — Z3A36 36 weeks gestation of pregnancy: Secondary | ICD-10-CM

## 2021-08-04 DIAGNOSIS — Z0373 Encounter for suspected fetal anomaly ruled out: Secondary | ICD-10-CM | POA: Insufficient documentation

## 2021-08-04 DIAGNOSIS — O09299 Supervision of pregnancy with other poor reproductive or obstetric history, unspecified trimester: Secondary | ICD-10-CM | POA: Insufficient documentation

## 2021-08-04 DIAGNOSIS — O0990 Supervision of high risk pregnancy, unspecified, unspecified trimester: Secondary | ICD-10-CM

## 2021-08-04 NOTE — Progress Notes (Signed)
     PRENATAL VISIT NOTE  Subjective:  Christie Ali is a 35 y.o. [phone] at 50w2dbeing seen today for ongoing prenatal care.  She is currently monitored for the following issues for this high-risk pregnancy and has Asthma; HSV-2 infection; History of gestational hypertension; Supervision of high risk pregnancy, antepartum; History of shoulder dystocia in prior pregnancy, currently pregnant; and Fetal anomaly suspected but not found on their problem list.  Patient reports no complaints.  Contractions: Not present. Vag. Bleeding: None.  Movement: Present. Denies leaking of fluid.   The following portions of the patient's history were reviewed and updated as appropriate: allergies, current medications, past family history, past medical history, past social history, past surgical history and problem list.   Objective:   Vitals:   08/04/21 0823  BP: 120/74  Pulse: 89  Weight: 192 lb (87.1 kg)    Fetal Status: Fetal Heart Rate (bpm): 147 Fundal Height: 36 cm Movement: Present  Presentation: Vertex  General:  Alert, oriented and cooperative. Patient is in no acute distress.  Skin: Skin is warm and dry. No rash noted.   Cardiovascular: Normal heart rate noted  Respiratory: Normal respiratory effort, no problems with respiration noted  Abdomen: Soft, gravid, appropriate for gestational age.  Pain/Pressure: Present     Pelvic: Cervical exam performed in the presence of a chaperone Dilation: 1 Effacement (%): 0 Station: Ballotable  Extremities: Normal range of motion.  Edema: None  Mental Status: Normal mood and affect. Normal behavior. Normal judgment and thought content.   Assessment and Plan:  Pregnancy: [id number] 321w2d. Supervision of other normal pregnancy, antepartum Inpatient nexplanon. Continue low dose asa.  - Strep Gp B NAA - GC[facility]Chlamydia probe amp ([place] [redacted] weeks gestation of pregnancy  3. HSV-2 infection EGBUS normal. Confirms on valtrex.  Precautions given  4. History of shoulder dystocia in prior pregnancy, currently pregnant Patient states 2020 son is healthy and doing well with no deficits. She has another boy and has growth on 8/22. I told her will talk to her more next visit but cause could've been due to tight nuchal but if this baby is significantly larger than may need to talk to her about delivery route.  08/2019: tight nuchal, SD, mcroberts and posterior arm, 3524gm. boy 06/2018: 3470gm no issues with delivery. girl  5. Supervision of high risk pregnancy, antepartum  6. Fetal anomaly suspected but not found S/p normal fetal echo.   Preterm labor symptoms and general obstetric precautions including but not limited to vaginal bleeding, contractions, leaking of fluid and fetal movement were reviewed in detail with the patient. Please refer to After Visit Summary for other counseling recommendations.   Return in about 1 week (around 08/11/2021) for in person, low risk ob, md or app.  Future Appointments  Date Time Provider DeFin[place]8/22/2022  7:30 [facility]8/22/2022  7:45 AM [facility]8/24/2022  4:10 PM NeCaren MacadamMD [facility]  08/20/2021  4:10 PM PiAletha HalimMD [facility]  08/26/2021  8:35 AM NeErnestina PatchesKiJuanita CraverMD [facility]    ChAletha HalimMD

## 2021-08-05 LAB — GC/CHLAMYDIA PROBE AMP (~~LOC~~) NOT AT ARMC
Chlamydia: NEGATIVE
Comment: NEGATIVE
Comment: NORMAL
Neisseria Gonorrhea: NEGATIVE

## 2021-08-06 LAB — STREP GP B NAA: Strep Gp B NAA: NEGATIVE

## 2021-08-10 ENCOUNTER — Other Ambulatory Visit: Payer: Self-pay

## 2021-08-10 ENCOUNTER — Ambulatory Visit: Payer: Medicaid Other | Attending: Obstetrics and Gynecology

## 2021-08-10 ENCOUNTER — Encounter: Payer: Self-pay | Admitting: *Deleted

## 2021-08-10 ENCOUNTER — Ambulatory Visit: Payer: Medicaid Other | Admitting: *Deleted

## 2021-08-10 VITALS — BP 126/77 | HR 72

## 2021-08-10 DIAGNOSIS — O133 Gestational [pregnancy-induced] hypertension without significant proteinuria, third trimester: Secondary | ICD-10-CM | POA: Diagnosis not present

## 2021-08-10 DIAGNOSIS — O99513 Diseases of the respiratory system complicating pregnancy, third trimester: Secondary | ICD-10-CM

## 2021-08-10 DIAGNOSIS — J45909 Unspecified asthma, uncomplicated: Secondary | ICD-10-CM

## 2021-08-10 DIAGNOSIS — O09293 Supervision of pregnancy with other poor reproductive or obstetric history, third trimester: Secondary | ICD-10-CM

## 2021-08-10 DIAGNOSIS — O09899 Supervision of other high risk pregnancies, unspecified trimester: Secondary | ICD-10-CM | POA: Insufficient documentation

## 2021-08-10 DIAGNOSIS — Z8759 Personal history of other complications of pregnancy, childbirth and the puerperium: Secondary | ICD-10-CM

## 2021-08-10 DIAGNOSIS — Z3A37 37 weeks gestation of pregnancy: Secondary | ICD-10-CM | POA: Diagnosis not present

## 2021-08-12 ENCOUNTER — Ambulatory Visit (INDEPENDENT_AMBULATORY_CARE_PROVIDER_SITE_OTHER): Payer: Medicaid Other | Admitting: Family Medicine

## 2021-08-12 ENCOUNTER — Other Ambulatory Visit: Payer: Self-pay

## 2021-08-12 VITALS — BP 112/77 | HR 87 | Wt 196.0 lb

## 2021-08-12 DIAGNOSIS — O0990 Supervision of high risk pregnancy, unspecified, unspecified trimester: Secondary | ICD-10-CM

## 2021-08-12 DIAGNOSIS — Z8759 Personal history of other complications of pregnancy, childbirth and the puerperium: Secondary | ICD-10-CM

## 2021-08-12 DIAGNOSIS — O09299 Supervision of pregnancy with other poor reproductive or obstetric history, unspecified trimester: Secondary | ICD-10-CM

## 2021-08-12 DIAGNOSIS — B009 Herpesviral infection, unspecified: Secondary | ICD-10-CM

## 2021-08-12 NOTE — Progress Notes (Signed)
   PRENATAL VISIT NOTE  Subjective:  Christie Ali is a 35 y.o. [phone] at 48w3dbeing seen today for ongoing prenatal care.  She is currently monitored for the following issues for this high-risk pregnancy and has Asthma; HSV-2 infection; History of gestational hypertension; Supervision of high risk pregnancy, antepartum; History of shoulder dystocia in prior pregnancy, currently pregnant; and Fetal anomaly suspected but not found on their problem list.  Patient reports no complaints.  Contractions: Not present. Vag. Bleeding: None.  Movement: Present. Denies leaking of fluid.   The following portions of the patient's history were reviewed and updated as appropriate: allergies, current medications, past family history, past medical history, past social history, past surgical history and problem list.   Objective:   Vitals:   08/12/21 1606  BP: 112/77  Pulse: 87  Weight: 196 lb (88.9 kg)    Fetal Status: Fetal Heart Rate (bpm): 132 Fundal Height: 37 cm Movement: Present  Presentation: Vertex  General:  Alert, oriented and cooperative. Patient is in no acute distress.  Skin: Skin is warm and dry. No rash noted.   Cardiovascular: Normal heart rate noted  Respiratory: Normal respiratory effort, no problems with respiration noted  Abdomen: Soft, gravid, appropriate for gestational age.  Pain/Pressure: Absent     Pelvic: Cervical exam performed in the presence of a chaperone Dilation: 1 Effacement (%): 20 Station: -3  Extremities: Normal range of motion.  Edema: None  Mental Status: Normal mood and affect. Normal behavior. Normal judgment and thought content.   Assessment and Plan:  Pregnancy: [id number] 372w3d1. History of shoulder dystocia in prior pregnancy, currently pregnant EFW a 8-22 at 371w1d64g (61st%) See MFM note  2. Supervision of high risk pregnancy, antepartum Request IOL for 39 wks for 9/4 daytime -- elective for history of shoulder dystocia Discussed  contraception, reviewed postplacental IUD. Desires LNG IUD 3. HSV-2 infection Taking ppx  4. History of gestational hypertension BP is WNL  Term labor symptoms and general obstetric precautions including but not limited to vaginal bleeding, contractions, leaking of fluid and fetal movement were reviewed in detail with the patient. Please refer to After Visit Summary for other counseling recommendations.   Return in about 1 week (around 08/19/2021) for Routine prenatal care.  Future Appointments  Date Time Provider DepDodge/12/2020  4:10 PM PicAletha HalimD [facility]  08/23/2021  6:30 AM [facility] SCHArthur-[facility]DC None  08/26/2021  8:35 AM NewCaren MacadamD [facility]    KimCaren MacadamD

## 2021-08-13 ENCOUNTER — Other Ambulatory Visit: Payer: Self-pay | Admitting: *Deleted

## 2021-08-13 MED ORDER — BREAST PUMP MISC: 0 refills | Status: DC

## 2021-08-13 MED ORDER — BREAST PUMP MISC
0 refills | Status: DC
Start: 2021-08-13 — End: 2021-08-13

## 2021-08-18 ENCOUNTER — Other Ambulatory Visit: Payer: Self-pay | Admitting: Advanced Practice Midwife

## 2021-08-20 ENCOUNTER — Encounter: Payer: Self-pay | Admitting: Obstetrics and Gynecology

## 2021-08-20 ENCOUNTER — Encounter (HOSPITAL_COMMUNITY): Payer: Self-pay

## 2021-08-20 ENCOUNTER — Ambulatory Visit (INDEPENDENT_AMBULATORY_CARE_PROVIDER_SITE_OTHER): Payer: Medicaid Other | Admitting: Obstetrics and Gynecology

## 2021-08-20 ENCOUNTER — Encounter (HOSPITAL_COMMUNITY): Payer: Self-pay | Admitting: *Deleted

## 2021-08-20 ENCOUNTER — Other Ambulatory Visit: Payer: Self-pay

## 2021-08-20 ENCOUNTER — Telehealth (HOSPITAL_COMMUNITY): Payer: Self-pay | Admitting: *Deleted

## 2021-08-20 VITALS — BP 120/75 | HR 84 | Wt 194.0 lb

## 2021-08-20 DIAGNOSIS — Z3A38 38 weeks gestation of pregnancy: Secondary | ICD-10-CM

## 2021-08-20 NOTE — Telephone Encounter (Signed)
Preadmission screen  

## 2021-08-20 NOTE — Progress Notes (Addendum)
   PRENATAL VISIT NOTE  Subjective:  Christie Ali is a 35 y.o. [phone] at 51w4dbeing seen today for ongoing prenatal care.  She is currently monitored for the following issues for this low-risk pregnancy and has Asthma; HSV-2 infection; History of gestational hypertension; Supervision of high risk pregnancy, antepartum; History of shoulder dystocia in prior pregnancy, currently pregnant; Fetal anomaly suspected but not found; and Term pregnancy on their problem list.  Patient reports no complaints.  Contractions: Not present. Vag. Bleeding: None.  Movement: Present. Denies leaking of fluid.   The following portions of the patient's history were reviewed and updated as appropriate: allergies, current medications, past family history, past medical history, past social history, past surgical history and problem list.   Objective:   Vitals:   08/20/21 1603  BP: 120/75  Pulse: 84  Weight: 194 lb (88 kg)    Fetal Status: Fetal Heart Rate (bpm): 145 Fundal Height: 37 cm Movement: Present  Presentation: Vertex  General:  Alert, oriented and cooperative. Patient is in no acute distress.  Skin: Skin is warm and dry. No rash noted.   Cardiovascular: Normal heart rate noted  Respiratory: Normal respiratory effort, no problems with respiration noted  Abdomen: Soft, gravid, appropriate for gestational age.  Pain/Pressure: Absent     Pelvic: Cervical exam performed in the presence of a chaperone Dilation: 1 Effacement (%): 50    Extremities: Normal range of motion.  Edema: None  Mental Status: Normal mood and affect. Normal behavior. Normal judgment and thought content.   Assessment and Plan:  Pregnancy: [id number] 329w4d. [redacted] weeks gestation of pregnancy No issues. Continue valtrex ppx. H/o SD. Previously d/w her and pt desires trial of labor. Pt for 39wk IOL. 8/22 growth 3164gm, 61%, 92% on ac.   Term labor symptoms and general obstetric precautions including but not limited to vaginal  bleeding, contractions, leaking of fluid and fetal movement were reviewed in detail with the patient. Please refer to After Visit Summary for other counseling recommendations.  RTC PP  No future appointments.  ChAletha HalimMD

## 2021-08-21 ENCOUNTER — Other Ambulatory Visit: Payer: Self-pay | Admitting: Family Medicine

## 2021-08-22 LAB — SARS CORONAVIRUS 2 (TAT 6-24 HRS): SARS Coronavirus 2: NEGATIVE

## 2021-08-23 ENCOUNTER — Inpatient Hospital Stay (HOSPITAL_COMMUNITY): Payer: Medicaid Other | Admitting: Anesthesiology

## 2021-08-23 ENCOUNTER — Encounter (HOSPITAL_COMMUNITY): Payer: Self-pay | Admitting: Family Medicine

## 2021-08-23 ENCOUNTER — Inpatient Hospital Stay (HOSPITAL_COMMUNITY)
Admission: AD | Admit: 2021-08-23 | Discharge: 2021-08-24 | DRG: 807 | Disposition: A | Discharge status: Home / Self Care | Payer: Medicaid Other | Attending: Obstetrics and Gynecology | Admitting: Obstetrics and Gynecology

## 2021-08-23 ENCOUNTER — Other Ambulatory Visit: Payer: Self-pay

## 2021-08-23 ENCOUNTER — Inpatient Hospital Stay (HOSPITAL_COMMUNITY): Payer: Medicaid Other

## 2021-08-23 DIAGNOSIS — O99334 Smoking (tobacco) complicating childbirth: Principal | ICD-10-CM | POA: Diagnosis present

## 2021-08-23 DIAGNOSIS — Z3A39 39 weeks gestation of pregnancy: Secondary | ICD-10-CM

## 2021-08-23 DIAGNOSIS — F17210 Nicotine dependence, cigarettes, uncomplicated: Secondary | ICD-10-CM | POA: Diagnosis present

## 2021-08-23 DIAGNOSIS — O26893 Other specified pregnancy related conditions, third trimester: Secondary | ICD-10-CM | POA: Diagnosis present

## 2021-08-23 DIAGNOSIS — Z30430 Encounter for insertion of intrauterine contraceptive device: Secondary | ICD-10-CM

## 2021-08-23 DIAGNOSIS — O164 Unspecified maternal hypertension, complicating childbirth: Secondary | ICD-10-CM | POA: Diagnosis not present

## 2021-08-23 DIAGNOSIS — Z3490 Encounter for supervision of normal pregnancy, unspecified, unspecified trimester: Secondary | ICD-10-CM | POA: Diagnosis not present

## 2021-08-23 DIAGNOSIS — Z30014 Encounter for initial prescription of intrauterine contraceptive device: Secondary | ICD-10-CM | POA: Diagnosis not present

## 2021-08-23 LAB — CBC
HCT: 36.2 % (ref 36.0–46.0)
Hemoglobin: 12.2 g/dL (ref 12.0–15.0)
MCH: 32.5 pg (ref 26.0–34.0)
MCHC: 33.7 g/dL (ref 30.0–36.0)
MCV: 96.5 fL (ref 80.0–100.0)
Platelets: 174 10*3/uL (ref 150–400)
RBC: 3.75 MIL/uL — ABNORMAL LOW (ref 3.87–5.11)
RDW: 13.1 % (ref 11.5–15.5)
WBC: 15 10*3/uL — ABNORMAL HIGH (ref 4.0–10.5)
nRBC: 0 % (ref 0.0–0.2)

## 2021-08-23 LAB — TYPE AND SCREEN
ABO/RH(D): B POS
Antibody Screen: NEGATIVE

## 2021-08-23 MED ORDER — ZOLPIDEM TARTRATE 5 MG PO TABS: 5.0000 mg | ORAL_TABLET | Freq: Every evening | ORAL | Status: DC | PRN

## 2021-08-23 MED ORDER — OXYCODONE-ACETAMINOPHEN 5-325 MG PO TABS: 2.0000 | ORAL_TABLET | ORAL | Status: DC | PRN

## 2021-08-23 MED ORDER — FENTANYL-BUPIVACAINE-NACL 0.5-0.125-0.9 MG/250ML-% EP SOLN
12.0000 mL/h | EPIDURAL | Status: DC | PRN
  Administered 2021-08-23: 12 mL/h via EPIDURAL
  Filled 2021-08-23: qty 250

## 2021-08-23 MED ORDER — TETANUS-DIPHTH-ACELL PERTUSSIS 5-2.5-18.5 LF-MCG/0.5 IM SUSY: 0.5000 mL | PREFILLED_SYRINGE | Freq: Once | INTRAMUSCULAR | Status: DC

## 2021-08-23 MED ORDER — ACETAMINOPHEN 325 MG PO TABS: 650.0000 mg | ORAL_TABLET | ORAL | Status: DC | PRN

## 2021-08-23 MED ORDER — TERBUTALINE SULFATE 1 MG/ML IJ SOLN: 0.2500 mg | Freq: Once | INTRAMUSCULAR | Status: DC | PRN

## 2021-08-23 MED ORDER — COCONUT OIL OIL
1.0000 "application " | TOPICAL_OIL | Status: DC | PRN
  Administered 2021-08-24: 1 via TOPICAL

## 2021-08-23 MED ORDER — OXYTOCIN-SODIUM CHLORIDE 30-0.9 UT/500ML-% IV SOLN
2.5000 [IU]/h | INTRAVENOUS | Status: DC
  Filled 2021-08-23: qty 500

## 2021-08-23 MED ORDER — ONDANSETRON HCL 4 MG/2ML IJ SOLN: 4.0000 mg | Freq: Four times a day (QID) | INTRAMUSCULAR | Status: DC | PRN

## 2021-08-23 MED ORDER — LEVONORGESTREL 20.1 MCG/DAY IU IUD
1.0000 | INTRAUTERINE_SYSTEM | Freq: Once | INTRAUTERINE | Status: AC
  Administered 2021-08-23: 1 via INTRAUTERINE
  Filled 2021-08-23: qty 1

## 2021-08-23 MED ORDER — PHENYLEPHRINE 40 MCG/ML (10ML) SYRINGE FOR IV PUSH (FOR BLOOD PRESSURE SUPPORT): 80.0000 ug | PREFILLED_SYRINGE | INTRAVENOUS | Status: DC | PRN

## 2021-08-23 MED ORDER — EPHEDRINE 5 MG/ML INJ: 10.0000 mg | INTRAVENOUS | Status: DC | PRN

## 2021-08-23 MED ORDER — LACTATED RINGERS IV SOLN: 500.0000 mL | INTRAVENOUS | Status: DC | PRN

## 2021-08-23 MED ORDER — IBUPROFEN 600 MG PO TABS
600.0000 mg | ORAL_TABLET | Freq: Four times a day (QID) | ORAL | Status: DC
  Administered 2021-08-23 – 2021-08-24 (×4): 600 mg via ORAL
  Filled 2021-08-23 (×4): qty 1

## 2021-08-23 MED ORDER — MISOPROSTOL 25 MCG QUARTER TABLET: 25.0000 ug | ORAL_TABLET | ORAL | Status: DC | PRN

## 2021-08-23 MED ORDER — MISOPROSTOL 50MCG HALF TABLET
50.0000 ug | ORAL_TABLET | ORAL | Status: DC | PRN
  Administered 2021-08-23: 50 ug via BUCCAL

## 2021-08-23 MED ORDER — SOD CITRATE-CITRIC ACID 500-334 MG/5ML PO SOLN: 30.0000 mL | ORAL | Status: DC | PRN

## 2021-08-23 MED ORDER — LIDOCAINE HCL (PF) 1 % IJ SOLN: 30.0000 mL | INTRAMUSCULAR | Status: DC | PRN

## 2021-08-23 MED ORDER — BENZOCAINE-MENTHOL 20-0.5 % EX AERO: 1.0000 "application " | INHALATION_SPRAY | CUTANEOUS | Status: DC | PRN

## 2021-08-23 MED ORDER — DIPHENHYDRAMINE HCL 25 MG PO CAPS: 25.0000 mg | ORAL_CAPSULE | Freq: Four times a day (QID) | ORAL | Status: DC | PRN

## 2021-08-23 MED ORDER — LACTATED RINGERS IV SOLN: INTRAVENOUS | Status: DC

## 2021-08-23 MED ORDER — PRENATAL MULTIVITAMIN CH
1.0000 | ORAL_TABLET | Freq: Every day | ORAL | Status: DC
  Administered 2021-08-24: 1 via ORAL
  Filled 2021-08-23: qty 1

## 2021-08-23 MED ORDER — OXYTOCIN-SODIUM CHLORIDE 30-0.9 UT/500ML-% IV SOLN
1.0000 m[IU]/min | INTRAVENOUS | Status: DC
Start: 2021-08-23 — End: 2021-08-23
  Administered 2021-08-23: 2 m[IU]/min via INTRAVENOUS

## 2021-08-23 MED ORDER — LACTATED RINGERS IV SOLN: 500.0000 mL | Freq: Once | INTRAVENOUS | Status: DC

## 2021-08-23 MED ORDER — ONDANSETRON HCL 4 MG PO TABS: 4.0000 mg | ORAL_TABLET | ORAL | Status: DC | PRN

## 2021-08-23 MED ORDER — LIDOCAINE HCL (PF) 1 % IJ SOLN
INTRAMUSCULAR | Status: DC | PRN
  Administered 2021-08-23: 8 mL via EPIDURAL

## 2021-08-23 MED ORDER — ONDANSETRON HCL 4 MG/2ML IJ SOLN: 4.0000 mg | INTRAMUSCULAR | Status: DC | PRN

## 2021-08-23 MED ORDER — OXYCODONE-ACETAMINOPHEN 5-325 MG PO TABS
2.0000 | ORAL_TABLET | ORAL | Status: DC | PRN
Start: 2021-08-23 — End: 2021-08-23

## 2021-08-23 MED ORDER — WITCH HAZEL-GLYCERIN EX PADS: 1.0000 "application " | MEDICATED_PAD | CUTANEOUS | Status: DC | PRN

## 2021-08-23 MED ORDER — DIBUCAINE (PERIANAL) 1 % EX OINT: 1.0000 "application " | TOPICAL_OINTMENT | CUTANEOUS | Status: DC | PRN

## 2021-08-23 MED ORDER — OXYTOCIN BOLUS FROM INFUSION
333.0000 mL | Freq: Once | INTRAVENOUS | Status: AC
  Administered 2021-08-23: 333 mL via INTRAVENOUS

## 2021-08-23 MED ORDER — SIMETHICONE 80 MG PO CHEW: 80.0000 mg | CHEWABLE_TABLET | ORAL | Status: DC | PRN

## 2021-08-23 MED ORDER — SENNOSIDES-DOCUSATE SODIUM 8.6-50 MG PO TABS
2.0000 | ORAL_TABLET | Freq: Every day | ORAL | Status: DC
  Administered 2021-08-24: 2 via ORAL
  Filled 2021-08-23: qty 2

## 2021-08-23 MED ORDER — OXYCODONE-ACETAMINOPHEN 5-325 MG PO TABS: 1.0000 | ORAL_TABLET | ORAL | Status: DC | PRN

## 2021-08-23 MED ORDER — DIPHENHYDRAMINE HCL 50 MG/ML IJ SOLN
12.5000 mg | INTRAMUSCULAR | Status: DC | PRN
Start: 2021-08-23 — End: 2021-08-23

## 2021-08-23 MED ORDER — MISOPROSTOL 50MCG HALF TABLET
ORAL_TABLET | ORAL | Status: AC
  Filled 2021-08-23: qty 1

## 2021-08-23 NOTE — Anesthesia Preprocedure Evaluation (Signed)
Anesthesia Evaluation  Patient identified by MRN, date of birth, ID band Patient awake    Reviewed: Allergy & Precautions, NPO status , Patient's Chart, lab work & pertinent test results  Airway Mallampati: II  TM Distance: >3 FB Neck ROM: Full    Dental no notable dental hx.    Pulmonary asthma , Current Smoker,    Pulmonary exam normal breath sounds clear to auscultation       Cardiovascular hypertension, negative cardio ROS Normal cardiovascular exam Rhythm:Regular Rate:Normal     Neuro/Psych negative neurological ROS  negative psych ROS   GI/Hepatic negative GI ROS, Neg liver ROS,   Endo/Other  negative endocrine ROS  Renal/GU negative Renal ROS  negative genitourinary   Musculoskeletal negative musculoskeletal ROS (+)   Abdominal   Peds negative pediatric ROS (+)  Hematology negative hematology ROS (+)   Anesthesia Other Findings   Reproductive/Obstetrics (+) Pregnancy                             Anesthesia Physical Anesthesia Plan  ASA: 3  Anesthesia Plan: Epidural   Post-op Pain Management:    Induction:   PONV Risk Score and Plan: 1 and Treatment may vary due to age or medical condition  Airway Management Planned: Natural Airway  Additional Equipment:   Intra-op Plan:   Post-operative Plan:   Informed Consent: I have reviewed the patients History and Physical, chart, labs and discussed the procedure including the risks, benefits and alternatives for the proposed anesthesia with the patient or authorized representative who has indicated his/her understanding and acceptance.       Plan Discussed with: Anesthesiologist  Anesthesia Plan Comments:         Anesthesia Quick Evaluation

## 2021-08-23 NOTE — H&P (Signed)
HPI: Christie Ali is a 35 y.o. year old G69P3013 female at 28w0dweeks gestation who presents to L&D for IOL for Hx shoulder dystocia w/ 7-12 baby.  No birth injury. Hx reviewed in detail. R/B of vaginal birth reviewed including possible repeat shoulder dystocia and that shoulder dystocia may result in severe injury. Pt wants to proceed w/ attempt at vaginal birth.   EFW 7lb on 8/22. 61%  Nursing Staff Provider  Office Location  [place] Dating   LMP/9wk [place] Language  English Anatomy [place] Posterior placenta. Female. Lips and face not well seen-->fu WNL  Flu Vaccine   Genetic Screen  NIPS: female, low risk   AFP:     TDaP Vaccine   delined Hgb A1C or  GTT Third trimester - WNL  COVID Vaccine 06/25/20, 07/17/20 and 12/24/2020   LAB RESULTS   Rhogam  n/a Blood Type B/Positive/-- (02/23 1444)   Feeding Plan breast Antibody Negative (02/23 1444)  Contraception IUD- postplacental Rubella 3.14 (02/23 1444)  Circumcision Yes- inpatient RPR Non Reactive (02/23 1444)   Pediatrician  [facility] HBsAg Negative (02/23 1444)   Support Person  Mom/boyfriend(FOB)  Ruth/Tony  HCVAb negative  Prenatal Classes  HIV Non Reactive (02/23 1444)     BTL Consent  GBS  neg   VBAC Consent NA Pap WNL 03/2019    Hgb Electro    BP Cuff  CF     SMA     Waterbirth  '[ ]'$  Class '[ ]'$  Consent '[ ]'$  CNM visit    Induction  '[ ]'$  Orders Entered '[ ]'$ Foley Y/N   OB History     Gravida  4   Para  3   Term  3   Preterm  0   AB  1   Living  3      SAB  1   IAB  0   Ectopic  0   Multiple  0   Live Births  3          Past Medical History:  Diagnosis Date   Asthma    as a child   Pregnancy induced hypertension    Labor induced @ 40 wks   Past Surgical History:  Procedure Laterality Date   NO PAST SURGERIES     Family History: family history includes Cancer in her maternal grandmother and paternal grandmother; Hypertension in her father and mother. Social History:  reports that she has been smoking cigarettes. She  has been smoking an average of .5 packs per day. She has never used smokeless tobacco. She reports current drug use. Drug: Marijuana. She reports that she does not drink alcohol.     Maternal Diabetes: No Genetic Screening: Normal Maternal Ultrasounds/Referrals: Normal Fetal Ultrasounds or other Referrals:  None Maternal Substance Abuse:  No Significant Maternal Medications:  None Significant Maternal Lab Results:  Group B Strep negative Other Comments:   1.5 minute shoulder dystocia   Review of Systems  Constitutional:  Negative for chills and fever.  Eyes:  Negative for visual disturbance.  Gastrointestinal:  Negative for abdominal pain.  Genitourinary:  Negative for vaginal bleeding and vaginal discharge.  Neurological:  Negative for headaches.  Maternal Medical History:  Reason for admission: IOL  Contractions: Frequency: rare.   Perceived severity is mild.   Fetal activity: Perceived fetal activity is normal.   Prenatal complications: No pre-eclampsia.   Prenatal Complications - Diabetes: none.  Dilation: 10 Effacement (%): 90 Station: 0, Plus 1 Exam by::  k fields, rn Blood pressure 126/86, pulse 70, temperature 98 F (36.7 C), temperature source Oral, resp. rate 16, height '5\' 8"'$  (1.727 m), weight 89.4 kg, unknown if currently breastfeeding. Maternal Exam:  Uterine Assessment: Contraction strength is mild.  Contraction frequency is rare.  Abdomen: Patient reports no abdominal tenderness. Estimated fetal weight is 7 lb on 8/22.   Fetal presentation: vertex Introitus: Normal vulva. Normal vagina.  Pelvis: adequate for delivery.   Cervix: Cervix evaluated by digital exam.     Fetal Exam Fetal Monitor Review: Baseline rate: 125.  Variability: moderate (6-25 bpm).   Pattern: accelerations present and no decelerations.   Fetal State Assessment: Category I - tracings are normal.  Physical Exam Constitutional:      General: She is not in acute distress.    Appearance:  Normal appearance. She is not ill-appearing.  Eyes:     Conjunctiva/sclera: Conjunctivae normal.  Cardiovascular:     Rate and Rhythm: Normal rate.  Pulmonary:     Effort: Pulmonary effort is normal. No respiratory distress.  Abdominal:     Palpations: Abdomen is soft.     Tenderness: There is no abdominal tenderness.  Genitourinary:    General: Normal vulva.  Musculoskeletal:     Right lower leg: No edema.     Left lower leg: No edema.  Skin:    General: Skin is warm and dry.  Neurological:     Mental Status: She is alert.  Psychiatric:        Mood and Affect: Mood normal.    Prenatal labs: ABO, Rh: --/--/B POS (09/04 [id number]) Antibody: NEG (09/04 0921) Rubella: 3.14 (02/23 1444) RPR: Non Reactive (06/15 0909)  HBsAg: Negative (02/23 1444)  HIV: Non Reactive (06/15 0909)  GBS: Negative/-- (08/16 1150)   Assessment: 1. Labor: IOL 2. Fetal Wellbeing: Category I  3. Pain Control: Comfort measures 4. GBS: neg 5. 39.0 week IUP 6. Hx Shoulder Dystocia w. 7-12 baby. EFW similar for this baby.   Plan:  1. Admit to BS per consult with MD 2. Routine L&D orders 3. Analgesia/anesthesia PRN  4. Shoulder Dystocia precautions.   Manya Silvas 08/23/2021, 8:45 PM

## 2021-08-23 NOTE — Discharge Summary (Signed)
   Postpartum Discharge Summary  Date of Service updated 08/24/21     Patient Name: Christie Ali DOB: 10/31/1986 MRN: [id number]  Date of admission: 08/23/2021 Delivery date:08/23/2021  Delivering provider: SMITH, [place]  Date of discharge: 08/24/2021  Admitting diagnosis: Term pregnancy [Z34.90] Intrauterine pregnancy: [redacted]w[redacted]d     Secondary diagnosis:  Active Problems:   Term pregnancy  Additional problems: NA    Discharge diagnosis: Term Pregnancy Delivered                                              Post partum procedures: NA Augmentation: Pitocin Complications: None  Hospital course: Induction of Labor With Vaginal Delivery      35 y.o. yo G4P3013 at [redacted]w[redacted]d was admitted in Latent Labor on 08/23/2021. Patient had an uncomplicated labor course as follows:  Membrane Rupture Time/Date: 1:39 PM ,08/23/2021   Delivery Method:Vaginal, Spontaneous  Episiotomy: None  Lacerations:  None  Patient had an uncomplicated postpartum course.  She is ambulating, tolerating a regular diet, passing flatus, and urinating well. Patient is discharged home in stable condition on 08/24/21.  Newborn Data: Birth date:08/23/2021  Birth time:5:23 PM  Gender:Female  Living status:Living  Apgars:7 ,9  Weight:3816 g   Magnesium Sulfate received: No BMZ received: No Rhophylac:No MMR:No T-DaP:Given prenatally Flu: No Transfusion:No  Physical exam  Vitals:   08/23/21 2035 08/24/21 0007 08/24/21 0500 08/24/21 0849  BP: 121/64 120/85 116/62 121/87  Pulse: 65 65 69 71  Resp: 18 18 18 18  Temp: 98.1 F (36.7 C) 98 F (36.7 C) 98 F (36.7 C) 98.7 F (37.1 C)  TempSrc: Oral Oral Oral Oral  SpO2: 98% 99% 96% 98%  Weight:      Height:       General: alert, cooperative, and no distress Lochia: appropriate Uterine Fundus: firm Incision: Healing well with no significant drainage DVT Evaluation: No evidence of DVT seen on physical exam. Labs: Lab Results  Component Value Date   WBC 19.4 (H)  08/24/2021   HGB 11.0 (L) 08/24/2021   HCT 33.0 (L) 08/24/2021   MCV 95.9 08/24/2021   PLT 147 (L) 08/24/2021   CMP Latest Ref Rng & Units 11/11/2020  Glucose 70 - 99 mg/dL 73  BUN 6 - 20 mg/dL 10  Creatinine 0.44 - 1.00 mg/dL 1.07(H)  Sodium 135 - 145 mmol/L 138  Potassium 3.5 - 5.1 mmol/L 3.6  Chloride 98 - 111 mmol/L 104  CO2 22 - 32 mmol/L 26  Calcium 8.9 - 10.3 mg/dL 8.9  Total Protein 6.5 - 8.1 g/dL 7.2  Total Bilirubin 0.3 - 1.2 mg/dL 1.0  Alkaline Phos 38 - 126 U/L 49  AST 15 - 41 U/L 17  ALT 0 - 44 U/L 10   Edinburgh Score: Edinburgh Postnatal Depression Scale Screening Tool 10/03/2019  I have been able to laugh and see the funny side of things. 0  I have looked forward with enjoyment to things. 0  I have blamed myself unnecessarily when things went wrong. 0  I have been anxious or worried for no good reason. 0  I have felt scared or panicky for no good reason. 0  Things have been getting on top of me. 0  I have been so unhappy that I have had difficulty sleeping. 0  I have felt sad or miserable. 0  I have been so   Postpartum Discharge Summary  Date of Service updated 08/24/21     Patient Name: Christie Ali DOB: January 03, 1986 MRN: [id number]  Date of admission: 08/23/2021 Delivery date:08/23/2021  Delivering provider: Manya Silvas  Date of discharge: 08/24/2021  Admitting diagnosis: Term pregnancy [Z34.90] Intrauterine pregnancy: [redacted]w[redacted]d    Secondary diagnosis:  Active Problems:   Term pregnancy  Additional problems: NA    Discharge diagnosis: Term Pregnancy Delivered                                              Post partum procedures: NA Augmentation: Pitocin Complications: None  Hospital course: Induction of Labor With Vaginal Delivery      35y.o. yo [id number] 34w0das admitted in Latent Labor on 08/23/2021. Patient had an uncomplicated labor course as follows:  Membrane Rupture Time/Date: 1:39 PM ,08/23/2021   Delivery Method:Vaginal, Spontaneous  Episiotomy: None  Lacerations:  None  Patient had an uncomplicated postpartum course.  She is ambulating, tolerating a regular diet, passing flatus, and urinating well. Patient is discharged home in stable condition on 08/24/21.  Newborn Data: Birth date:08/23/2021  Birth time:5:23 PM  Gender:Female  Living status:Living  Apgars:7 ,9  Weight:3816 g   Magnesium Sulfate received: No BMZ received: No Rhophylac:No MMR:No T-DaP:Given prenatally Flu: No Transfusion:No  Physical exam  Vitals:   08/23/21 2035 08/24/21 0007 08/24/21 0500 08/24/21 0849  BP: 121/64 120/85 116/62 121/87  Pulse: 65 65 69 71  Resp: _0 Temp: 98.1 F (36.7 C) 98 F (36.7 C) 98 F (36.7 C) 98.7 F (37.1 C)  TempSrc: Oral Oral Oral Oral  SpO2: 98% 99% 96% 98%  Weight:      Height:       General: alert, cooperative, and no distress Lochia: appropriate Uterine Fundus: firm Incision: Healing well with no significant drainage DVT Evaluation: No evidence of DVT seen on physical exam. Labs: Lab Results  Component Value Date   WBC 19.4 (H)  08/24/2021   HGB 11.0 (L) 08/24/2021   HCT 33.0 (L) 08/24/2021   MCV 95.9 08/24/2021   PLT 147 (L) 08/24/2021   CMP Latest Ref Rng & Units 11/11/2020  Glucose 70 - 99 mg/dL 73  BUN 6 - 20 mg/dL 10  Creatinine 0.44 - 1.00 mg/dL 1.07(H)  Sodium 135 - 145 mmol/L 138  Potassium 3.5 - 5.1 mmol/L 3.6  Chloride 98 - 111 mmol/L 104  CO2 22 - 32 mmol/L 26  Calcium 8.9 - 10.3 mg/dL 8.9  Total Protein 6.5 - 8.1 g/dL 7.2  Total Bilirubin 0.3 - 1.2 mg/dL 1.0  Alkaline Phos 38 - 126 U/L 49  AST 15 - 41 U/L 17  ALT 0 - 44 U/L 10   Edinburgh Score: Edinburgh Postnatal Depression Scale Screening Tool 10/03/2019  I have been able to laugh and see the funny side of things. 0  I have looked forward with enjoyment to things. 0  I have blamed myself unnecessarily when things went wrong. 0  I have been anxious or worried for no good reason. 0  I have felt scared or panicky for no good reason. 0  Things have been getting on top of me. 0  I have been so unhappy that I have had difficulty sleeping. 0  I have felt sad or miserable. 0  I have been so

## 2021-08-23 NOTE — Progress Notes (Signed)
Christie Ali is a 35 y.o. [id number] at [redacted]w[redacted]d  Subjective: Pelvic pressure and low abd pain w/ UC's. Epidural not helping much.   Objective: BP 123/71   Pulse 63   Temp 97.7 F (36.5 C) (Oral)   Resp 16   Ht '5\' 8"'$  (1.727 m)   Wt 89.4 kg   LMP  (LMP Unknown)   BMI 29.98 kg/m    FHT:  FHR: 125 bpm, variability: mod,  accelerations:  15x15,  decelerations:  none UC:   Q 1-3, minutes, strong 8/90/-1 V. STamala Julian CNM  Labs: Results for orders placed or performed during the hospital encounter of 08/23/21 (from the past 24 hour(s))  CBC     Status: Abnormal   Collection Time: 08/23/21  9:21 AM  Result Value Ref Range   WBC 15.0 (H) 4.0 - 10.5 K/uL   RBC 3.75 (L) 3.87 - 5.11 MIL/uL   Hemoglobin 12.2 12.0 - 15.0 g/dL   HCT 36.2 36.0 - 46.0 %   MCV 96.5 80.0 - 100.0 fL   MCH 32.5 26.0 - 34.0 pg   MCHC 33.7 30.0 - 36.0 g/dL   RDW 13.1 11.5 - 15.5 %   Platelets 174 150 - 400 K/uL   nRBC 0.0 0.0 - 0.2 %  Type and screen     Status: None   Collection Time: 08/23/21  9:21 AM  Result Value Ref Range   ABO/RH(D) B POS    Antibody Screen NEG    Sample Expiration      08/26/2021,2359 Performed at [facility] [id number], [place] [id number]    Assessment / Plan: 350w0deek IUP Labor: Transition. Epidural PCA recently pushed. Expect pt will deliver soon. Fetal Wellbeing:  Category I Pain Control:  Epidural, comfort measures Anticipated MOD:  SVD  SmTamala JulianiVermontCNEwing/03/2021 4:36 PM

## 2021-08-23 NOTE — Anesthesia Procedure Notes (Signed)
Epidural Patient location during procedure: OB Start time: 08/23/2021 3:00 PM End time: 08/23/2021 3:12 PM  Staffing Anesthesiologist: Merlinda Frederick, MD Performed: anesthesiologist   Preanesthetic Checklist Completed: patient identified, IV checked, site marked, risks and benefits discussed, monitors and equipment checked, pre-op evaluation and timeout performed  Epidural Patient position: sitting Prep: DuraPrep Patient monitoring: heart rate, cardiac monitor, continuous pulse ox and blood pressure Approach: midline Location: L2-L3 Injection technique: LOR saline  Needle:  Needle type: Tuohy  Needle gauge: 17 G Needle length: 9 cm Needle insertion depth: 5 cm Catheter type: closed end flexible Catheter size: 20 Guage Catheter at skin depth: 10 cm Test dose: negative and Other  Assessment Events: blood not aspirated, injection not painful, no injection resistance and negative IV test  Additional Notes Informed consent obtained prior to proceeding including risk of failure, 1% risk of PDPH, risk of minor discomfort and bruising.  Discussed rare but serious complications including epidural abscess, permanent nerve injury, epidural hematoma.  Discussed alternatives to epidural analgesia and patient desires to proceed.  Timeout performed pre-procedure verifying patient name, procedure, and platelet count.  Patient tolerated procedure well.

## 2021-08-23 NOTE — Lactation Note (Addendum)
This note was copied from a baby's chart. Lactation Consultation Note  Patient Name: Christie Ali [id number] Date: 08/23/2021 Reason for consult: Initial assessment;Term Age:34 hours LC entered the room, mom was doing skin to skin.  Per mom, infant recently breastfeed for 10 minutes at 2100 pm.  LC did not observe latch at this time. Infant breastfeed 3 times since birth : 20 minutes in L&D, 10 minutes at 1930 pm and recently. Mom had 5 mls of colostrum she pumped in her personal DEBP she brought from home, this is her choice, after latching infant at the breast for the next feeding she plans to offer her EBM that she pumped to infant by spoon. LC discussed infant's input and output with mom. Mom made aware of O/P services, breastfeeding support groups, community resources, and our phone # for post-discharge questions.   Mom's plan: 1- Mom will continue to breastfeed infant according to cues, 8 to 12+ times within 24 hours, skin to skin. 2- Mom will give infant back any EBM that she has pumped after latching infant at the breast this is her choice. 3- Mom knows to call RN/ LC if she has any breastfeeding questions, concerns or needs assistance with latching infant at the breast.  Maternal Data Has patient been taught Hand Expression?: Yes Does the patient have breastfeeding experience prior to this delivery?: Yes How long did the patient breastfeed?: 6 months mostly pumped and bottle fed last 2 children  Feeding Mother's Current Feeding Choice: Breast Milk  LATCH Score              Lactation Tools Discussed/Used    Interventions Interventions: Breast feeding basics reviewed;Assisted with latch;Skin to skin;Support pillows;Adjust position;Hand express;Education  Discharge Pump: Personal WIC Program: No  Consult Status Consult Status: Follow-up Date: 08/24/21 Follow-up type: In-patient    Vicente Serene 08/23/2021, 9:48 PM

## 2021-08-24 LAB — CBC
HCT: 33 % — ABNORMAL LOW (ref 36.0–46.0)
Hemoglobin: 11 g/dL — ABNORMAL LOW (ref 12.0–15.0)
MCH: 32 pg (ref 26.0–34.0)
MCHC: 33.3 g/dL (ref 30.0–36.0)
MCV: 95.9 fL (ref 80.0–100.0)
Platelets: 147 10*3/uL — ABNORMAL LOW (ref 150–400)
RBC: 3.44 MIL/uL — ABNORMAL LOW (ref 3.87–5.11)
RDW: 13 % (ref 11.5–15.5)
WBC: 19.4 10*3/uL — ABNORMAL HIGH (ref 4.0–10.5)
nRBC: 0 % (ref 0.0–0.2)

## 2021-08-24 LAB — RPR: RPR Ser Ql: NONREACTIVE

## 2021-08-24 MED ORDER — IBUPROFEN 600 MG PO TABS: 600.0000 mg | ORAL_TABLET | Freq: Four times a day (QID) | ORAL | 0 refills | Status: DC

## 2021-08-24 MED ORDER — ACETAMINOPHEN 325 MG PO TABS: 650.0000 mg | ORAL_TABLET | ORAL | 0 refills | Status: DC | PRN

## 2021-08-24 NOTE — Procedures (Signed)
Verified that patient still desired post-placental IUD for contraception. Was consented prior to delivery. Consent on the chart. No contraindications to PP IUD. Time out was performed. Changed into clean set of sterile gloves.   Liletta IUD removed from inserter and grasped w/ right hand. Placed in fundus w/ left hand supporting uterus externally as right hand was withdrawn. IUD stayed in place. Strings trimmed to inside of introitus. Pt tolerated procedure well.   Patient given post procedure instructions and IUD care card with expiration date.  Patient is asked to check IUD strings periodically and follow up in 4-6 weeks for IUD/PP check. Explained that strings will need to be trimmed. IUD does not protect against STDs.    Lot [id number] Exp 09/19/2024   Christie Ali, [place] 08/23/21 1732

## 2021-08-24 NOTE — Progress Notes (Signed)
Christie Ali is a 35 y.o. [phone] at [redacted]w[redacted]d  Subjective: Mild cramping w/ cytotec.   Objective: VSS FHR Cat I Irreg UC's 4/60/-3 AROM clear fluid  Labs: NA  Assessment / Plan: 370w0deek IUP Labor: Early/IOL. Start pitocin Fetal Wellbeing:  Category I Pain Control:  PLans epidural Anticipated MOD:  SVD  SmTamala Juliani[place]ermontCN[place]/4/22 1:39 PM

## 2021-08-24 NOTE — Anesthesia Postprocedure Evaluation (Signed)
Anesthesia Post Note  Patient: Christie Ali  Procedure(s) Performed: AN AD [place]     Patient location during evaluation: Mother Baby Anesthesia Type: Epidural Level of consciousness: awake and alert and oriented Pain management: satisfactory to patient Vital Signs Assessment: post-procedure vital signs reviewed and stable Respiratory status: respiratory function stable Cardiovascular status: stable Postop Assessment: no headache, no backache, epidural receding, patient able to bend at knees, no signs of nausea or vomiting, adequate PO intake and able to ambulate Anesthetic complications: no   No notable events documented.  Last Vitals:  Vitals:   08/24/21 0007 08/24/21 0500  BP: 120/85 116/62  Pulse: 65 69  Resp: 18 18  Temp: 36.7 C 36.7 C  SpO2: 99% 96%    Last Pain:  Vitals:   08/24/21 0500  TempSrc: Oral  PainSc: 0-No pain   Pain Goal:                   Halli Equihua

## 2021-08-24 NOTE — Lactation Note (Signed)
This note was copied from a baby's chart. Lactation Consultation Note  Patient Name: Christie Ali [id number] Date: 08/24/2021 Reason for consult: Follow-up assessment;Mother's request;Difficult latch;Term;Other (Comment) (PIH) Age:34 hours  Mom sore we worked on different positions to try at home to get a deeper latch with infant on his stomach. Mom to take a breast rest for today. She decided to supplement with formula once she gets home.   Mom provided with comfort gels to reduce pain. Mom aware to not use with coconut oil.  Mom given breast shells to wear nipples are sore to the touch. Mom aware to not where them pumping, sleeping or nursing.   Plan 1. To feed based on cues 8-12x in 24hr period.  2. Mom to offer EBM first followed by formula with pace bottle feeding and extra slow nipple. Volume supplementation guide provided.  3. Mom to use manual pump fitted with 21 flange for comfort q 3 hrs for 10 min each breast. Mom working to get 21 flange for her personal electric pump.  All question answered at the end of the visit.   Maternal Data Has patient been taught Hand Expression?: Yes  Feeding Mother's Current Feeding Choice: Breast Milk and Donor Milk Nipple Type: Extra Slow Flow  LATCH Score Latch: Repeated attempts needed to sustain latch, nipple held in mouth throughout feeding, stimulation needed to elicit sucking reflex.  Audible Swallowing: Spontaneous and intermittent  Type of Nipple: Everted at rest and after stimulation  Comfort (Breast/Nipple): Filling, red/small blisters or bruises, mild/mod discomfort (Mom latching infant in cradle with infant shallow on nipple. Mom also using personal pump with 24 flange caused pain around the areola. We tried different positions infant on stomach pain reduced 4 to 0. Infant continue to get shallow pain return)  Hold (Positioning): Assistance needed to correctly position infant at breast and maintain latch.  LATCH Score:  7   Lactation Tools Discussed/Used Tools: Shells;Pump;Flanges;Coconut oil Breast pump type: Manual Pump Education: Setup, frequency, and cleaning;Milk Storage Reason for Pumping: increase stimulation Pumping frequency: every 3 hrs for 10 min on each breast.  Interventions Interventions: Breast feeding basics reviewed;Breast compression;Assisted with latch;Adjust position;Hand pump;Skin to skin;Support pillows;Breast massage;Position options;Hand express;Expressed milk;Education;Pace feeding;Shells;Comfort gels  Discharge Discharge Education: Engorgement and breast care;Warning signs for feeding baby;Outpatient recommendation Pump: Personal  Consult Status Date: 08/25/21 Follow-up type: In-patient    Brandee Markin  [place] 08/24/2021, 7:24 PM

## 2021-08-25 ENCOUNTER — Telehealth: Payer: Self-pay

## 2021-08-25 NOTE — Telephone Encounter (Signed)
Transition Care Management Follow-up Telephone Call Date of discharge and from where: 08/24/2021-[facility] [place]  How have you been since you were released from the hospital? Patient stated she is doing fine. Any questions or concerns? No  Items Reviewed: Did the pt receive and understand the discharge instructions provided? Yes  Medications obtained and verified? Yes  Other? No  Any new allergies since your discharge? No  Dietary orders reviewed? N/A Do you have support at home? Yes   Home Care and Equipment/Supplies: Were home health services ordered? not applicable If so, what is the name of the agency? N/A  Has the agency set up a time to come to the patient's home? not applicable Were any new equipment or medical supplies ordered?  No What is the name of the medical supply agency? N/A Were you able to get the supplies/equipment? not applicable Do you have any questions related to the use of the equipment or supplies? No  Functional Questionnaire: (I = Independent and D = Dependent) ADLs: I  Bathing/Dressing- I  Meal Prep- I  Eating- I  Maintaining continence- I  Transferring/Ambulation- I  Managing Meds- I  Follow up appointments reviewed:  PCP Hospital f/u appt confirmed? No   Specialist Hospital f/u appt confirmed? No   Are transportation arrangements needed? No  If their condition worsens, is the pt aware to call PCP or go to the Emergency Dept.? Yes Was the patient provided with contact information for the PCP's office or ED? Yes Was to pt encouraged to call back with questions or concerns? Yes

## 2021-08-26 ENCOUNTER — Encounter: Payer: Medicaid Other | Admitting: Family Medicine

## 2021-08-30 ENCOUNTER — Inpatient Hospital Stay (HOSPITAL_COMMUNITY): Admit: 2021-08-30 | Payer: Self-pay

## 2021-09-05 ENCOUNTER — Telehealth (HOSPITAL_COMMUNITY): Payer: Self-pay

## 2021-09-05 NOTE — Telephone Encounter (Signed)
"  I'm doing good." Patient has no questions or concerns about her healing.  "He's good. Eating around the clock, gaining weight. He sleeps in his bassinet." RN reviewed ABC's of safe sleep with patient. Patient declines any questions or concerns about baby.  EPDS score is 0.  Sharyn Lull [facility] 09/05/2021,1507

## 2021-09-23 ENCOUNTER — Encounter: Payer: Self-pay | Admitting: Family Medicine

## 2021-09-23 ENCOUNTER — Other Ambulatory Visit: Payer: Self-pay

## 2021-09-23 ENCOUNTER — Other Ambulatory Visit (HOSPITAL_COMMUNITY)
Admission: RE | Admit: 2021-09-23 | Discharge: 2021-09-23 | Disposition: A | Discharge status: Home / Self Care | Payer: Medicaid Other | Source: Ambulatory Visit | Attending: Family Medicine | Admitting: Family Medicine

## 2021-09-23 ENCOUNTER — Ambulatory Visit (INDEPENDENT_AMBULATORY_CARE_PROVIDER_SITE_OTHER): Payer: Medicaid Other | Admitting: Family Medicine

## 2021-09-23 VITALS — BP 138/90 | HR 76 | Ht 67.0 in | Wt 176.0 lb

## 2021-09-23 DIAGNOSIS — N898 Other specified noninflammatory disorders of vagina: Secondary | ICD-10-CM | POA: Diagnosis not present

## 2021-09-23 NOTE — Progress Notes (Signed)
[facility] Visit Note  Christie Ali is a 35 y.o. [id number] female who presents for a postpartum visit. She is 4 weeks postpartum following a normal spontaneous vaginal delivery.  I have fully reviewed the prenatal and intrapartum course. The delivery was at 12 gestational weeks.  Anesthesia: epidural. Postpartum course has been unremarkable. Baby is doing well. Baby is feeding by both breast and bottle - Similac Alimentum. Bleeding staining only. Bowel function is  every other day having a BM  . Bladder function is normal. Patient is sexually active. Contraception method is IUD. Postpartum depression screening: negative.   The pregnancy intention screening data noted above was reviewed. Potential methods of contraception were discussed. The patient elected to proceed with No data recorded.   Edinburgh Postnatal Depression Scale - 09/23/21 1555       Edinburgh Postnatal Depression Scale:  In the Past 7 Days   I have been able to laugh and see the funny side of things. 0    I have looked forward with enjoyment to things. 0    I have blamed myself unnecessarily when things went wrong. 0    I have been anxious or worried for no good reason. 0    I have felt scared or panicky for no good reason. 0    Things have been getting on top of me. 0    I have been so unhappy that I have had difficulty sleeping. 0    I have felt sad or miserable. 0    I have been so unhappy that I have been crying. 0    The thought of harming myself has occurred to me. 0    Edinburgh Postnatal Depression Scale Total 0             Health Maintenance Due  Topic Date Due   COVID-19 Vaccine (1) Never done   INFLUENZA VACCINE  Never done    The following portions of the patient's history were reviewed and updated as appropriate: allergies, current medications, past family history, past medical history, past social history, past surgical history, and problem list.  Review of Systems Pertinent items are  noted in HPI.  Objective:  BP 138/90   Pulse 76   Ht 5\' 7"  (1.702 m)   Wt 176 lb (79.8 kg)   Breastfeeding Yes   BMI 27.57 kg/m    General:  alert, cooperative, and appears stated age   Breasts:  not indicated  Lungs: clear to auscultation bilaterally  Heart:  regular rate and rhythm, S1, S2 normal, no murmur, click, rub or gallop  Abdomen: soft, non-tender; bowel sounds normal; no masses,  no organomegaly   Wound na  GU exam:  normal- IUD strings visible, trimmed       Assessment:    There are no diagnoses linked to this encounter.  normal postpartum exam.   Plan:   Essential components of care per ACOG recommendations:  1.  Mood and well being: Patient with negative depression screening today. Reviewed local resources for support.  - Patient tobacco use? Yes. Patient desires to quit? No.  Has reduced and not smoking often.   - hx of drug use? No.    2. Infant care and feeding:  -Patient currently breastmilk feeding? Yes. Discussed returning to work and pumping. Reviewed importance of draining breast regularly to support lactation.  -Social determinants of health (SDOH) reviewed in EPIC. No concerns- none.   3. Sexuality, contraception and birth spacing - Patient  does not want a pregnancy in the next year.  Desired family size is 3 children.  - Reviewed forms of contraception in tiered fashion. Patient desired IUD today.   - Discussed birth spacing of 18 months  4. Sleep and fatigue -Encouraged family/partner/community support of 4 hrs of uninterrupted sleep to help with mood and fatigue  5. Physical Recovery  - Discussed patients delivery and complications. She describes her labor as good. - Patient had a Vaginal, no problems at delivery.  Patient reports none but delivery note notes a shoulder dystocia. Patient had a  no  laceration. Perineal healing reviewed. Patient expressed understanding - Patient has urinary incontinence? No. - Patient is safe to resume  physical and sexual activity  6.  Health Maintenance - HM due items addressed Yes - Last pap smear  Diagnosis  Date Value Ref Range Status  03/26/2019   Final   NEGATIVE FOR INTRAEPITHELIAL LESIONS OR MALIGNANCY.  03/26/2019   Final   FUNGAL ORGANISMS PRESENT CONSISTENT WITH CANDIDA SPP.   Pap smear not done at today's visit.  -Breast Cancer screening indicated? No.   7. Chronic Disease/Pregnancy Condition follow up: None  - PCP follow up  Caren Macadam, [place] [facility], [place]

## 2021-09-25 LAB — CERVICOVAGINAL ANCILLARY ONLY
Bacterial Vaginitis (gardnerella): NEGATIVE
Candida Glabrata: NEGATIVE
Candida Vaginitis: NEGATIVE
Comment: NEGATIVE
Comment: NEGATIVE
Comment: NEGATIVE

## 2021-09-28 ENCOUNTER — Encounter: Payer: Self-pay | Admitting: Family Medicine

## 2021-12-06 DIAGNOSIS — Z20822 Contact with and (suspected) exposure to covid-19: Secondary | ICD-10-CM | POA: Diagnosis not present

## 2022-04-05 DIAGNOSIS — F321 Major depressive disorder, single episode, moderate: Secondary | ICD-10-CM | POA: Diagnosis not present

## 2022-04-06 DIAGNOSIS — F321 Major depressive disorder, single episode, moderate: Secondary | ICD-10-CM | POA: Diagnosis not present

## 2022-04-07 DIAGNOSIS — F321 Major depressive disorder, single episode, moderate: Secondary | ICD-10-CM | POA: Diagnosis not present

## 2022-04-08 DIAGNOSIS — F321 Major depressive disorder, single episode, moderate: Secondary | ICD-10-CM | POA: Diagnosis not present

## 2022-04-12 DIAGNOSIS — F321 Major depressive disorder, single episode, moderate: Secondary | ICD-10-CM | POA: Diagnosis not present

## 2022-04-13 DIAGNOSIS — F321 Major depressive disorder, single episode, moderate: Secondary | ICD-10-CM | POA: Diagnosis not present

## 2022-04-14 DIAGNOSIS — F321 Major depressive disorder, single episode, moderate: Secondary | ICD-10-CM | POA: Diagnosis not present

## 2022-04-15 DIAGNOSIS — F321 Major depressive disorder, single episode, moderate: Secondary | ICD-10-CM | POA: Diagnosis not present

## 2022-04-16 DIAGNOSIS — F321 Major depressive disorder, single episode, moderate: Secondary | ICD-10-CM | POA: Diagnosis not present

## 2022-04-19 DIAGNOSIS — F321 Major depressive disorder, single episode, moderate: Secondary | ICD-10-CM | POA: Diagnosis not present

## 2022-04-20 DIAGNOSIS — F321 Major depressive disorder, single episode, moderate: Secondary | ICD-10-CM | POA: Diagnosis not present

## 2022-04-21 DIAGNOSIS — F321 Major depressive disorder, single episode, moderate: Secondary | ICD-10-CM | POA: Diagnosis not present

## 2022-04-22 DIAGNOSIS — F321 Major depressive disorder, single episode, moderate: Secondary | ICD-10-CM | POA: Diagnosis not present

## 2022-04-23 DIAGNOSIS — F321 Major depressive disorder, single episode, moderate: Secondary | ICD-10-CM | POA: Diagnosis not present

## 2022-04-24 DIAGNOSIS — F321 Major depressive disorder, single episode, moderate: Secondary | ICD-10-CM | POA: Diagnosis not present

## 2022-04-25 DIAGNOSIS — F321 Major depressive disorder, single episode, moderate: Secondary | ICD-10-CM | POA: Diagnosis not present

## 2022-04-26 DIAGNOSIS — F321 Major depressive disorder, single episode, moderate: Secondary | ICD-10-CM | POA: Diagnosis not present

## 2022-04-27 DIAGNOSIS — F321 Major depressive disorder, single episode, moderate: Secondary | ICD-10-CM | POA: Diagnosis not present

## 2022-04-28 DIAGNOSIS — F321 Major depressive disorder, single episode, moderate: Secondary | ICD-10-CM | POA: Diagnosis not present

## 2022-04-29 DIAGNOSIS — F321 Major depressive disorder, single episode, moderate: Secondary | ICD-10-CM | POA: Diagnosis not present

## 2022-04-30 DIAGNOSIS — F321 Major depressive disorder, single episode, moderate: Secondary | ICD-10-CM | POA: Diagnosis not present

## 2022-05-01 DIAGNOSIS — F321 Major depressive disorder, single episode, moderate: Secondary | ICD-10-CM | POA: Diagnosis not present

## 2022-05-02 DIAGNOSIS — F321 Major depressive disorder, single episode, moderate: Secondary | ICD-10-CM | POA: Diagnosis not present

## 2022-05-03 DIAGNOSIS — F321 Major depressive disorder, single episode, moderate: Secondary | ICD-10-CM | POA: Diagnosis not present

## 2022-05-04 DIAGNOSIS — F321 Major depressive disorder, single episode, moderate: Secondary | ICD-10-CM | POA: Diagnosis not present

## 2022-05-05 DIAGNOSIS — F321 Major depressive disorder, single episode, moderate: Secondary | ICD-10-CM | POA: Diagnosis not present

## 2022-05-06 DIAGNOSIS — F321 Major depressive disorder, single episode, moderate: Secondary | ICD-10-CM | POA: Diagnosis not present

## 2022-05-07 DIAGNOSIS — F321 Major depressive disorder, single episode, moderate: Secondary | ICD-10-CM | POA: Diagnosis not present

## 2022-05-08 DIAGNOSIS — F321 Major depressive disorder, single episode, moderate: Secondary | ICD-10-CM | POA: Diagnosis not present

## 2022-05-09 DIAGNOSIS — F321 Major depressive disorder, single episode, moderate: Secondary | ICD-10-CM | POA: Diagnosis not present

## 2022-05-10 DIAGNOSIS — F321 Major depressive disorder, single episode, moderate: Secondary | ICD-10-CM | POA: Diagnosis not present

## 2022-05-11 DIAGNOSIS — F321 Major depressive disorder, single episode, moderate: Secondary | ICD-10-CM | POA: Diagnosis not present

## 2022-05-12 DIAGNOSIS — F321 Major depressive disorder, single episode, moderate: Secondary | ICD-10-CM | POA: Diagnosis not present

## 2022-05-13 DIAGNOSIS — F321 Major depressive disorder, single episode, moderate: Secondary | ICD-10-CM | POA: Diagnosis not present

## 2022-05-14 DIAGNOSIS — F321 Major depressive disorder, single episode, moderate: Secondary | ICD-10-CM | POA: Diagnosis not present

## 2022-05-15 DIAGNOSIS — F321 Major depressive disorder, single episode, moderate: Secondary | ICD-10-CM | POA: Diagnosis not present

## 2022-05-16 DIAGNOSIS — F321 Major depressive disorder, single episode, moderate: Secondary | ICD-10-CM | POA: Diagnosis not present

## 2022-05-17 DIAGNOSIS — F321 Major depressive disorder, single episode, moderate: Secondary | ICD-10-CM | POA: Diagnosis not present

## 2022-05-18 DIAGNOSIS — F321 Major depressive disorder, single episode, moderate: Secondary | ICD-10-CM | POA: Diagnosis not present

## 2022-05-19 DIAGNOSIS — F321 Major depressive disorder, single episode, moderate: Secondary | ICD-10-CM | POA: Diagnosis not present

## 2022-05-20 DIAGNOSIS — F321 Major depressive disorder, single episode, moderate: Secondary | ICD-10-CM | POA: Diagnosis not present

## 2022-05-21 DIAGNOSIS — F321 Major depressive disorder, single episode, moderate: Secondary | ICD-10-CM | POA: Diagnosis not present

## 2022-05-22 DIAGNOSIS — F321 Major depressive disorder, single episode, moderate: Secondary | ICD-10-CM | POA: Diagnosis not present

## 2022-05-23 DIAGNOSIS — F321 Major depressive disorder, single episode, moderate: Secondary | ICD-10-CM | POA: Diagnosis not present

## 2022-05-24 DIAGNOSIS — F321 Major depressive disorder, single episode, moderate: Secondary | ICD-10-CM | POA: Diagnosis not present

## 2022-05-25 DIAGNOSIS — F321 Major depressive disorder, single episode, moderate: Secondary | ICD-10-CM | POA: Diagnosis not present

## 2022-05-26 DIAGNOSIS — F321 Major depressive disorder, single episode, moderate: Secondary | ICD-10-CM | POA: Diagnosis not present

## 2022-05-27 DIAGNOSIS — F321 Major depressive disorder, single episode, moderate: Secondary | ICD-10-CM | POA: Diagnosis not present

## 2022-05-28 DIAGNOSIS — F321 Major depressive disorder, single episode, moderate: Secondary | ICD-10-CM | POA: Diagnosis not present

## 2022-05-29 DIAGNOSIS — F321 Major depressive disorder, single episode, moderate: Secondary | ICD-10-CM | POA: Diagnosis not present

## 2022-05-30 DIAGNOSIS — F321 Major depressive disorder, single episode, moderate: Secondary | ICD-10-CM | POA: Diagnosis not present

## 2022-05-31 DIAGNOSIS — F321 Major depressive disorder, single episode, moderate: Secondary | ICD-10-CM | POA: Diagnosis not present

## 2022-06-01 DIAGNOSIS — F321 Major depressive disorder, single episode, moderate: Secondary | ICD-10-CM | POA: Diagnosis not present

## 2022-06-02 DIAGNOSIS — F321 Major depressive disorder, single episode, moderate: Secondary | ICD-10-CM | POA: Diagnosis not present

## 2022-06-03 DIAGNOSIS — F321 Major depressive disorder, single episode, moderate: Secondary | ICD-10-CM | POA: Diagnosis not present

## 2022-06-04 DIAGNOSIS — F321 Major depressive disorder, single episode, moderate: Secondary | ICD-10-CM | POA: Diagnosis not present

## 2022-06-05 DIAGNOSIS — F321 Major depressive disorder, single episode, moderate: Secondary | ICD-10-CM | POA: Diagnosis not present

## 2022-06-06 DIAGNOSIS — F321 Major depressive disorder, single episode, moderate: Secondary | ICD-10-CM | POA: Diagnosis not present

## 2022-06-07 DIAGNOSIS — F321 Major depressive disorder, single episode, moderate: Secondary | ICD-10-CM | POA: Diagnosis not present

## 2022-06-08 DIAGNOSIS — F321 Major depressive disorder, single episode, moderate: Secondary | ICD-10-CM | POA: Diagnosis not present

## 2022-06-09 DIAGNOSIS — F321 Major depressive disorder, single episode, moderate: Secondary | ICD-10-CM | POA: Diagnosis not present

## 2022-06-11 DIAGNOSIS — F321 Major depressive disorder, single episode, moderate: Secondary | ICD-10-CM | POA: Diagnosis not present

## 2022-06-12 DIAGNOSIS — F321 Major depressive disorder, single episode, moderate: Secondary | ICD-10-CM | POA: Diagnosis not present

## 2022-06-13 DIAGNOSIS — F321 Major depressive disorder, single episode, moderate: Secondary | ICD-10-CM | POA: Diagnosis not present

## 2022-06-14 DIAGNOSIS — F321 Major depressive disorder, single episode, moderate: Secondary | ICD-10-CM | POA: Diagnosis not present

## 2022-06-15 DIAGNOSIS — F321 Major depressive disorder, single episode, moderate: Secondary | ICD-10-CM | POA: Diagnosis not present

## 2022-06-16 DIAGNOSIS — F321 Major depressive disorder, single episode, moderate: Secondary | ICD-10-CM | POA: Diagnosis not present

## 2022-06-17 DIAGNOSIS — F321 Major depressive disorder, single episode, moderate: Secondary | ICD-10-CM | POA: Diagnosis not present

## 2022-06-18 DIAGNOSIS — F321 Major depressive disorder, single episode, moderate: Secondary | ICD-10-CM | POA: Diagnosis not present

## 2022-06-19 DIAGNOSIS — F321 Major depressive disorder, single episode, moderate: Secondary | ICD-10-CM | POA: Diagnosis not present

## 2022-06-20 DIAGNOSIS — F321 Major depressive disorder, single episode, moderate: Secondary | ICD-10-CM | POA: Diagnosis not present

## 2022-06-21 DIAGNOSIS — F321 Major depressive disorder, single episode, moderate: Secondary | ICD-10-CM | POA: Diagnosis not present

## 2022-06-22 DIAGNOSIS — F321 Major depressive disorder, single episode, moderate: Secondary | ICD-10-CM | POA: Diagnosis not present

## 2022-06-23 DIAGNOSIS — F321 Major depressive disorder, single episode, moderate: Secondary | ICD-10-CM | POA: Diagnosis not present

## 2022-06-24 DIAGNOSIS — F321 Major depressive disorder, single episode, moderate: Secondary | ICD-10-CM | POA: Diagnosis not present

## 2022-06-25 DIAGNOSIS — F321 Major depressive disorder, single episode, moderate: Secondary | ICD-10-CM | POA: Diagnosis not present

## 2022-06-26 DIAGNOSIS — F321 Major depressive disorder, single episode, moderate: Secondary | ICD-10-CM | POA: Diagnosis not present

## 2022-06-27 DIAGNOSIS — F321 Major depressive disorder, single episode, moderate: Secondary | ICD-10-CM | POA: Diagnosis not present

## 2022-06-28 DIAGNOSIS — F321 Major depressive disorder, single episode, moderate: Secondary | ICD-10-CM | POA: Diagnosis not present

## 2022-06-29 DIAGNOSIS — F321 Major depressive disorder, single episode, moderate: Secondary | ICD-10-CM | POA: Diagnosis not present

## 2022-06-30 DIAGNOSIS — F321 Major depressive disorder, single episode, moderate: Secondary | ICD-10-CM | POA: Diagnosis not present

## 2022-07-01 DIAGNOSIS — F321 Major depressive disorder, single episode, moderate: Secondary | ICD-10-CM | POA: Diagnosis not present

## 2022-07-02 DIAGNOSIS — F321 Major depressive disorder, single episode, moderate: Secondary | ICD-10-CM | POA: Diagnosis not present

## 2022-07-03 DIAGNOSIS — F321 Major depressive disorder, single episode, moderate: Secondary | ICD-10-CM | POA: Diagnosis not present

## 2022-07-04 DIAGNOSIS — F321 Major depressive disorder, single episode, moderate: Secondary | ICD-10-CM | POA: Diagnosis not present

## 2022-07-05 DIAGNOSIS — F321 Major depressive disorder, single episode, moderate: Secondary | ICD-10-CM | POA: Diagnosis not present

## 2022-07-06 DIAGNOSIS — F321 Major depressive disorder, single episode, moderate: Secondary | ICD-10-CM | POA: Diagnosis not present

## 2022-07-07 DIAGNOSIS — F321 Major depressive disorder, single episode, moderate: Secondary | ICD-10-CM | POA: Diagnosis not present

## 2022-07-08 DIAGNOSIS — F321 Major depressive disorder, single episode, moderate: Secondary | ICD-10-CM | POA: Diagnosis not present

## 2022-07-09 DIAGNOSIS — F321 Major depressive disorder, single episode, moderate: Secondary | ICD-10-CM | POA: Diagnosis not present

## 2022-07-10 DIAGNOSIS — F321 Major depressive disorder, single episode, moderate: Secondary | ICD-10-CM | POA: Diagnosis not present

## 2022-07-11 DIAGNOSIS — F321 Major depressive disorder, single episode, moderate: Secondary | ICD-10-CM | POA: Diagnosis not present

## 2022-07-12 DIAGNOSIS — F321 Major depressive disorder, single episode, moderate: Secondary | ICD-10-CM | POA: Diagnosis not present

## 2022-07-13 DIAGNOSIS — F321 Major depressive disorder, single episode, moderate: Secondary | ICD-10-CM | POA: Diagnosis not present

## 2022-07-14 DIAGNOSIS — F321 Major depressive disorder, single episode, moderate: Secondary | ICD-10-CM | POA: Diagnosis not present

## 2022-07-16 DIAGNOSIS — F321 Major depressive disorder, single episode, moderate: Secondary | ICD-10-CM | POA: Diagnosis not present

## 2022-07-17 DIAGNOSIS — F321 Major depressive disorder, single episode, moderate: Secondary | ICD-10-CM | POA: Diagnosis not present

## 2022-07-18 DIAGNOSIS — F321 Major depressive disorder, single episode, moderate: Secondary | ICD-10-CM | POA: Diagnosis not present

## 2022-07-19 DIAGNOSIS — F321 Major depressive disorder, single episode, moderate: Secondary | ICD-10-CM | POA: Diagnosis not present

## 2022-07-20 DIAGNOSIS — F321 Major depressive disorder, single episode, moderate: Secondary | ICD-10-CM | POA: Diagnosis not present

## 2022-07-21 DIAGNOSIS — F321 Major depressive disorder, single episode, moderate: Secondary | ICD-10-CM | POA: Diagnosis not present

## 2022-07-22 DIAGNOSIS — F321 Major depressive disorder, single episode, moderate: Secondary | ICD-10-CM | POA: Diagnosis not present

## 2022-07-23 DIAGNOSIS — F321 Major depressive disorder, single episode, moderate: Secondary | ICD-10-CM | POA: Diagnosis not present

## 2022-07-24 DIAGNOSIS — F321 Major depressive disorder, single episode, moderate: Secondary | ICD-10-CM | POA: Diagnosis not present

## 2022-07-25 DIAGNOSIS — F321 Major depressive disorder, single episode, moderate: Secondary | ICD-10-CM | POA: Diagnosis not present

## 2022-11-09 ENCOUNTER — Ambulatory Visit
Admission: EM | Admit: 2022-11-09 | Discharge: 2022-11-09 | Disposition: A | Discharge status: Home / Self Care | Payer: Medicaid Other | Attending: Internal Medicine | Admitting: Internal Medicine

## 2022-11-09 DIAGNOSIS — G44019 Episodic cluster headache, not intractable: Secondary | ICD-10-CM | POA: Diagnosis not present

## 2022-11-09 MED ORDER — KETOROLAC TROMETHAMINE 30 MG/ML IJ SOLN: 30.0000 mg | Freq: Once | INTRAMUSCULAR | Status: DC

## 2022-11-09 MED ORDER — IBUPROFEN 600 MG PO TABS: 600.0000 mg | ORAL_TABLET | Freq: Four times a day (QID) | ORAL | 0 refills | Status: DC | PRN

## 2022-11-09 MED ORDER — SUMATRIPTAN SUCCINATE 6 MG/0.5ML ~~LOC~~ SOLN: 6.0000 mg | Freq: Once | SUBCUTANEOUS | Status: DC

## 2022-11-09 MED ORDER — SUMATRIPTAN SUCCINATE 50 MG PO TABS: 50.0000 mg | ORAL_TABLET | Freq: Once | ORAL | 0 refills | Status: DC

## 2022-11-09 MED ORDER — KETOROLAC TROMETHAMINE 30 MG/ML IJ SOLN
30.0000 mg | Freq: Once | INTRAMUSCULAR | Status: AC
  Administered 2022-11-09: 30 mg via INTRAMUSCULAR

## 2022-11-09 NOTE — Discharge Instructions (Addendum)
Please take medications as prescribed Please take adequate hydration. If you have worsening symptoms please return to urgent care to be reevaluated.

## 2022-11-09 NOTE — ED Provider Notes (Signed)
Christie Ali    CSN: [id number] Arrival date & time: 11/09/22  1045      History   Chief Complaint Chief Complaint  Patient presents with   Facial Pain    HPI Christie Ali is a 36 y.o. female comes to the urgent care with.   HPI  Past Medical History:  Diagnosis Date   Asthma    as a child   Pregnancy induced hypertension    Labor induced @ 40 wks    Patient Active Problem List   Diagnosis Date Noted   HSV-2 infection 05/30/2018   Asthma 11/30/2017    Past Surgical History:  Procedure Laterality Date   NO PAST SURGERIES      OB History     Gravida  4   Para  3   Term  3   Preterm  0   AB  1   Living  3      SAB  1   IAB  0   Ectopic  0   Multiple  0   Live Births  3            Home Medications    Prior to Admission medications   Medication Sig Start Date End Date Taking? Authorizing Provider  ibuprofen (ADVIL) 600 MG tablet Take 1 tablet (600 mg total) by mouth every 6 (six) hours as needed. 11/09/22  Yes Shrita Thien, Myrene Galas, MD  SUMAtriptan (IMITREX) 50 MG tablet Take 1 tablet (50 mg total) by mouth once for 1 dose. May repeat in 2 hours if headache persists or recurs. 11/09/22 11/09/22 Yes Olis Viverette, Myrene Galas, MD  Levonorgestrel (LILETTA, 52 MG, IU) by Intrauterine route.    [provider]  Prenatal Vit-Fe Fumarate-FA (MULTIVITAMIN-PRENATAL) 27-0.8 MG TABS tablet Take 1 tablet by mouth daily at 12 noon. 10/21/20   Nugent, Gerrie Nordmann, NP    Family History Family History  Problem Relation Age of Onset   Hypertension Mother    Hypertension Father    Cancer Maternal Grandmother    Cancer Paternal Grandmother     Social History Social History   Tobacco Use   Smoking status: Every Day    Packs/day: 0.50    Types: Cigarettes   Smokeless tobacco: Never  Vaping Use   Vaping Use: Never used  Substance Use Topics   Alcohol use: No   Drug use: Yes    Types: Marijuana    Comment: current use - last 2 days  ago     Allergies   Sulfa antibiotics and Other   Review of Systems Review of Systems   Physical Exam Triage Vital Signs ED Triage Vitals  Enc Vitals Group     BP 11/09/22 1106 (!) 144/100     Pulse Rate 11/09/22 1106 69     Resp 11/09/22 1106 18     Temp 11/09/22 1106 98.4 F (36.9 C)     Temp src --      SpO2 11/09/22 1106 98 %     Weight --      Height --      Head Circumference --      Peak Flow --      Pain Score 11/09/22 1107 10     Pain Loc --      Pain Edu? --      Excl. in [place]? --    No data found.  Updated Vital Signs BP (!) 144/100   Pulse 69   Temp 98.4  F (36.9 C)   Resp 18   SpO2 98%   Breastfeeding No   Visual Acuity Right Eye Distance:   Left Eye Distance:   Bilateral Distance:    Right Eye Near:   Left Eye Near:    Bilateral Near:     Physical Exam   UC Treatments / Results  Labs (all labs ordered are listed, but only abnormal results are displayed) Labs Reviewed - No data to display  EKG   Radiology No results found.  Procedures Procedures (including critical care time)  Medications Ordered in UC Medications  SUMAtriptan (IMITREX) injection 6 mg (6 mg Subcutaneous Not Given 11/09/22 1207)  ketorolac (TORADOL) 30 MG/ML injection 30 mg (30 mg Intramuscular Given 11/09/22 1155)    Initial Impression / Assessment and Plan / UC Course  I have reviewed the triage vital signs and the nursing notes.  Pertinent labs & imaging results that were available during my care of the patient were reviewed by me and considered in my medical decision making (see chart for details).     *** Final Clinical Impressions(s) / UC Diagnoses   Final diagnoses:  Episodic cluster headache, not intractable     Discharge Instructions      Please take medications as prescribed Please take adequate hydration. If you have worsening symptoms please return to urgent care to be reevaluated.   ED Prescriptions     Medication Sig Dispense  Auth. Provider   SUMAtriptan (IMITREX) 50 MG tablet Take 1 tablet (50 mg total) by mouth once for 1 dose. May repeat in 2 hours if headache persists or recurs. 15 tablet Kanylah Muench, Myrene Galas, MD   ibuprofen (ADVIL) 600 MG tablet Take 1 tablet (600 mg total) by mouth every 6 (six) hours as needed. 30 tablet Ansleigh Safer, Myrene Galas, MD      PDMP not reviewed this encounter.

## 2022-11-09 NOTE — ED Triage Notes (Signed)
Pt. Presents to UC w/c/ pressure behind her left eye that started last night ago.

## 2023-03-03 DIAGNOSIS — F411 Generalized anxiety disorder: Secondary | ICD-10-CM | POA: Diagnosis not present

## 2023-03-04 DIAGNOSIS — F411 Generalized anxiety disorder: Secondary | ICD-10-CM | POA: Diagnosis not present

## 2023-03-11 DIAGNOSIS — F411 Generalized anxiety disorder: Secondary | ICD-10-CM | POA: Diagnosis not present

## 2023-03-12 DIAGNOSIS — F411 Generalized anxiety disorder: Secondary | ICD-10-CM | POA: Diagnosis not present

## 2023-10-29 ENCOUNTER — Other Ambulatory Visit: Payer: Self-pay

## 2023-10-29 ENCOUNTER — Emergency Department
Admission: EM | Admit: 2023-10-29 | Discharge: 2023-10-29 | Disposition: A | Discharge status: Home / Self Care | Payer: Medicaid Other | Attending: Emergency Medicine | Admitting: Emergency Medicine

## 2023-10-29 ENCOUNTER — Emergency Department: Payer: Medicaid Other

## 2023-10-29 DIAGNOSIS — X58XXXA Exposure to other specified factors, initial encounter: Secondary | ICD-10-CM | POA: Diagnosis not present

## 2023-10-29 DIAGNOSIS — S93402A Sprain of unspecified ligament of left ankle, initial encounter: Secondary | ICD-10-CM | POA: Diagnosis not present

## 2023-10-29 DIAGNOSIS — S99912A Unspecified injury of left ankle, initial encounter: Secondary | ICD-10-CM | POA: Diagnosis present

## 2023-10-29 DIAGNOSIS — M7989 Other specified soft tissue disorders: Secondary | ICD-10-CM | POA: Diagnosis not present

## 2023-10-29 DIAGNOSIS — M25571 Pain in right ankle and joints of right foot: Secondary | ICD-10-CM | POA: Diagnosis not present

## 2023-10-29 DIAGNOSIS — M25572 Pain in left ankle and joints of left foot: Secondary | ICD-10-CM

## 2023-10-29 NOTE — ED Notes (Signed)
Patient discharged at this time. Ambulated to lobby with independent and steady gait. Breathing unlabored speaking in full sentences. Verbalized understanding of all discharge, follow up, and medication teaching. Discharged homed with all belongings.   

## 2023-10-29 NOTE — ED Triage Notes (Addendum)
Pt to ed from home via POV for ankle pain in her right ankle. Pt denies any trauma or falls. Pt is CAOx4, in no acute distress and ambulatory in triage. Pt advised it was a gradual pain. It wasn't all of a sudden. Family has HX of gout. Pt does not have a regular PCP.

## 2023-10-29 NOTE — ED Provider Notes (Signed)
[facility] Provider Note    Event Date/Time   First MD Initiated Contact with Patient 10/29/23 [phone]     (approximate)   History   Ankle Pain (Right x3 days)   HPI  Christie Ali is a 37 y.o. female who presents to the ED for evaluation of Ankle Pain (Right x3 days)   Patient presents for 3 days of atraumatic right ankle pain.  Still able to ambulate, no swelling, no fevers   Physical Exam   Triage Vital Signs: ED Triage Vitals [10/29/23 0014]  Encounter Vitals Group     BP 130/77     Systolic BP Percentile      Diastolic BP Percentile      Pulse Rate 77     Resp 16     Temp 98 F (36.7 C)     Temp Source Oral     SpO2 98 %     Weight 180 lb (81.6 kg)     Height 5\' 7"  (1.702 m)     Head Circumference      Peak Flow      Pain Score 9     Pain Loc      Pain Education      Exclude from Growth Chart     Most recent vital signs: Vitals:   10/29/23 0014 10/29/23 0405  BP: 130/77 (!) 104/56  Pulse: 77 65  Resp: 16 17  Temp: 98 F (36.7 C) 98.1 F (36.7 C)  SpO2: 98% 98%    General: Awake, no distress.  CV:  Good peripheral perfusion.  Resp:  Normal effort.  Abd:  No distention.  MSK:  No deformity noted.  Neuro:  No focal deficits appreciated. Other:  Mild tenderness to palpation inferior to the medial malleolus of the right ankle.  No soft tissue swelling.  No malleoli or tenderness.  No evidence of neurovascular deficits.  Soft and painless calf   ED Results / Procedures / Treatments   Labs (all labs ordered are listed, but only abnormal results are displayed) Labs Reviewed - No data to display  EKG   RADIOLOGY Pain from of the right ankle without evidence of fracture or dislocation  Official radiology report(s): DG Ankle Complete Right  Result Date: 10/29/2023 CLINICAL DATA:  Atraumatic right ankle pain. EXAM: RIGHT ANKLE - COMPLETE 3+ VIEW COMPARISON:  None Available. FINDINGS: There is no evidence of an acute  fracture, dislocation, or joint effusion. A small chronic versus congenital deformity is seen along the medial aspect of the right medial malleolus. There is no evidence of significant arthropathy. Mild anterior soft tissue swelling is seen. IMPRESSION: Mild anterior soft tissue swelling without an acute osseous abnormality. Electronically Signed   By: Aram Candela M.D.   On: 10/29/2023 03:16    PROCEDURES and INTERVENTIONS:  Procedures  Medications - No data to display   IMPRESSION / MDM / ASSESSMENT AND PLAN / ED COURSE  I reviewed the triage vital signs and the nursing notes.  Differential diagnosis includes, but is not limited to, sprain, strain, fracture, dislocation  Patient presents with atraumatic right ankle pain, likely a soft tissue sprain or strain, suitable for outpatient management.  Ambulatory without antalgic gait, no deformity or acute trauma.  X-rays reassuring.  Discussed nonnarcotic multimodal analgesia, RICE therapies and expectant management.  Suitable for outpatient management.      FINAL CLINICAL IMPRESSION(S) / ED DIAGNOSES   Final diagnoses:  Acute left ankle pain  Sprain  of left ankle, unspecified ligament, initial encounter     Rx / DC Orders   ED Discharge Orders     None        Note:  This document was prepared using Dragon voice recognition software and may include unintentional dictation errors.   Delton Prairie, MD 10/29/23 [phone]

## 2023-10-29 NOTE — Discharge Instructions (Signed)
Please take Tylenol and ibuprofen/Advil for your pain.  It is safe to take them together, or to alternate them every few hours.  Take up to 1000mg of Tylenol at a time, up to 4 times per day.  Do not take more than 4000 mg of Tylenol in 24 hours.  For ibuprofen, take 400-600 mg, 3 - 4 times per day.  

## 2023-10-31 ENCOUNTER — Ambulatory Visit
Admission: RE | Admit: 2023-10-31 | Discharge: 2023-10-31 | Disposition: A | Discharge status: Home / Self Care | Payer: Medicaid Other | Source: Ambulatory Visit | Attending: Emergency Medicine | Admitting: Emergency Medicine

## 2023-10-31 VITALS — BP 114/79 | HR 100 | Temp 98.2°F | Resp 18

## 2023-10-31 DIAGNOSIS — M25571 Pain in right ankle and joints of right foot: Secondary | ICD-10-CM

## 2023-10-31 NOTE — ED Triage Notes (Addendum)
Patient to Urgent Care with complaints of right sided foot pain. Pain started in her right knee. Gradual onset. Denies any known injury. Now predominantly having pain in the bottom of her heel around to the top of her foot. Describes constant aching/ throbbing pain. Pain significantly worse with ambulation and when she first wakes up. Reports she is on her feet a lot at work.   Patient was evaluated at [facility] ER w/ negative work up 11/9.  Has been taking ibuprofen 600mg / biofreeze/ icing.

## 2023-10-31 NOTE — Discharge Instructions (Addendum)
Wear the walking boot as directed.  Rest and elevate your foot and ankle.  Apply ice packs as directed.  Take Tylenol or ibuprofen as needed for discomfort.  Follow-up with an orthopedist such as the one listed below.

## 2023-10-31 NOTE — ED Provider Notes (Signed)
Renaldo Fiddler    CSN: [id number] Arrival date & time: 10/31/23  1453      History   Chief Complaint Chief Complaint  Patient presents with   Ankle Pain    Went to er, pray didn't show anything.   Pain has been a 10 with pain meds and therapy - Entered by patient    HPI Christie Ali is a 37 y.o. female.  Patient presents with 4-day history of pain in her right foot and ankle.  No falls or injury.  Pain is worse with weightbearing and ambulation.  No numbness, weakness, bruising, redness, wounds, or other symptoms.  Treating with ibuprofen.  Patient was seen at [facility] ED on 10/29/2023; diagnosed with atraumatic right ankle pain; x-ray showed soft tissue swelling without acute bony abnormality; treated symptomatically.  The history is provided by the patient and medical records.    Past Medical History:  Diagnosis Date   Asthma    as a child   Pregnancy induced hypertension    Labor induced @ 40 wks    Patient Active Problem List   Diagnosis Date Noted   HSV-2 infection 05/30/2018   Asthma 11/30/2017    Past Surgical History:  Procedure Laterality Date   NO PAST SURGERIES      OB History     Gravida  4   Para  3   Term  3   Preterm  0   AB  1   Living  3      SAB  1   IAB  0   Ectopic  0   Multiple  0   Live Births  3            Home Medications    Prior to Admission medications   Medication Sig Start Date End Date Taking? Authorizing Provider  ibuprofen (ADVIL) 600 MG tablet Take 1 tablet (600 mg total) by mouth every 6 (six) hours as needed. 11/09/22   LampteyBritta Mccreedy, MD  Levonorgestrel (LILETTA, 52 MG, IU) by Intrauterine route.    [provider]  Prenatal Vit-Fe Fumarate-FA (MULTIVITAMIN-PRENATAL) 27-0.8 MG TABS tablet Take 1 tablet by mouth daily at 12 noon. 10/21/20   Nugent, Odie Sera, NP  SUMAtriptan (IMITREX) 50 MG tablet Take 1 tablet (50 mg total) by mouth once for 1 dose. May repeat in 2 hours if  headache persists or recurs. 11/09/22 11/09/22  LampteyBritta Mccreedy, MD    Family History Family History  Problem Relation Age of Onset   Hypertension Mother    Hypertension Father    Cancer Maternal Grandmother    Cancer Paternal Grandmother     Social History Social History   Tobacco Use   Smoking status: Every Day    Current packs/day: 0.50    Types: Cigarettes   Smokeless tobacco: Never  Vaping Use   Vaping status: Never Used  Substance Use Topics   Alcohol use: No   Drug use: Yes    Types: Marijuana    Comment: current use - last 2 days ago     Allergies   Sulfa antibiotics and Other   Review of Systems Review of Systems  Constitutional:  Negative for chills and fever.  Musculoskeletal:  Positive for arthralgias. Negative for gait problem and joint swelling.  Skin:  Negative for color change, rash and wound.  Neurological:  Negative for weakness and numbness.     Physical Exam Triage Vital Signs ED Triage Vitals  Encounter Vitals  Group     BP      Systolic BP Percentile      Diastolic BP Percentile      Pulse      Resp      Temp      Temp src      SpO2      Weight      Height      Head Circumference      Peak Flow      Pain Score      Pain Loc      Pain Education      Exclude from Growth Chart    No data found.  Updated Vital Signs BP 114/79   Pulse 100   Temp 98.2 F (36.8 C)   Resp 18   SpO2 96%   Visual Acuity Right Eye Distance:   Left Eye Distance:   Bilateral Distance:    Right Eye Near:   Left Eye Near:    Bilateral Near:     Physical Exam Constitutional:      General: She is not in acute distress. HENT:     Mouth/Throat:     Mouth: Mucous membranes are moist.  Cardiovascular:     Rate and Rhythm: Normal rate and regular rhythm.  Pulmonary:     Effort: Pulmonary effort is normal. No respiratory distress.  Musculoskeletal:        General: Tenderness present. No swelling or deformity. Normal range of motion.        Feet:  Skin:    General: Skin is warm and dry.     Findings: No bruising, erythema, lesion or rash.  Neurological:     General: No focal deficit present.     Mental Status: She is alert and oriented to person, place, and time.     Sensory: No sensory deficit.     Motor: No weakness.     Gait: Gait normal.  Psychiatric:        Mood and Affect: Mood normal.        Behavior: Behavior normal.      UC Treatments / Results  Labs (all labs ordered are listed, but only abnormal results are displayed) Labs Reviewed - No data to display  EKG   Radiology No results found.  Procedures Procedures (including critical care time)  Medications Ordered in UC Medications - No data to display  Initial Impression / Assessment and Plan / UC Course  I have reviewed the triage vital signs and the nursing notes.  Pertinent labs & imaging results that were available during my care of the patient were reviewed by me and considered in my medical decision making (see chart for details).   Right ankle and foot pain.  Patient was seen in the ED 2 days ago and had x-ray done there.  Treating today with walking boot.  Discussed rest, elevation, ice packs, Tylenol or ibuprofen.  Instructed patient to follow-up with an orthopedist.  Contact information for on-call Ortho provided.  She agrees to plan of care.    Final Clinical Impressions(s) / UC Diagnoses   Final diagnoses:  Pain in joint involving right ankle and foot     Discharge Instructions      Wear the walking boot as directed.  Rest and elevate your foot and ankle.  Apply ice packs as directed.  Take Tylenol or ibuprofen as needed for discomfort.  Follow-up with an orthopedist such as the one listed below.  ED Prescriptions   None    PDMP not reviewed this encounter.   Mickie Bail, NP 10/31/23 [phone]

## 2023-11-03 DIAGNOSIS — M25561 Pain in right knee: Secondary | ICD-10-CM | POA: Diagnosis not present

## 2023-11-03 DIAGNOSIS — M25571 Pain in right ankle and joints of right foot: Secondary | ICD-10-CM | POA: Diagnosis not present

## 2023-12-22 ENCOUNTER — Ambulatory Visit: Payer: Medicaid Other | Admitting: Nurse Practitioner

## 2023-12-22 ENCOUNTER — Encounter: Payer: Self-pay | Admitting: Nurse Practitioner

## 2023-12-22 VITALS — BP 110/74 | HR 69 | Temp 98.2°F | Ht 67.0 in | Wt 182.4 lb

## 2023-12-22 DIAGNOSIS — E663 Overweight: Secondary | ICD-10-CM | POA: Diagnosis not present

## 2023-12-22 DIAGNOSIS — Z13220 Encounter for screening for lipoid disorders: Secondary | ICD-10-CM

## 2023-12-22 DIAGNOSIS — Z0000 Encounter for general adult medical examination without abnormal findings: Secondary | ICD-10-CM | POA: Diagnosis not present

## 2023-12-22 DIAGNOSIS — Z131 Encounter for screening for diabetes mellitus: Secondary | ICD-10-CM | POA: Diagnosis not present

## 2023-12-22 DIAGNOSIS — Z720 Tobacco use: Secondary | ICD-10-CM

## 2023-12-22 DIAGNOSIS — Z3200 Encounter for pregnancy test, result unknown: Secondary | ICD-10-CM

## 2023-12-22 DIAGNOSIS — J4520 Mild intermittent asthma, uncomplicated: Secondary | ICD-10-CM

## 2023-12-22 LAB — LIPID PANEL
Cholesterol: 181 mg/dL (ref 0–200)
HDL: 35.3 mg/dL — ABNORMAL LOW (ref >39.00)
LDL Cholesterol: 132 mg/dL — ABNORMAL HIGH (ref 0–99)
NonHDL: 145.4
Total CHOL/HDL Ratio: 5
Triglycerides: 68 mg/dL (ref 0.0–149.0)
VLDL: 13.6 mg/dL (ref 0.0–40.0)

## 2023-12-22 LAB — COMPREHENSIVE METABOLIC PANEL
ALT: 6 U/L (ref 0–35)
AST: 14 U/L (ref 0–37)
Albumin: 4.3 g/dL (ref 3.5–5.2)
Alkaline Phosphatase: 60 U/L (ref 39–117)
BUN: 14 mg/dL (ref 6–23)
CO2: 24 meq/L (ref 19–32)
Calcium: 8.9 mg/dL (ref 8.4–10.5)
Chloride: 108 meq/L (ref 96–112)
Creatinine, Ser: 0.99 mg/dL (ref 0.40–1.20)
GFR: 73.34 mL/min (ref >60.00)
Glucose, Bld: 86 mg/dL (ref 70–99)
Potassium: 3.9 meq/L (ref 3.5–5.1)
Sodium: 140 meq/L (ref 135–145)
Total Bilirubin: 1.4 mg/dL — ABNORMAL HIGH (ref 0.2–1.2)
Total Protein: 6.6 g/dL (ref 6.0–8.3)

## 2023-12-22 LAB — CBC
HCT: 42.6 % (ref 36.0–46.0)
Hemoglobin: 14.4 g/dL (ref 12.0–15.0)
MCHC: 33.8 g/dL (ref 30.0–36.0)
MCV: 94.9 fL (ref 78.0–100.0)
Platelets: 220 10*3/uL (ref 150.0–400.0)
RBC: 4.49 Mil/uL (ref 3.87–5.11)
RDW: 13 % (ref 11.5–15.5)
WBC: 9.3 10*3/uL (ref 4.0–10.5)

## 2023-12-22 LAB — URINALYSIS, MICROSCOPIC ONLY

## 2023-12-22 LAB — POCT URINE PREGNANCY: Preg Test, Ur: NEGATIVE

## 2023-12-22 LAB — HEMOGLOBIN A1C: Hgb A1c MFr Bld: 5.6 % (ref 4.6–6.5)

## 2023-12-22 LAB — TSH: TSH: 1.02 u[IU]/mL (ref 0.35–5.50)

## 2023-12-22 NOTE — Assessment & Plan Note (Signed)
 Childhood diagnosis.  Patient does not have an inhaler nor does she have any PND.  Stable at this juncture

## 2023-12-22 NOTE — Progress Notes (Signed)
 New Patient Office Visit  Subjective    Patient ID: Christie Ali, female    DOB: August 14, 1986  Age: 38 y.o. MRN: [id number]  CC:  Chief Complaint  Patient presents with   Establish Care    Physical and labs. Pt would like Pregnancy test.     HPI Christie Ali presents to establish care   for complete physical and follow up of chronic conditions.   Asthma: as a kid. No trouble as an adult. States that she does not have PND.   Lactating: Patient states that she breast-fed her youngest child for approximately 8 months and has stopped since.  States that she is still squeezing it milk out of bilateral breast.  No breast pain or breast swelling or nipple changes.  Patient like to be pregnancy test today  Immunizations: -Tetanus: Completed in 2022 -Influenza: refused -Shingles: Too young -Pneumonia: Too young _HPV: thinks up to date per patient report  Diet: Fair diet. She will eat breakfast for sure, snack for lunch and then dinner. She will drink tea mostly. Working on the water intake  Exercise: No regular exercise. Playing with the kids. Playing ball or walking   Eye exam: needs updating  Dental exam: Completes semi-annually    Colonoscopy: Too young, currently average risk Lung Cancer Screening: Does not qualify  Pap smear: 2020 was negative cells and negative for HPV. Amb referral to GYN  Mammogram: Too young, currently average risk  Dexa: Too young  Sleep: she works third shift. States that she will get 3-4 hours of sleep. Lays down 9-10am and gets up 1-2 for the kids     Outpatient Encounter Medications as of 12/22/2023  Medication Sig   diclofenac (VOLTAREN) 75 MG EC tablet Take 75 mg by mouth 2 (two) times daily.   ibuprofen  (ADVIL ) 600 MG tablet Take 1 tablet (600 mg total) by mouth every 6 (six) hours as needed.   Levonorgestrel  (LILETTA , 52 MG, IU) by Intrauterine route.   [DISCONTINUED] Prenatal Vit-Fe Fumarate-FA (MULTIVITAMIN-PRENATAL) 27-0.8 MG  TABS tablet Take 1 tablet by mouth daily at 12 noon.   [DISCONTINUED] SUMAtriptan  (IMITREX ) 50 MG tablet Take 1 tablet (50 mg total) by mouth once for 1 dose. May repeat in 2 hours if headache persists or recurs.   No facility-administered encounter medications on file as of 12/22/2023.    Past Medical History:  Diagnosis Date   Asthma    as a child   Pregnancy induced hypertension    Labor induced @ 40 wks    Past Surgical History:  Procedure Laterality Date   NO PAST SURGERIES      Family History  Problem Relation Age of Onset   Hypertension Mother    Hypertension Father    Cancer Maternal Grandmother        pancreatic   Cancer Paternal Grandmother     Social History   Socioeconomic History   Marital status: Single    Spouse name: Not on file   Number of children: 3   Years of education: Not on file   Highest education level: 12th grade  Occupational History    Comment: unemployed  Tobacco Use   Smoking status: Every Day    Current packs/day: 1.00    Average packs/day: 1 pack/day for 18.0 years (18.0 ttl pk-yrs)    Types: Cigarettes    Start date: 12/21/2005   Smokeless tobacco: Never  Vaping Use   Vaping status: Never Used  Substance and Sexual Activity  Alcohol use: No    Comment: rarely   Drug use: Yes    Types: Marijuana    Comment: current use - last 2 days ago   Sexual activity: Yes    Birth control/protection: None  Other Topics Concern   Not on file  Social History Narrative   Fulltime: PCA      Ka'den (5)   Toney(4)   Tre'jan (2)   Social Drivers of Corporate Investment Banker Strain: Low Risk  (12/19/2023)   Overall Financial Resource Strain (CARDIA)    Difficulty of Paying Living Expenses: Not hard at all  Food Insecurity: No Food Insecurity (12/19/2023)   Hunger Vital Sign    Worried About Running Out of Food in the Last Year: Never true    Ran Out of Food in the Last Year: Never true  Transportation Needs: No Transportation Needs  (12/19/2023)   PRAPARE - Administrator, Civil Service (Medical): No    Lack of Transportation (Non-Medical): No  Physical Activity: Insufficiently Active (12/19/2023)   Exercise Vital Sign    Days of Exercise per Week: 7 days    Minutes of Exercise per Session: 10 min  Stress: No Stress Concern Present (12/19/2023)   [employer] of Occupational Health - Occupational Stress Questionnaire    Feeling of Stress : Not at all  Social Connections: Moderately Isolated (12/19/2023)   Social Connection and Isolation Panel [NHANES]    Frequency of Communication with Friends and Family: More than three times a week    Frequency of Social Gatherings with Friends and Family: More than three times a week    Attends Religious Services: More than 4 times per year    Active Member of [employer] or Organizations: No    Attends Engineer, Structural: Not on file    Marital Status: Never married  Intimate Partner Violence: Not At Risk (03/09/2019)   Humiliation, Afraid, Rape, and Kick questionnaire    Fear of Current or Ex-Partner: No    Emotionally Abused: No    Physically Abused: No    Sexually Abused: No    Review of Systems  Constitutional:  Negative for chills and fever.  Respiratory:  Negative for shortness of breath.   Cardiovascular:  Negative for chest pain and leg swelling.  Gastrointestinal:  Negative for abdominal pain, blood in stool, constipation, diarrhea, nausea and vomiting.       Bm daily   Genitourinary:  Negative for dysuria and hematuria.  Neurological:  Negative for tingling and headaches.  Psychiatric/Behavioral:  Negative for hallucinations and suicidal ideas.         Objective    BP 110/74   Pulse 69   Temp 98.2 F (36.8 C) (Oral)   Ht 5' 7 (1.702 m)   Wt 182 lb 6.4 oz (82.7 kg)   SpO2 99%   BMI 28.57 kg/m   Physical Exam Vitals and nursing note reviewed.  Constitutional:      Appearance: Normal appearance.  HENT:     Right Ear:  Tympanic membrane, ear canal and external ear normal.     Left Ear: Tympanic membrane, ear canal and external ear normal.     Mouth/Throat:     Mouth: Mucous membranes are moist.     Pharynx: Oropharynx is clear.  Eyes:     Extraocular Movements: Extraocular movements intact.     Pupils: Pupils are equal, round, and reactive to light.  Cardiovascular:     Rate and  Rhythm: Normal rate and regular rhythm.     Pulses: Normal pulses.     Heart sounds: Normal heart sounds.  Pulmonary:     Effort: Pulmonary effort is normal.     Breath sounds: Normal breath sounds.  Abdominal:     General: Bowel sounds are normal. There is no distension.     Palpations: There is no mass.     Tenderness: There is no abdominal tenderness.     Hernia: No hernia is present.  Musculoskeletal:     Right lower leg: No edema.     Left lower leg: No edema.  Lymphadenopathy:     Cervical: No cervical adenopathy.  Skin:    General: Skin is warm.       Neurological:     General: No focal deficit present.     Mental Status: She is alert.     Deep Tendon Reflexes:     Reflex Scores:      Bicep reflexes are 2+ on the right side and 2+ on the left side.      Patellar reflexes are 2+ on the right side and 2+ on the left side.    Comments: Bilateral upper and lower extremity strength 5/5  Psychiatric:        Mood and Affect: Mood normal.        Behavior: Behavior normal.        Thought Content: Thought content normal.        Judgment: Judgment normal.         Assessment & Plan:   Problem List Items Addressed This Visit       Respiratory   Asthma   Childhood diagnosis.  Patient does not have an inhaler nor does she have any PND.  Stable at this juncture        Other   Preventative health care - Primary   Discussed age-appropriate immunizations and screening exams.  Did review patient's personal, surgical, social, family histories.  Patient up-to-date with all age-appropriate vaccinations she would  like.  Patient is too young for CRC screening or breast cancer screening.  Patient is due for cervical cancer screening and will be referred to GYN for management of that and IUD.  She can also discuss the lactation piece with them.  Patient was given information at discharge about preventative healthcare maintenance with anticipatory guidance      Relevant Orders   CBC   Comprehensive metabolic panel   TSH   Ambulatory referral to Gynecology   Overweight   Tobacco abuse   Relevant Orders   Urine Microscopic   Possible pregnancy   Patient was concerned of being pregnant.  She does have IUD in place but has been lactating since she stopped breast-feeding her child over a year ago.  Ambulatory referral to GYN.  Pregnancy test negative in office      Relevant Orders   POCT urine pregnancy (Completed)   Other Visit Diagnoses       Screening for diabetes mellitus       Relevant Orders   Hemoglobin A1c     Screening for lipid disorders       Relevant Orders   Lipid panel       Return in about 1 year (around 12/21/2024) for CPE and Labs.   Adina Crandall, NP

## 2023-12-22 NOTE — Assessment & Plan Note (Signed)
 Discussed age-appropriate immunizations and screening exams.  Did review patient's personal, surgical, social, family histories.  Patient up-to-date with all age-appropriate vaccinations she would like.  Patient is too young for CRC screening or breast cancer screening.  Patient is due for cervical cancer screening and will be referred to GYN for management of that and IUD.  She can also discuss the lactation piece with them.  Patient was given information at discharge about preventative healthcare maintenance with anticipatory guidance

## 2023-12-22 NOTE — Patient Instructions (Signed)
 Nice to see you today I will be in touch with the labs once I have reviewed them Follow up with me in 1 year, sooner if you need me  I have referred you to GYN

## 2023-12-22 NOTE — Assessment & Plan Note (Signed)
 Patient was concerned of being pregnant.  She does have IUD in place but has been lactating since she stopped breast-feeding her child over a year ago.  Ambulatory referral to GYN.  Pregnancy test negative in office

## 2023-12-25 ENCOUNTER — Encounter: Payer: Self-pay | Admitting: Nurse Practitioner

## 2023-12-26 ENCOUNTER — Telehealth: Payer: Self-pay | Admitting: *Deleted

## 2023-12-26 NOTE — Telephone Encounter (Signed)
 Copied from CRM [id number]. Topic: Clinical - Lab/Test Results >> Dec 26, 2023  3:14 PM Joanell B wrote: Reason for CRM: Pt stated that she received a message regarding some lab results on her MyChart and would like to discuss some concerns and ask some questions.

## 2023-12-27 ENCOUNTER — Encounter: Payer: Self-pay | Admitting: Nurse Practitioner

## 2023-12-27 NOTE — Telephone Encounter (Signed)
 Left detailed voicemail for patient to call the office back.

## 2023-12-29 NOTE — Telephone Encounter (Signed)
 Pt has messaged provider via Tool and her questions have been answered by provider thru mychart.  Response to bilirubin question was read Last read by Clydene Laming at  3:28 PM on 12/27/2023.

## 2024-01-09 ENCOUNTER — Ambulatory Visit: Payer: Medicaid Other | Admitting: Nurse Practitioner

## 2024-04-09 ENCOUNTER — Ambulatory Visit (INDEPENDENT_AMBULATORY_CARE_PROVIDER_SITE_OTHER): Admitting: Obstetrics and Gynecology

## 2024-04-09 ENCOUNTER — Other Ambulatory Visit (HOSPITAL_COMMUNITY)
Admission: RE | Admit: 2024-04-09 | Discharge: 2024-04-09 | Disposition: A | Discharge status: Home / Self Care | Source: Ambulatory Visit | Attending: Obstetrics and Gynecology | Admitting: Obstetrics and Gynecology

## 2024-04-09 ENCOUNTER — Encounter: Payer: Self-pay | Admitting: Obstetrics and Gynecology

## 2024-04-09 VITALS — BP 121/75 | HR 80 | Wt 178.0 lb

## 2024-04-09 DIAGNOSIS — B3731 Acute candidiasis of vulva and vagina: Secondary | ICD-10-CM

## 2024-04-09 DIAGNOSIS — Z1339 Encounter for screening examination for other mental health and behavioral disorders: Secondary | ICD-10-CM | POA: Diagnosis not present

## 2024-04-09 DIAGNOSIS — Z01419 Encounter for gynecological examination (general) (routine) without abnormal findings: Secondary | ICD-10-CM | POA: Insufficient documentation

## 2024-04-09 DIAGNOSIS — Z124 Encounter for screening for malignant neoplasm of cervix: Secondary | ICD-10-CM | POA: Diagnosis not present

## 2024-04-09 DIAGNOSIS — N760 Acute vaginitis: Secondary | ICD-10-CM | POA: Diagnosis not present

## 2024-04-09 DIAGNOSIS — T839XXA Unspecified complication of genitourinary prosthetic device, implant and graft, initial encounter: Secondary | ICD-10-CM | POA: Diagnosis not present

## 2024-04-09 DIAGNOSIS — B9689 Other specified bacterial agents as the cause of diseases classified elsewhere: Secondary | ICD-10-CM | POA: Diagnosis not present

## 2024-04-09 MED ORDER — FLUCONAZOLE 150 MG PO TABS: 150.0000 mg | ORAL_TABLET | Freq: Once | ORAL | 0 refills | Status: AC

## 2024-04-09 MED ORDER — METRONIDAZOLE 500 MG PO TABS: 500.0000 mg | ORAL_TABLET | Freq: Two times a day (BID) | ORAL | 0 refills | Status: DC

## 2024-04-09 NOTE — Progress Notes (Signed)
 Patient presents for Annual.  LMP: none Last pap:  2020 Contraception: IUD:   Mammogram: Not yet indicated MGM, PGM had Breast Cancer STD Screening: Accepts full panel   CC:  pt feels IUD strings are too long pt considering depo.

## 2024-04-09 NOTE — Progress Notes (Signed)
 Obstetrics and Gynecology New Patient Evaluation  Appointment Date: 04/09/2024  OB[facility]N Clinic: [facility]   Primary Care Provider: Dorothe Gaster  Chief Complaint:  Chief Complaint  Patient presents with   Gynecologic Exam    History of Present Illness: Christie Ali is a 38 y.o.  [id number], seen for the above chief complaint. Her past medical history is significant for nothing   Patient doing well. Only issue is partner feels strings. Patient consider depo provera  instead. Liletta  placed 08/2021, immediately PP. She had the strings trimmed at her regular PP visit. Patient denies any issues, for her, with the IUD and likes it due to lack of periods.   Review of Systems: Pertinent items are noted in HPI.   Past Medical History:  Past Medical History:  Diagnosis Date   Asthma    as a child   Pregnancy induced hypertension    Labor induced @ 40 wks    Past Surgical History:  Past Surgical History:  Procedure Laterality Date   NO PAST SURGERIES      Past Obstetrical History:  OB History  Gravida Para Term Preterm AB Living  4 3 3  0 1 3  SAB IAB Ectopic Multiple Live Births  1 0 0 0 3    # Outcome Date GA Lbr Len/2nd Weight Sex Type Anes PTL Lv  4 Term 08/23/21 [redacted]w[redacted]d 02:15 / 00:38 8 lb 6.6 oz (3.816 kg) M Vag-Spont EPI  LIV  3 SAB 11/11/20          2 Term 08/25/19 [redacted]w[redacted]d 03:30 / 00:29 7 lb 12.3 oz (3.524 kg) M Vag-Spont EPI  LIV     Birth Comments: WNL  1 Term 06/22/18 [redacted]w[redacted]d 06:11 / 01:15 7 lb 10.4 oz (3.47 kg) F Vag-Spont EPI  LIV     Birth Comments: extra finger on right hand   Past Gynecological History: As per HPI. Periods: rare and if so, spotting History of Pap Smear(s): Yes.   Last pap 2022, which was wnl She is currently using  Liletta  placed 08/2021  for contraception.  Social History:  Social History   Socioeconomic History   Marital status: Single    Spouse name: Not on file   Number of children: 3   Years of education:  Not on file   Highest education level: 12th grade  Occupational History    Comment: unemployed  Tobacco Use   Smoking status: Every Day    Current packs/day: 1.00    Average packs/day: 1 pack/day for 18.3 years (18.3 ttl pk-yrs)    Types: Cigarettes    Start date: 12/21/2005   Smokeless tobacco: Never  Vaping Use   Vaping status: Never Used  Substance and Sexual Activity   Alcohol use: No    Comment: rarely   Drug use: Yes    Types: Marijuana   Sexual activity: Yes    Partners: Male    Birth control/protection: I.U.D.  Other Topics Concern   Not on file  Social History Narrative   Fulltime: PCA      Ka'den (5)   Toney(4)   Tre'jan (2)   Social Drivers of Corporate investment banker Strain: Low Risk  (12/19/2023)   Overall Financial Resource Strain (CARDIA)    Difficulty of Paying Living Expenses: Not hard at all  Food Insecurity: No Food Insecurity (12/19/2023)   Hunger Vital Sign    Worried About Running Out of Food in the Last Year: Never true  Ran Out of Food in the Last Year: Never true  Transportation Needs: No Transportation Needs (12/19/2023)   PRAPARE - Administrator, Civil Service (Medical): No    Lack of Transportation (Non-Medical): No  Physical Activity: Insufficiently Active (12/19/2023)   Exercise Vital Sign    Days of Exercise per Week: 7 days    Minutes of Exercise per Session: 10 min  Stress: No Stress Concern Present (12/19/2023)   [employer] of Occupational Health - Occupational Stress Questionnaire    Feeling of Stress : Not at all  Social Connections: Moderately Isolated (12/19/2023)   Social Connection and Isolation Panel [NHANES]    Frequency of Communication with Friends and Family: More than three times a week    Frequency of Social Gatherings with Friends and Family: More than three times a week    Attends Religious Services: More than 4 times per year    Active Member of [employer] or Organizations: No    Attends Museum/gallery exhibitions officer: Not on file    Marital Status: Never married  Intimate Partner Violence: Not At Risk (03/09/2019)   Humiliation, Afraid, Rape, and Kick questionnaire    Fear of Current or Ex-Partner: No    Emotionally Abused: No    Physically Abused: No    Sexually Abused: No    Family History:  Family History  Problem Relation Age of Onset   Hypertension Mother    Hypertension Father    Cancer Maternal Grandmother        pancreatic   Cancer Paternal Grandmother     Medications Christie Ali had no medications administered during this visit. Current Outpatient Medications  Medication Sig Dispense Refill   Levonorgestrel  (LILETTA , 52 MG, IU) by Intrauterine route.     No current facility-administered medications for this visit.    Allergies Sulfa antibiotics and Other   Physical Exam:  BP 121/75   Pulse 80   Wt 178 lb (80.7 kg)   BMI 27.88 kg/m  Body mass index is 27.88 kg/m. General appearance: Well nourished, well developed female in no acute distress.  Neck:  Supple, normal appearance, and no thyromegaly  Cardiovascular: normal s1 and s2.  No murmurs, rubs or gallops. Respiratory:  Clear to auscultation bilateral. Normal respiratory effort Abdomen: positive bowel sounds and no masses, hernias; diffusely non tender to palpation, non distended Breasts: breasts appear normal, no suspicious masses, no skin or nipple changes or axillary nodes, and normal palpation. Neuro/Psych:  Normal mood and affect.  Skin:  Warm and dry.  Lymphatic:  No inguinal lymphadenopathy.   Cervical exam performed in the presence of a chaperone Pelvic exam: is not limited by body habitus EGBUS: within normal limits Vagina: white, clumpy malodorous d/c Cervix: normal appearing cervix without tenderness, discharge or lesions. IUD strings and when you unfurl them, the would come out of the introitus about 5-65mm. Strings cut and unable to see anymore.  Uterus:  nonenlarged and non  tender Adnexa:  normal adnexa and no mass, fullness, tenderness Rectovaginal: deferred  Laboratory: none  Radiology: none  Assessment: patient doing well  Plan:  1. Well woman exam with routine gynecological exam (Primary) Routine care - Cytology - PAP - RPR+HBsAg+HCVAb+...  2. Cervical cancer screening  3. Complication of intrauterine device (IUD), unspecified complication, initial encounter (HCC) D/w her re: options. Pt elects for cutting strings short knowing that it may be a little more difficult to remove in the future.   RTC PRN  Tyler Gallant MD Attending [facility])

## 2024-04-10 LAB — RPR+HBSAG+HCVAB+...
HIV Screen 4th Generation wRfx: NONREACTIVE
Hep C Virus Ab: NONREACTIVE
Hepatitis B Surface Ag: NEGATIVE
RPR Ser Ql: NONREACTIVE

## 2024-04-11 LAB — CYTOLOGY - PAP
Chlamydia: NEGATIVE
Comment: NEGATIVE
Comment: NEGATIVE
Comment: NEGATIVE
Comment: NORMAL
Diagnosis: NEGATIVE
High risk HPV: NEGATIVE
Neisseria Gonorrhea: NEGATIVE
Trichomonas: POSITIVE — AB

## 2024-04-14 ENCOUNTER — Encounter: Payer: Self-pay | Admitting: Obstetrics and Gynecology

## 2024-05-17 ENCOUNTER — Ambulatory Visit: Admitting: Obstetrics and Gynecology

## 2024-05-17 ENCOUNTER — Other Ambulatory Visit (HOSPITAL_COMMUNITY)
Admission: RE | Admit: 2024-05-17 | Discharge: 2024-05-17 | Disposition: A | Discharge status: Home / Self Care | Source: Ambulatory Visit | Attending: Obstetrics and Gynecology | Admitting: Obstetrics and Gynecology

## 2024-05-17 ENCOUNTER — Ambulatory Visit (INDEPENDENT_AMBULATORY_CARE_PROVIDER_SITE_OTHER)

## 2024-05-17 DIAGNOSIS — Z113 Encounter for screening for infections with a predominantly sexual mode of transmission: Secondary | ICD-10-CM | POA: Diagnosis not present

## 2024-05-17 NOTE — Progress Notes (Signed)
 SUBJECTIVE:  38 y.o. female who desires a STI screen. Denies abnormal vaginal discharge, bleeding or significant pelvic pain. No UTI symptoms. Denies history of known exposure to STD.  No LMP recorded. (Menstrual status: IUD).  OBJECTIVE:  She appears well.   ASSESSMENT:  STI Screen   PLAN:  Pt offered STI blood screening-not indicated GC, chlamydia, and trichomonas probe sent to lab.  Treatment: To be determined once lab results are received.  Pt follow up as needed.

## 2024-05-21 LAB — CERVICOVAGINAL ANCILLARY ONLY
Bacterial Vaginitis (gardnerella): POSITIVE — AB
Candida Glabrata: NEGATIVE
Candida Vaginitis: NEGATIVE
Chlamydia: NEGATIVE
Comment: NEGATIVE
Comment: NEGATIVE
Comment: NEGATIVE
Comment: NEGATIVE
Comment: NEGATIVE
Comment: NORMAL
Neisseria Gonorrhea: NEGATIVE
Trichomonas: NEGATIVE

## 2024-05-23 ENCOUNTER — Other Ambulatory Visit: Payer: Self-pay

## 2024-05-23 DIAGNOSIS — B9689 Other specified bacterial agents as the cause of diseases classified elsewhere: Secondary | ICD-10-CM

## 2024-05-23 MED ORDER — METRONIDAZOLE 500 MG PO TABS: 500.0000 mg | ORAL_TABLET | Freq: Two times a day (BID) | ORAL | 0 refills | Status: DC

## 2024-05-23 NOTE — Progress Notes (Signed)
 Pt sent message requesting Rx for BV be sent.

## 2024-05-24 ENCOUNTER — Ambulatory Visit: Payer: Self-pay | Admitting: Obstetrics and Gynecology

## 2024-05-24 DIAGNOSIS — B9689 Other specified bacterial agents as the cause of diseases classified elsewhere: Secondary | ICD-10-CM

## 2024-05-24 MED ORDER — METRONIDAZOLE 500 MG PO TABS: 500.0000 mg | ORAL_TABLET | Freq: Two times a day (BID) | ORAL | 0 refills | Status: DC

## 2024-07-31 ENCOUNTER — Ambulatory Visit: Payer: Self-pay

## 2024-08-01 ENCOUNTER — Ambulatory Visit (INDEPENDENT_AMBULATORY_CARE_PROVIDER_SITE_OTHER)

## 2024-08-01 ENCOUNTER — Other Ambulatory Visit (HOSPITAL_COMMUNITY)
Admission: RE | Admit: 2024-08-01 | Discharge: 2024-08-01 | Disposition: A | Discharge status: Home / Self Care | Source: Ambulatory Visit | Attending: Obstetrics & Gynecology | Admitting: Obstetrics & Gynecology

## 2024-08-01 DIAGNOSIS — N898 Other specified noninflammatory disorders of vagina: Secondary | ICD-10-CM | POA: Diagnosis not present

## 2024-08-01 MED ORDER — FLUCONAZOLE 150 MG PO TABS: 150.0000 mg | ORAL_TABLET | Freq: Once | ORAL | 0 refills | Status: AC

## 2024-08-01 NOTE — Progress Notes (Signed)
 SUBJECTIVE:  38 y.o. female who desires a BV/Yeast swab. Notes abnormal vaginal discharge, No bleeding or significant pelvic pain. No UTI symptoms. Denies history of known exposure to STD.  No LMP recorded. (Menstrual status: IUD).  OBJECTIVE:  She appears well.   ASSESSMENT:  Yeast/BV Screening   PLAN:  Pt offered STI blood screening-not indicated Declines GC, chlamydia, and trichomonas probe sent to lab.  Treatment: To be determined once lab results are received.  Pt follow up as needed.

## 2024-08-02 LAB — CERVICOVAGINAL ANCILLARY ONLY
Bacterial Vaginitis (gardnerella): POSITIVE — AB
Candida Glabrata: NEGATIVE
Candida Vaginitis: NEGATIVE
Comment: NEGATIVE
Comment: NEGATIVE
Comment: NEGATIVE

## 2024-08-06 ENCOUNTER — Ambulatory Visit: Payer: Self-pay | Admitting: Certified Nurse Midwife

## 2024-08-06 DIAGNOSIS — B9689 Other specified bacterial agents as the cause of diseases classified elsewhere: Secondary | ICD-10-CM

## 2024-08-06 MED ORDER — METRONIDAZOLE 500 MG PO TABS: 500.0000 mg | ORAL_TABLET | Freq: Two times a day (BID) | ORAL | 0 refills | Status: DC

## 2024-08-07 ENCOUNTER — Ambulatory Visit
Admission: RE | Admit: 2024-08-07 | Discharge: 2024-08-07 | Disposition: A | Discharge status: Home / Self Care | Source: Ambulatory Visit | Attending: Emergency Medicine | Admitting: Emergency Medicine

## 2024-08-07 ENCOUNTER — Ambulatory Visit
Admission: RE | Admit: 2024-08-07 | Discharge: 2024-08-07 | Disposition: A | Discharge status: Home / Self Care | Payer: Self-pay | Attending: Emergency Medicine | Admitting: Emergency Medicine

## 2024-08-07 ENCOUNTER — Ambulatory Visit
Admission: RE | Admit: 2024-08-07 | Discharge: 2024-08-07 | Disposition: A | Discharge status: Home / Self Care | Attending: Emergency Medicine | Admitting: Emergency Medicine

## 2024-08-07 VITALS — BP 106/68 | HR 72 | Temp 98.0°F | Resp 18

## 2024-08-07 DIAGNOSIS — M5441 Lumbago with sciatica, right side: Secondary | ICD-10-CM | POA: Insufficient documentation

## 2024-08-07 DIAGNOSIS — M5442 Lumbago with sciatica, left side: Secondary | ICD-10-CM | POA: Insufficient documentation

## 2024-08-07 DIAGNOSIS — M545 Low back pain, unspecified: Secondary | ICD-10-CM | POA: Diagnosis not present

## 2024-08-07 LAB — POCT URINE DIPSTICK
Bilirubin, UA: NEGATIVE
Blood, UA: NEGATIVE
Glucose, UA: NEGATIVE mg/dL
Ketones, POC UA: NEGATIVE mg/dL
Leukocytes, UA: NEGATIVE
Nitrite, UA: NEGATIVE
Spec Grav, UA: >=1.03 — AB (ref 1.010–1.025)
Urobilinogen, UA: 2 U/dL — AB
pH, UA: 6.5 (ref 5.0–8.0)

## 2024-08-07 LAB — POCT URINE PREGNANCY: Preg Test, Ur: NEGATIVE

## 2024-08-07 MED ORDER — METHOCARBAMOL 500 MG PO TABS: 500.0000 mg | ORAL_TABLET | Freq: Two times a day (BID) | ORAL | 0 refills | Status: AC | PRN

## 2024-08-07 MED ORDER — IBUPROFEN 600 MG PO TABS: 600.0000 mg | ORAL_TABLET | Freq: Four times a day (QID) | ORAL | 0 refills | Status: AC | PRN

## 2024-08-07 NOTE — ED Provider Notes (Signed)
 CAY RALPH PELT    CSN: [id number] Arrival date & time: 08/07/24  0800      History   Chief Complaint Chief Complaint  Patient presents with   Back Pain    Severe - Entered by patient    HPI Christie Ali is a 38 y.o. female.  Patient presents with 1 month history of bilateral low back pain which radiates to both legs.  No falls or trauma.  She does heavy lifting at work.  The pain is worse with sitting and bending.  It improves with walking and standing.  She denies numbness, weakness, saddle anesthesia, loss of bowel/bladder control, bruising, redness, lesions, fever, abdominal pain, dysuria, hematuria.  No OTC medications taken.  The history is provided by the patient and medical records.    Past Medical History:  Diagnosis Date   Asthma    as a child   Pregnancy induced hypertension    Labor induced @ 40 wks   Trichomoniasis     Patient Active Problem List   Diagnosis Date Noted   Preventative health care 12/22/2023   Overweight 12/22/2023   Tobacco abuse 12/22/2023   HSV-2 infection 05/30/2018   Asthma 11/30/2017    Past Surgical History:  Procedure Laterality Date   NO PAST SURGERIES      OB History     Gravida  4   Para  3   Term  3   Preterm  0   AB  1   Living  3      SAB  1   IAB  0   Ectopic  0   Multiple  0   Live Births  3            Home Medications    Prior to Admission medications   Medication Sig Start Date End Date Taking? Authorizing Provider  ibuprofen  (ADVIL ) 600 MG tablet Take 1 tablet (600 mg total) by mouth every 6 (six) hours as needed. 08/07/24  Yes Corlis Burnard DEL, NP  methocarbamol  (ROBAXIN ) 500 MG tablet Take 1 tablet (500 mg total) by mouth 2 (two) times daily as needed for muscle spasms. 08/07/24  Yes Corlis Burnard DEL, NP  Levonorgestrel  (LILETTA , 52 MG, IU) by Intrauterine route. Patient not taking: Reported on 05/17/2024    [provider]  metroNIDAZOLE  (FLAGYL ) 500 MG tablet Take 1  tablet (500 mg total) by mouth 2 (two) times daily. Patient not taking: Reported on 08/07/2024 05/24/24   Izell Harari, MD  metroNIDAZOLE  (FLAGYL ) 500 MG tablet Take 1 tablet (500 mg total) by mouth 2 (two) times daily. 08/06/24   Emilio Delilah HERO, CNM    Family History Family History  Problem Relation Age of Onset   Hypertension Mother    Hypertension Father    Cancer Maternal Grandmother        pancreatic   Cancer Paternal Grandmother     Social History Social History   Tobacco Use   Smoking status: Every Day    Current packs/day: 1.00    Average packs/day: 1 pack/day for 18.6 years (18.6 ttl pk-yrs)    Types: Cigarettes    Start date: 12/21/2005   Smokeless tobacco: Never  Vaping Use   Vaping status: Never Used  Substance Use Topics   Alcohol use: No    Comment: rarely   Drug use: Not Currently    Types: Marijuana     Allergies   Sulfa antibiotics and Other   Review of Systems Review of  Systems  Constitutional:  Negative for chills and fever.  Gastrointestinal:  Negative for abdominal pain.  Genitourinary:  Negative for dysuria, flank pain and hematuria.  Musculoskeletal:  Positive for back pain. Negative for gait problem.  Skin:  Negative for color change and rash.  Neurological:  Negative for weakness and numbness.     Physical Exam Triage Vital Signs ED Triage Vitals  Encounter Vitals Group     BP      Girls Systolic BP Percentile      Girls Diastolic BP Percentile      Boys Systolic BP Percentile      Boys Diastolic BP Percentile      Pulse      Resp      Temp      Temp src      SpO2      Weight      Height      Head Circumference      Peak Flow      Pain Score      Pain Loc      Pain Education      Exclude from Growth Chart    No data found.  Updated Vital Signs BP 106/68   Pulse 72   Temp 98 F (36.7 C)   Resp 18   SpO2 100%   Visual Acuity Right Eye Distance:   Left Eye Distance:   Bilateral Distance:    Right Eye  Near:   Left Eye Near:    Bilateral Near:     Physical Exam Constitutional:      General: She is not in acute distress. HENT:     Mouth/Throat:     Mouth: Mucous membranes are moist.  Cardiovascular:     Rate and Rhythm: Normal rate and regular rhythm.  Pulmonary:     Effort: Pulmonary effort is normal. No respiratory distress.  Abdominal:     General: Bowel sounds are normal.     Palpations: Abdomen is soft.     Tenderness: There is no abdominal tenderness. There is no right CVA tenderness, left CVA tenderness, guarding or rebound.  Musculoskeletal:        General: Tenderness present. No swelling or deformity. Normal range of motion.     Comments: Mild muscular tenderness across lower back.  Skin:    General: Skin is warm and dry.     Capillary Refill: Capillary refill takes less than 2 seconds.     Findings: No bruising, erythema, lesion or rash.  Neurological:     General: No focal deficit present.     Mental Status: She is alert and oriented to person, place, and time.     Sensory: No sensory deficit.     Motor: No weakness.     Gait: Gait normal.      UC Treatments / Results  Labs (all labs ordered are listed, but only abnormal results are displayed) Labs Reviewed  POCT URINE DIPSTICK - Abnormal; Notable for the following components:      Result Value   Color, UA straw (*)    Spec Grav, UA >=1.030 (*)    Protein Ur, POC trace (*)    Urobilinogen, UA 2.0 (*)    All other components within normal limits  POCT URINE PREGNANCY - Normal    EKG   Radiology DG Lumbar Spine Complete Result Date: 08/07/2024 CLINICAL DATA:  Low back pain. EXAM: LUMBAR SPINE - COMPLETE 4+ VIEW COMPARISON:  None Available. FINDINGS: There is  no evidence of lumbar spine fracture. Alignment is normal. Intervertebral disc spaces are maintained. IUD projects over the central pelvis. IMPRESSION: Negative. Electronically Signed   By: Camellia Candle M.D.   On: 08/07/2024 09:42     Procedures Procedures (including critical care time)  Medications Ordered in UC Medications - No data to display  Initial Impression / Assessment and Plan / UC Course  I have reviewed the triage vital signs and the nursing notes.  Pertinent labs & imaging results that were available during my care of the patient were reviewed by me and considered in my medical decision making (see chart for details).    Bilateral low back pain with bilateral sciatica.  X-ray of lumbar spine negative.  Urine does not indicate infection.  Urine pregnancy negative.  Treating with ibuprofen  and methocarbamol .  Precautions for drowsiness with methocarbamol  discussed.  Instructed patient to follow-up with her PCP or an orthopedist.  Contact information for on-call Ortho provided.  Education provided on sciatica.  She agrees to plan of care.  Final Clinical Impressions(s) / UC Diagnoses   Final diagnoses:  Bilateral low back pain with bilateral sciatica, unspecified chronicity     Discharge Instructions      Go to [facility] for your x-ray.    [address]  [address] [place], [place] [address] [phone]   Take ibuprofen  as needed for discomfort.  Take the muscle relaxer as needed for muscle spasm; Do not drive, operate machinery, or drink alcohol with this medication as it can cause drowsiness.   Follow up with your primary care provider or an orthopedist if your symptoms are not improving.         ED Prescriptions     Medication Sig Dispense Auth. Provider   ibuprofen  (ADVIL ) 600 MG tablet Take 1 tablet (600 mg total) by mouth every 6 (six) hours as needed. 30 tablet Corlis Burnard DEL, NP   methocarbamol  (ROBAXIN ) 500 MG tablet Take 1 tablet (500 mg total) by mouth 2 (two) times daily as needed for muscle spasms. 10 tablet Corlis Burnard DEL, NP      PDMP not reviewed this encounter.   Corlis Burnard DEL, NP 08/07/24 [phone]

## 2024-08-07 NOTE — ED Triage Notes (Signed)
 Patient to Urgent Care with complaints of lower back pain; pain radiates into bilateral lower legs. Worse when sitting and standing up.  Symptoms x1 month. Physically demanding job.

## 2024-08-07 NOTE — Discharge Instructions (Addendum)
 Go to [facility] for your x-ray.    [address]  [address] [place], [place] [address] [phone]   Take ibuprofen  as needed for discomfort.  Take the muscle relaxer as needed for muscle spasm; Do not drive, operate machinery, or drink alcohol with this medication as it can cause drowsiness.   Follow up with your primary care provider or an orthopedist if your symptoms are not improving.

## 2024-12-18 ENCOUNTER — Other Ambulatory Visit (HOSPITAL_COMMUNITY)
Admission: RE | Admit: 2024-12-18 | Discharge: 2024-12-18 | Disposition: A | Discharge status: Home / Self Care | Source: Ambulatory Visit | Attending: Family Medicine | Admitting: Family Medicine

## 2024-12-18 ENCOUNTER — Ambulatory Visit

## 2024-12-18 DIAGNOSIS — N898 Other specified noninflammatory disorders of vagina: Secondary | ICD-10-CM | POA: Diagnosis present

## 2024-12-18 NOTE — Progress Notes (Signed)
 SUBJECTIVE:  38 y.o. female who desires a STI screen. Complains of abnormal vaginal discharge. Denies bleeding or significant pelvic pain. No UTI symptoms. Denies history of known exposure to STD.  No LMP recorded. (Menstrual status: IUD).  OBJECTIVE:  She appears well.   ASSESSMENT:  STI Screen   PLAN:  Pt offered STI blood screening-not indicated GC, chlamydia, bacterial vaginosis, yeast, and trichomonas probe sent to lab.  Treatment: To be determined once lab results are received.  Pt follow up as needed.

## 2024-12-19 LAB — CERVICOVAGINAL ANCILLARY ONLY
Bacterial Vaginitis (gardnerella): POSITIVE — AB
Candida Glabrata: NEGATIVE
Candida Vaginitis: NEGATIVE
Chlamydia: NEGATIVE
Comment: NEGATIVE
Comment: NEGATIVE
Comment: NEGATIVE
Comment: NEGATIVE
Comment: NEGATIVE
Comment: NORMAL
Neisseria Gonorrhea: NEGATIVE
Trichomonas: NEGATIVE

## 2024-12-21 ENCOUNTER — Ambulatory Visit: Payer: Self-pay | Admitting: Family Medicine

## 2024-12-21 MED ORDER — METRONIDAZOLE 500 MG PO TABS: 500.0000 mg | ORAL_TABLET | Freq: Two times a day (BID) | ORAL | 0 refills | Status: AC
